# Patient Record
Sex: Female | Born: 1970 | Race: White | Hispanic: No | Marital: Married | Smoking: Current every day smoker
Health system: Southern US, Community
[De-identification: ages and names within clinical notes are randomized; demographics above are authoritative.]

## PROBLEM LIST (undated history)

## (undated) DIAGNOSIS — R232 Flushing: Principal | ICD-10-CM

## (undated) DIAGNOSIS — F32A Depression, unspecified: Secondary | ICD-10-CM

## (undated) DIAGNOSIS — F419 Anxiety disorder, unspecified: Secondary | ICD-10-CM

## (undated) DIAGNOSIS — F329 Major depressive disorder, single episode, unspecified: Secondary | ICD-10-CM

## (undated) DIAGNOSIS — R4586 Emotional lability: Secondary | ICD-10-CM

## (undated) HISTORY — DX: Emotional lability: R45.86

## (undated) HISTORY — DX: Flushing: R23.2

## (undated) HISTORY — PX: KNEE SURGERY: SHX244

## (undated) HISTORY — PX: CHOLECYSTECTOMY: SHX55

---

## 2002-08-31 ENCOUNTER — Emergency Department (HOSPITAL_COMMUNITY): Admission: EM | Admit: 2002-08-31 | Discharge: 2002-08-31 | Payer: Self-pay | Admitting: Internal Medicine

## 2007-10-23 ENCOUNTER — Ambulatory Visit (HOSPITAL_COMMUNITY): Admission: RE | Admit: 2007-10-23 | Discharge: 2007-10-23 | Payer: Self-pay | Admitting: Family Medicine

## 2007-10-30 ENCOUNTER — Emergency Department (HOSPITAL_COMMUNITY): Admission: EM | Admit: 2007-10-30 | Discharge: 2007-10-30 | Payer: Self-pay | Admitting: Emergency Medicine

## 2007-10-30 IMAGING — CR DG FOOT COMPLETE 3+V*R*
3 series · 3 of 3 positions shown · non-contrast
Comparison: none

HISTORY: Pain and swelling

RIGHT FOOT 3 VIEWS:
Soft tissue swelling medially at hindfoot.
Bone mineralization normal.
Joint spaces preserved.
No fracture, dislocation, or bone destruction.

[view not recorded (1 of 3)]
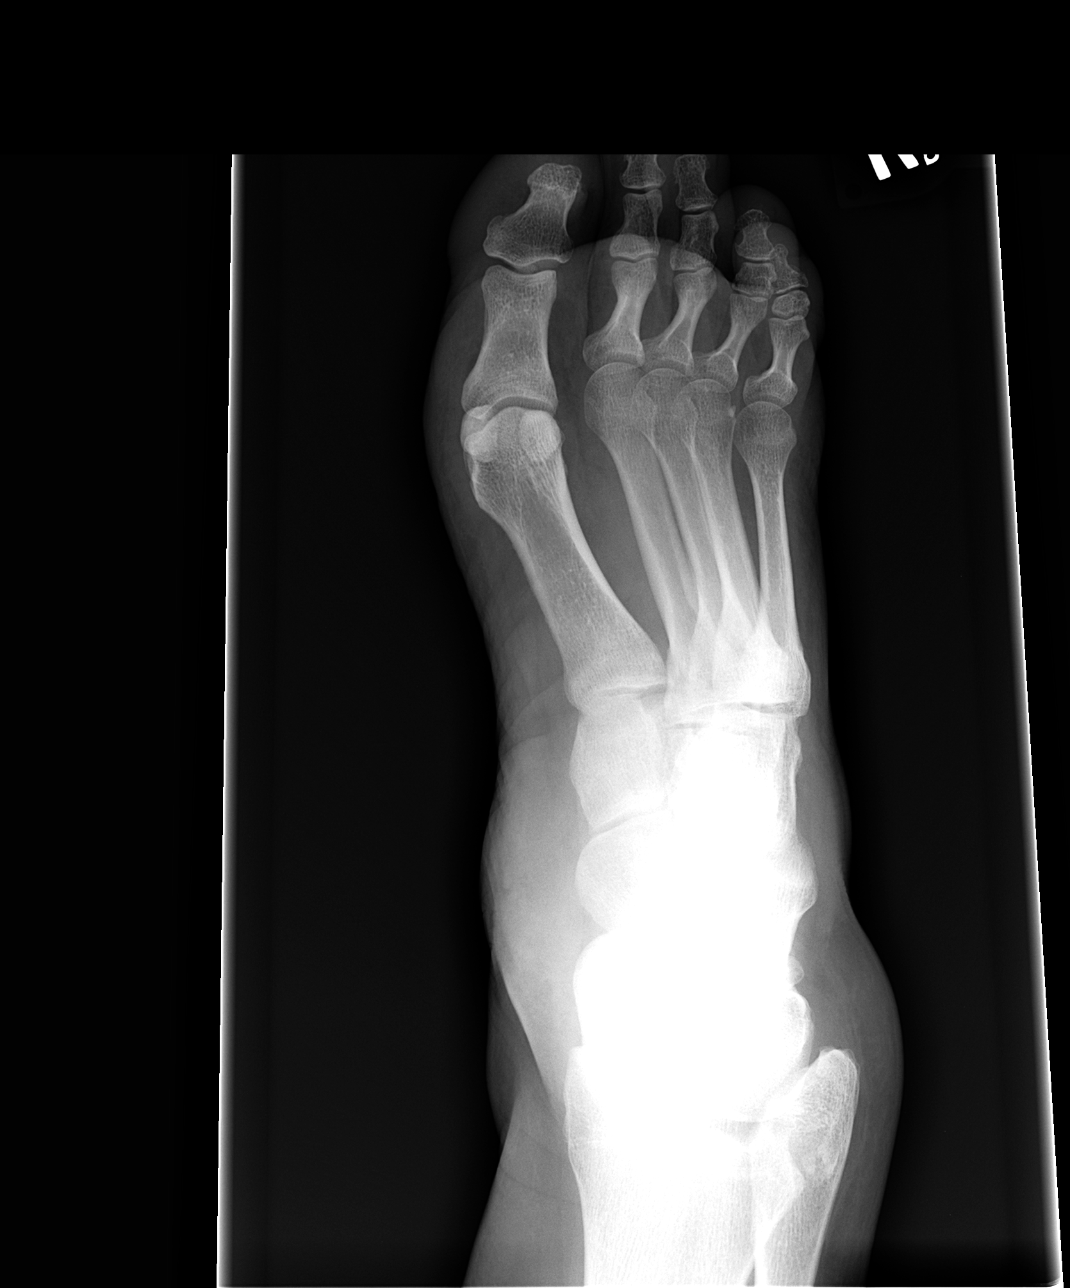

[view not recorded (2 of 3)]
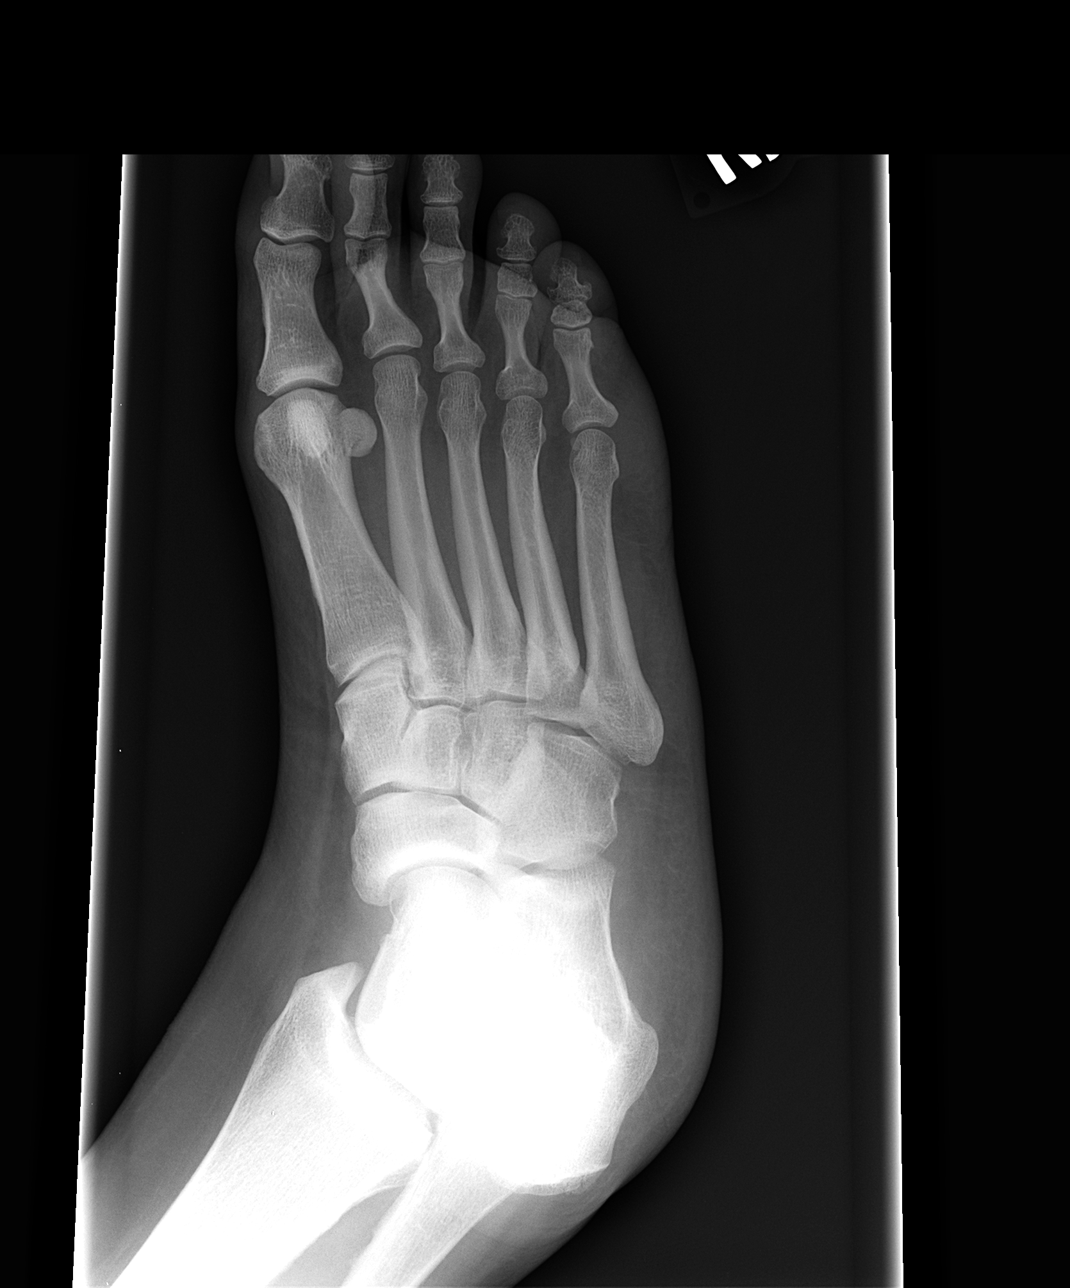

[view not recorded (3 of 3)]
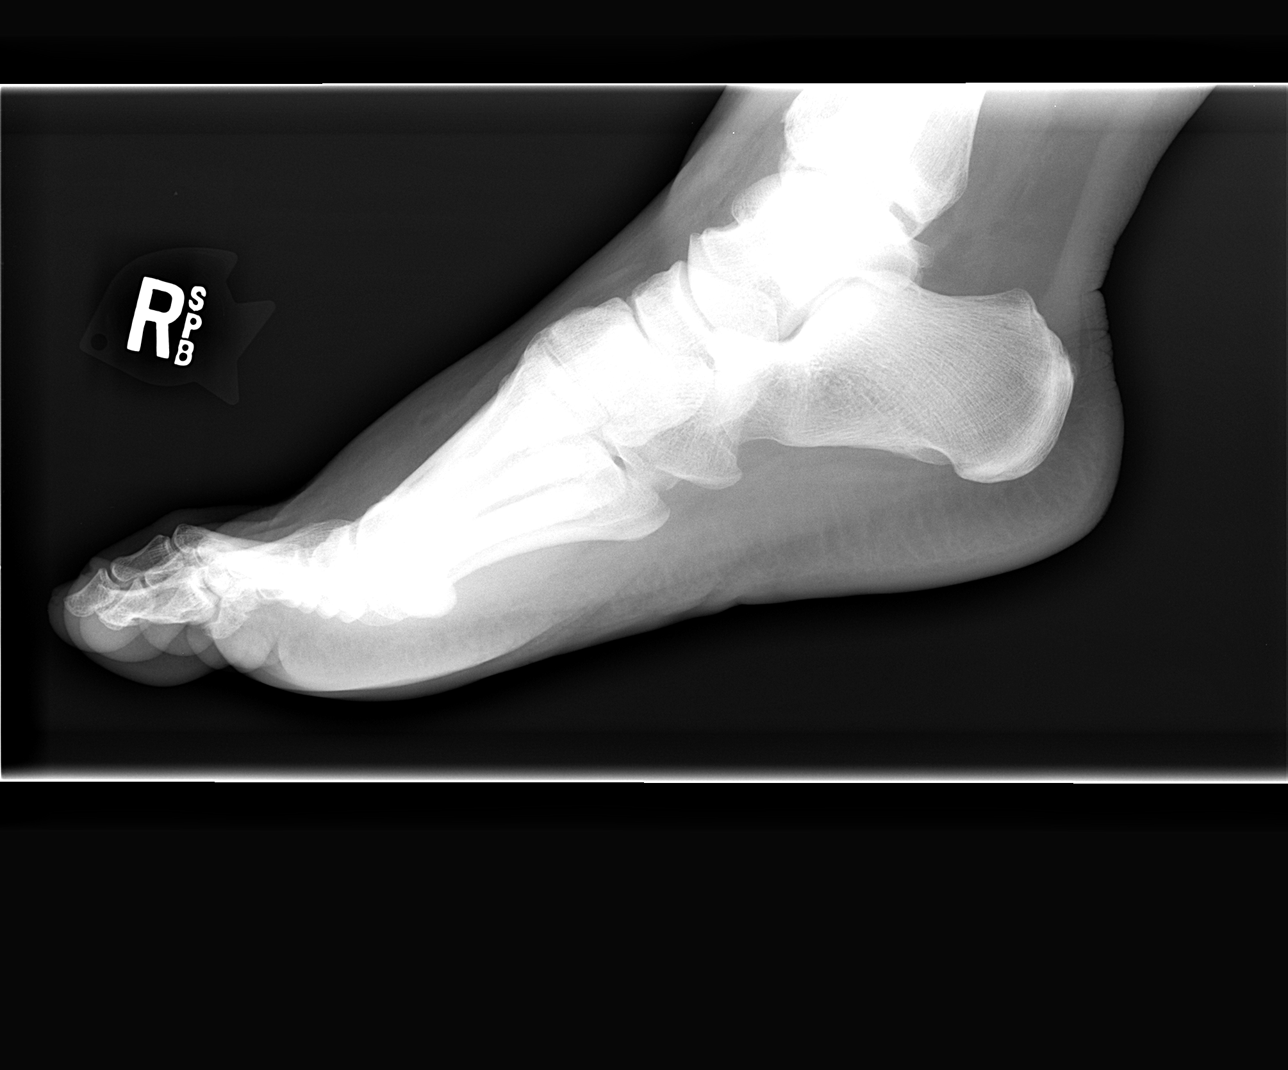

[3 of 3 positions shown; findings below may reference images not displayed]

IMPRESSION: Soft tissue swelling right foot without acute bony abnormality.

RIGHT ANKLE 3 VIEWS:

Soft tissue swelling medially and laterally at right ankle.
Ankle mortise intact.
Bone mineralization normal.
No fracture, dislocation, or bone destruction.
IMPRESSION: No acute bony abnormality right ankle.

## 2007-10-30 IMAGING — CR DG ANKLE COMPLETE 3+V*R*
3 series · 3 of 3 positions shown · non-contrast
Comparison: none

HISTORY: Pain and swelling

RIGHT FOOT 3 VIEWS:
Soft tissue swelling medially at hindfoot.
Bone mineralization normal.
Joint spaces preserved.
No fracture, dislocation, or bone destruction.

[view not recorded (1 of 3)]
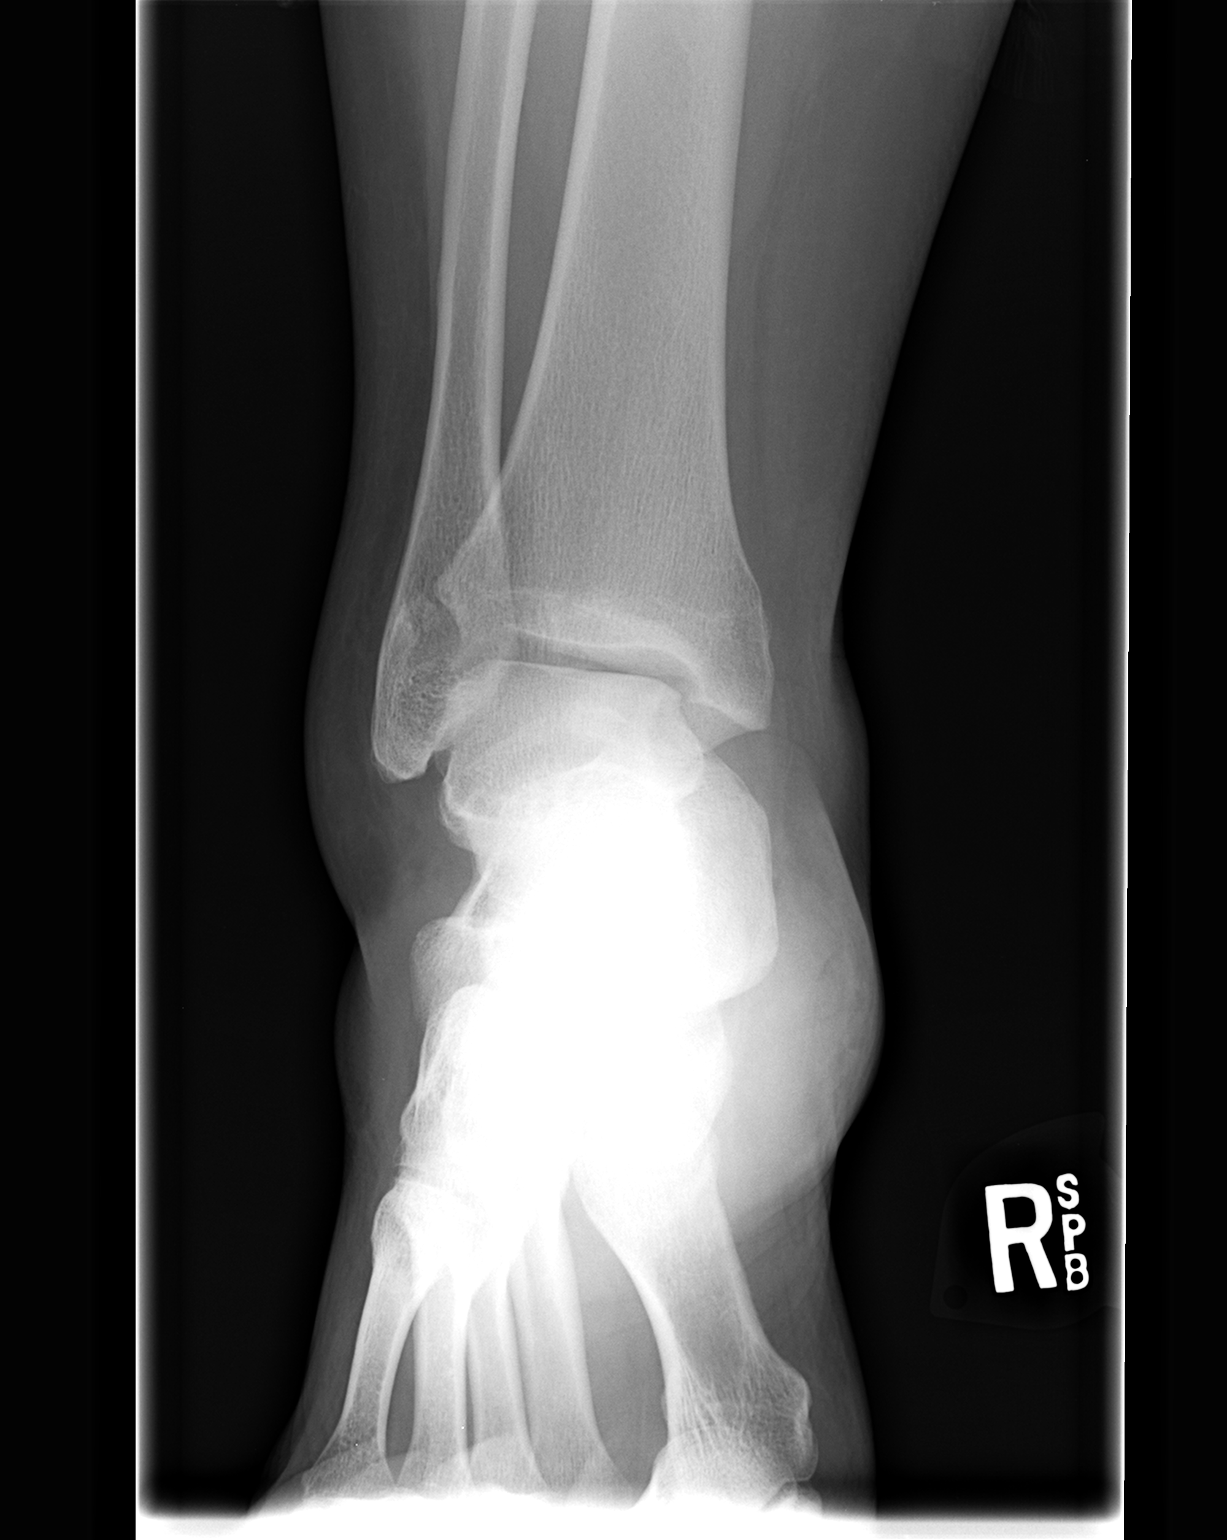

[view not recorded (2 of 3)]
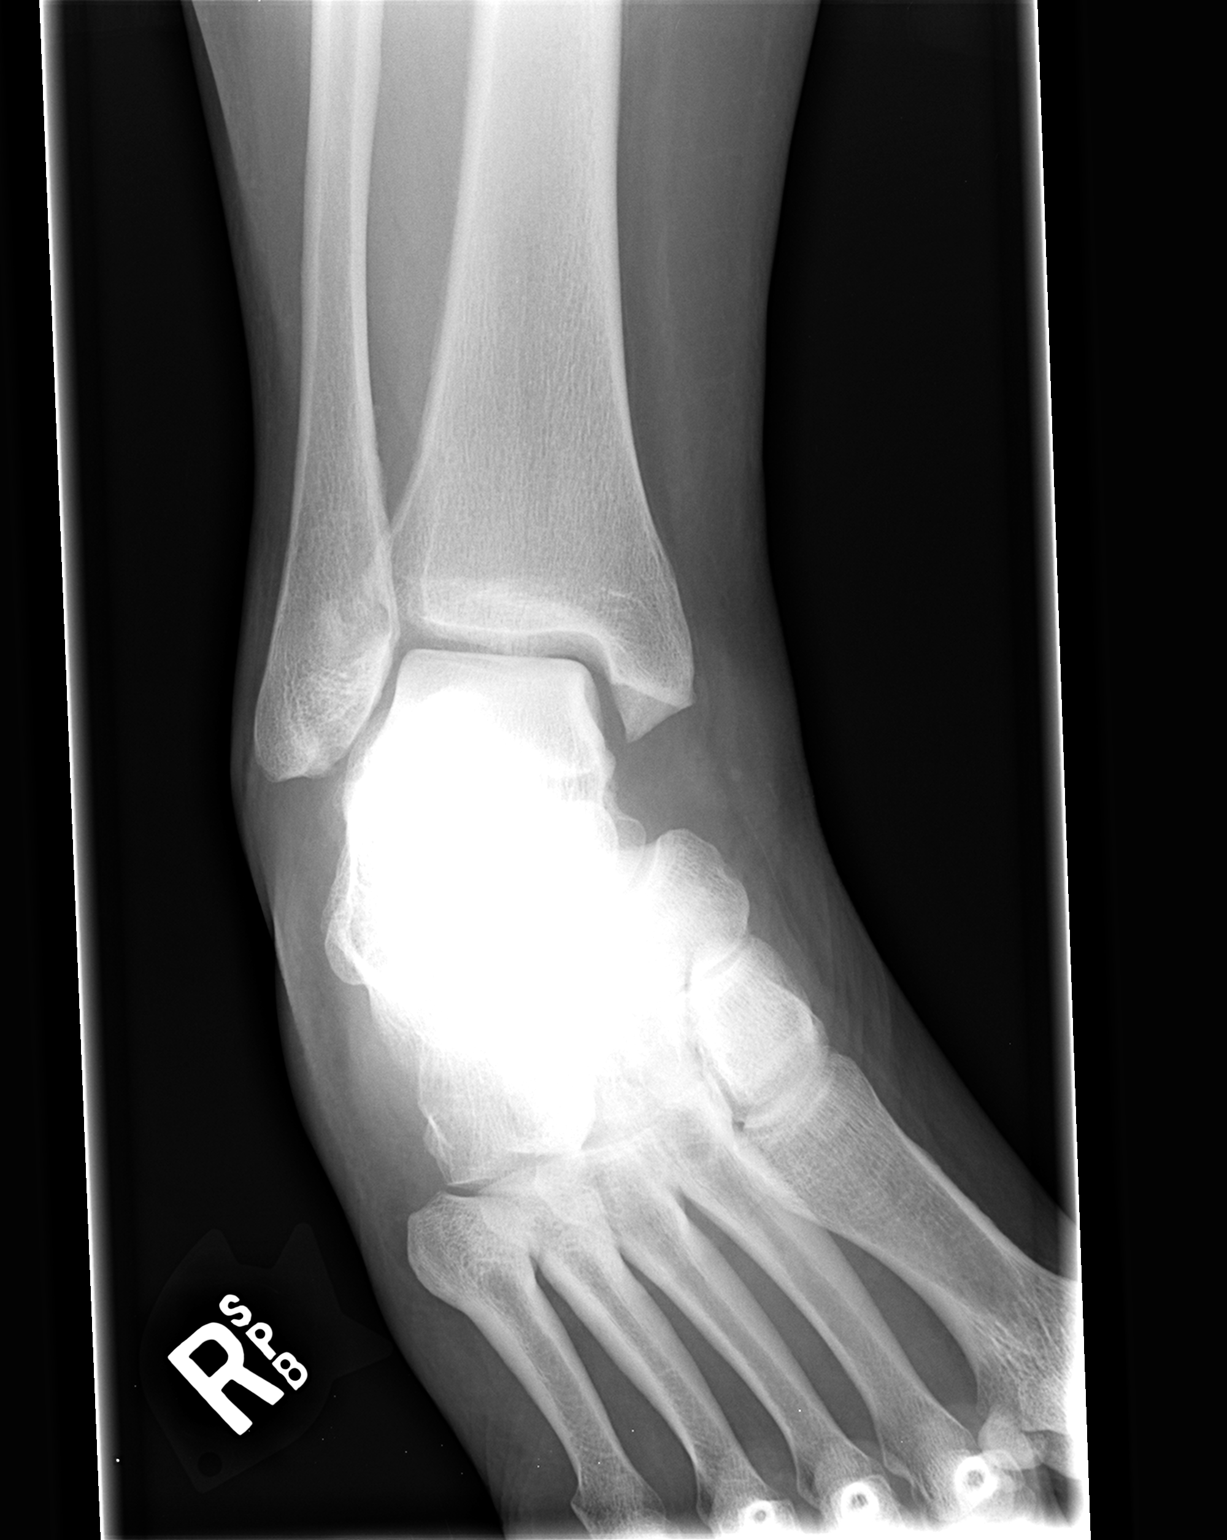

[view not recorded (3 of 3)]
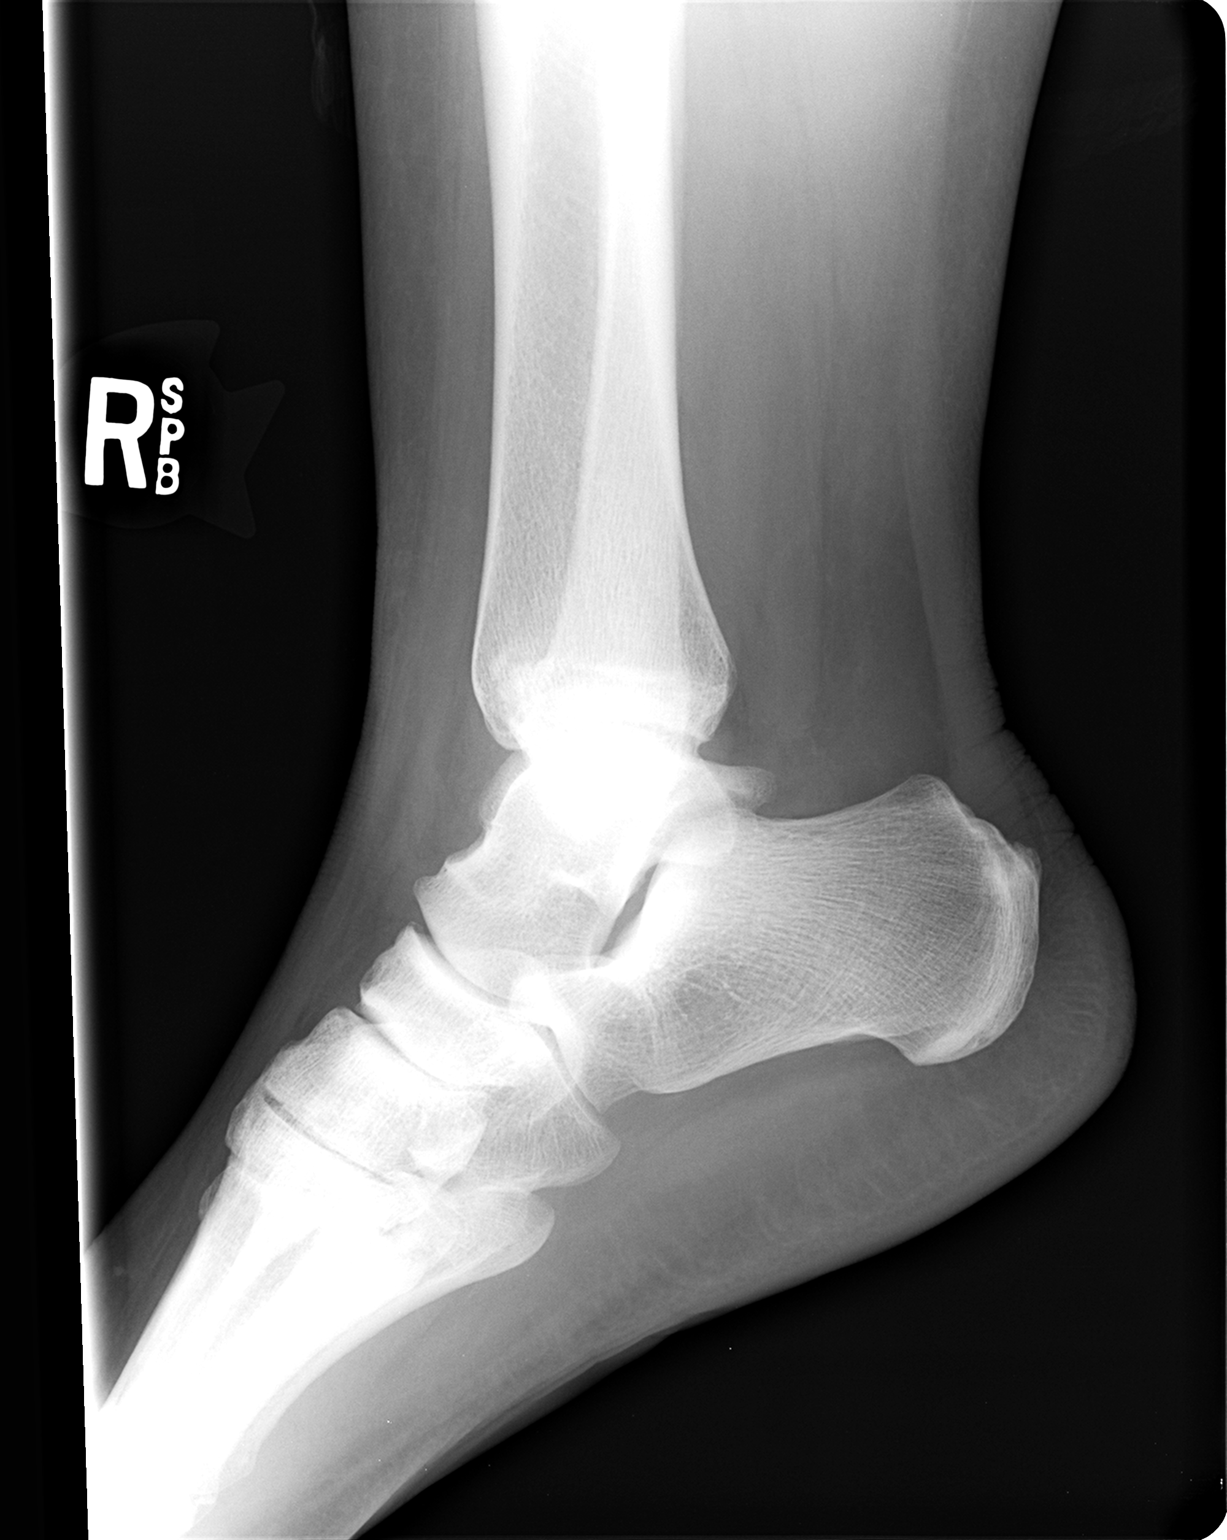

[3 of 3 positions shown; findings below may reference images not displayed]

IMPRESSION: Soft tissue swelling right foot without acute bony abnormality.

RIGHT ANKLE 3 VIEWS:

Soft tissue swelling medially and laterally at right ankle.
Ankle mortise intact.
Bone mineralization normal.
No fracture, dislocation, or bone destruction.
IMPRESSION: No acute bony abnormality right ankle.

## 2009-12-02 ENCOUNTER — Ambulatory Visit (HOSPITAL_COMMUNITY): Admission: RE | Admit: 2009-12-02 | Discharge: 2009-12-02 | Payer: Self-pay | Admitting: Family Medicine

## 2009-12-02 IMAGING — CR DG CHEST 2V
3 series · 3 of 3 positions shown · non-contrast
Comparison: [DATE]

CLINICAL DATA: Cough, right side wheezing

CHEST - 2 VIEW

[view not recorded (1 of 3)]
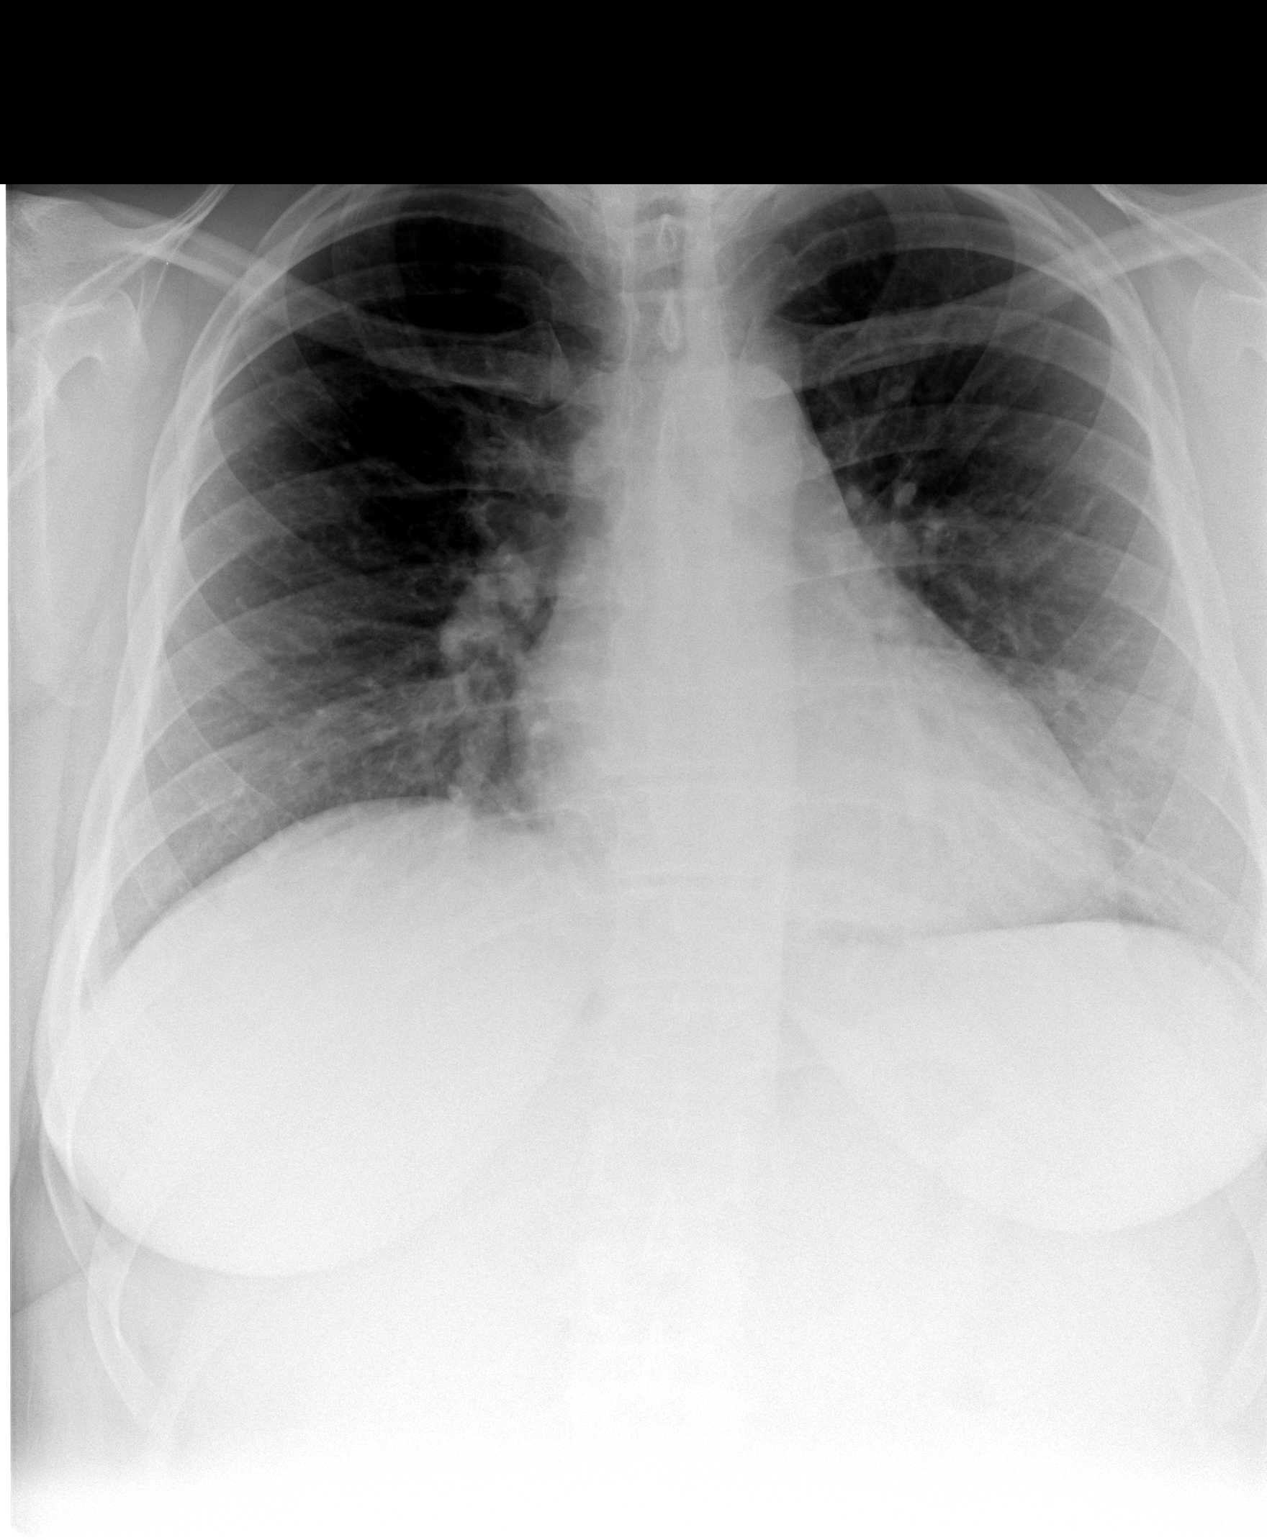

[view not recorded (2 of 3)]
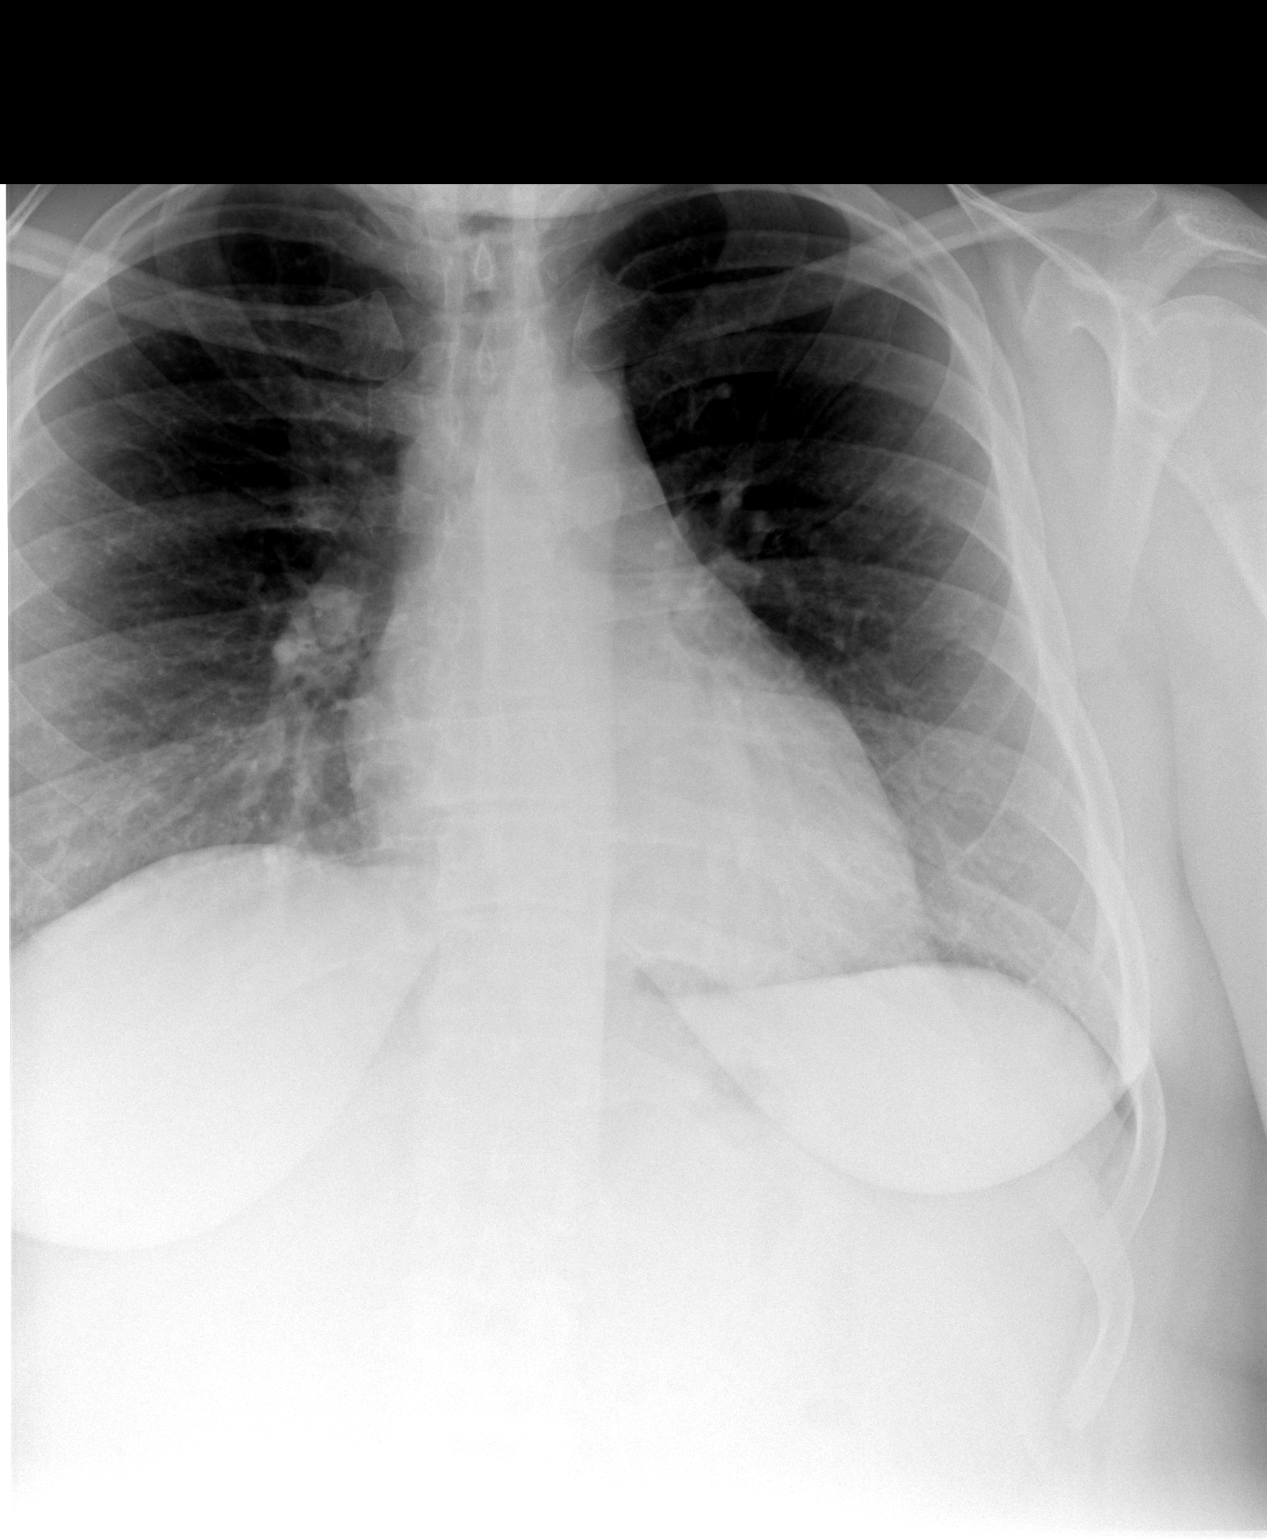

[view not recorded (3 of 3)]
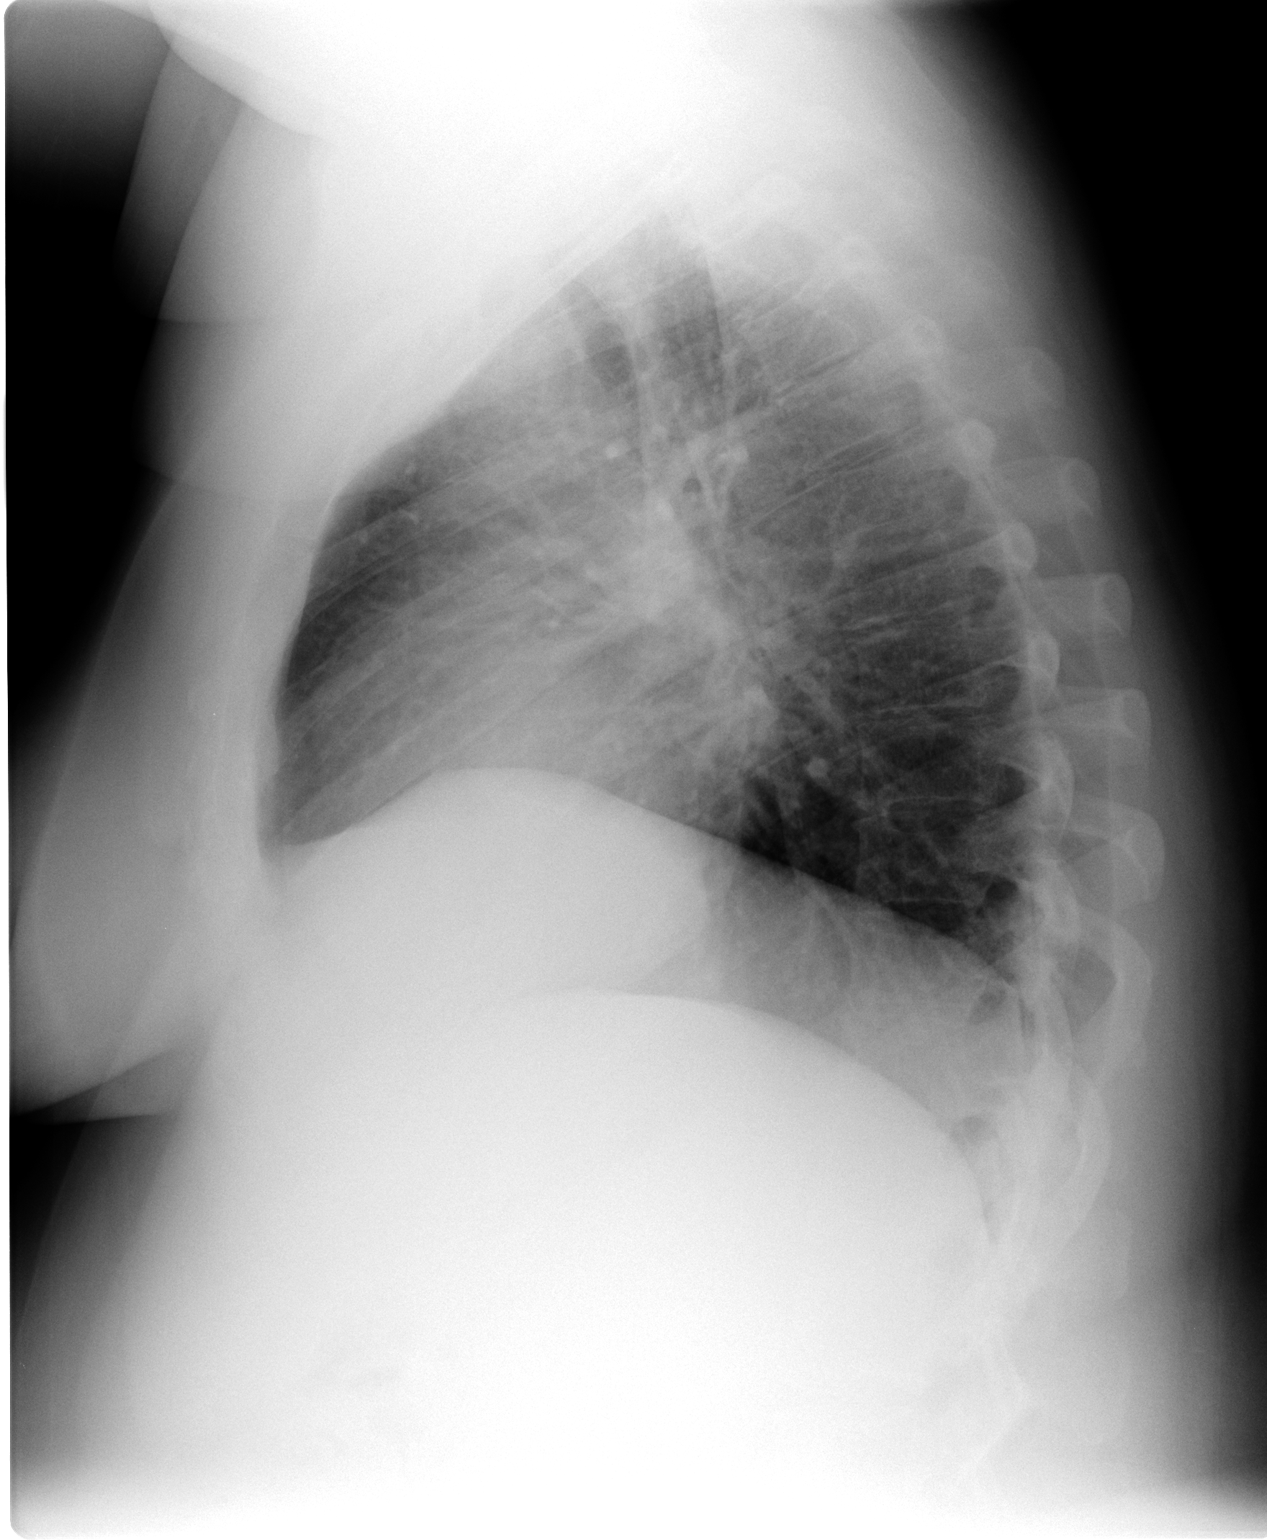

[3 of 3 positions shown; findings below may reference images not displayed]

FINDINGS: Upper normal heart size.
Normal mediastinal contours and pulmonary vascularity.
Minimal chronic peribronchial thickening.
No pulmonary infiltrate or pleural effusion.
No pneumothorax.
Bones unremarkable.
IMPRESSION: Minimal chronic bronchitic changes.

## 2009-12-20 ENCOUNTER — Other Ambulatory Visit: Admission: RE | Admit: 2009-12-20 | Discharge: 2009-12-20 | Payer: Self-pay | Admitting: Obstetrics & Gynecology

## 2010-02-17 ENCOUNTER — Emergency Department (HOSPITAL_COMMUNITY): Admission: EM | Admit: 2010-02-17 | Discharge: 2010-02-17 | Payer: Self-pay | Admitting: Emergency Medicine

## 2010-02-17 IMAGING — CR DG CHEST 2V
2 series · 2 of 2 positions shown · non-contrast
Comparison: [DATE].

CLINICAL DATA: Chest pain.  Shortness of breath.

CHEST - 2 VIEW

[view not recorded (1 of 2)]
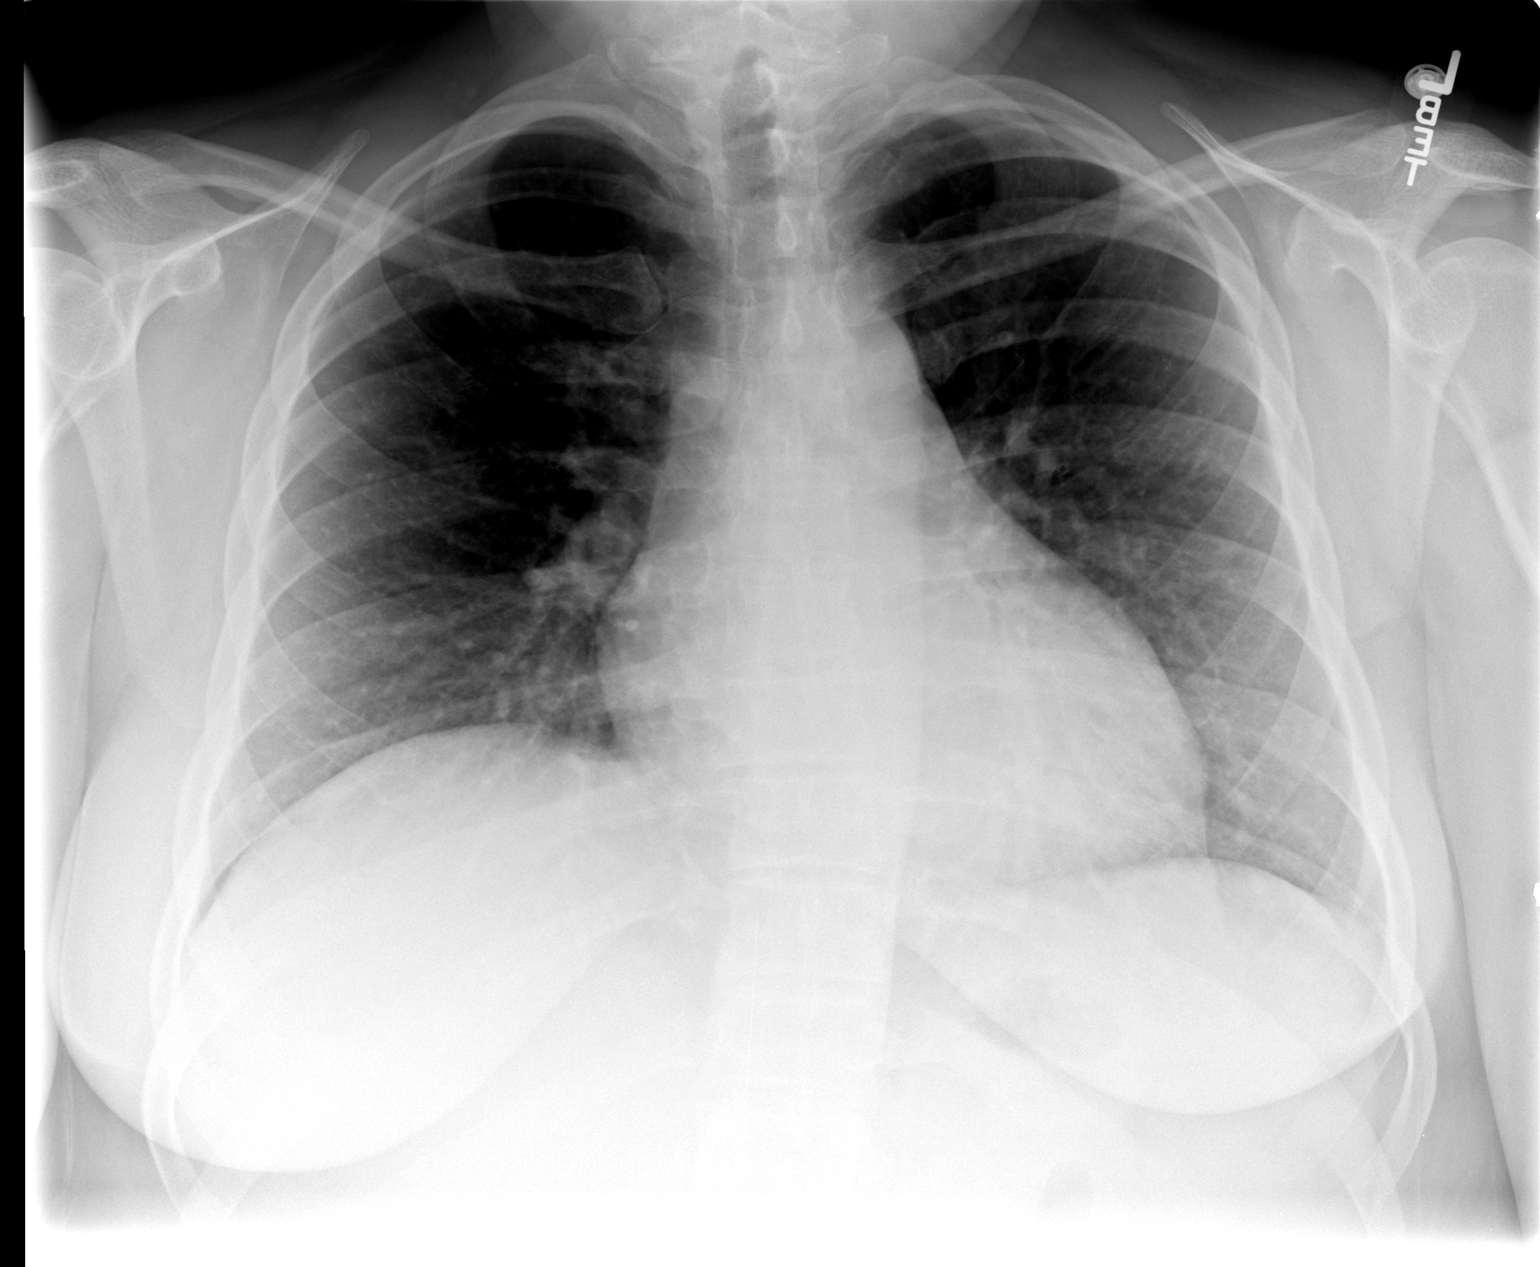

[view not recorded (2 of 2)]
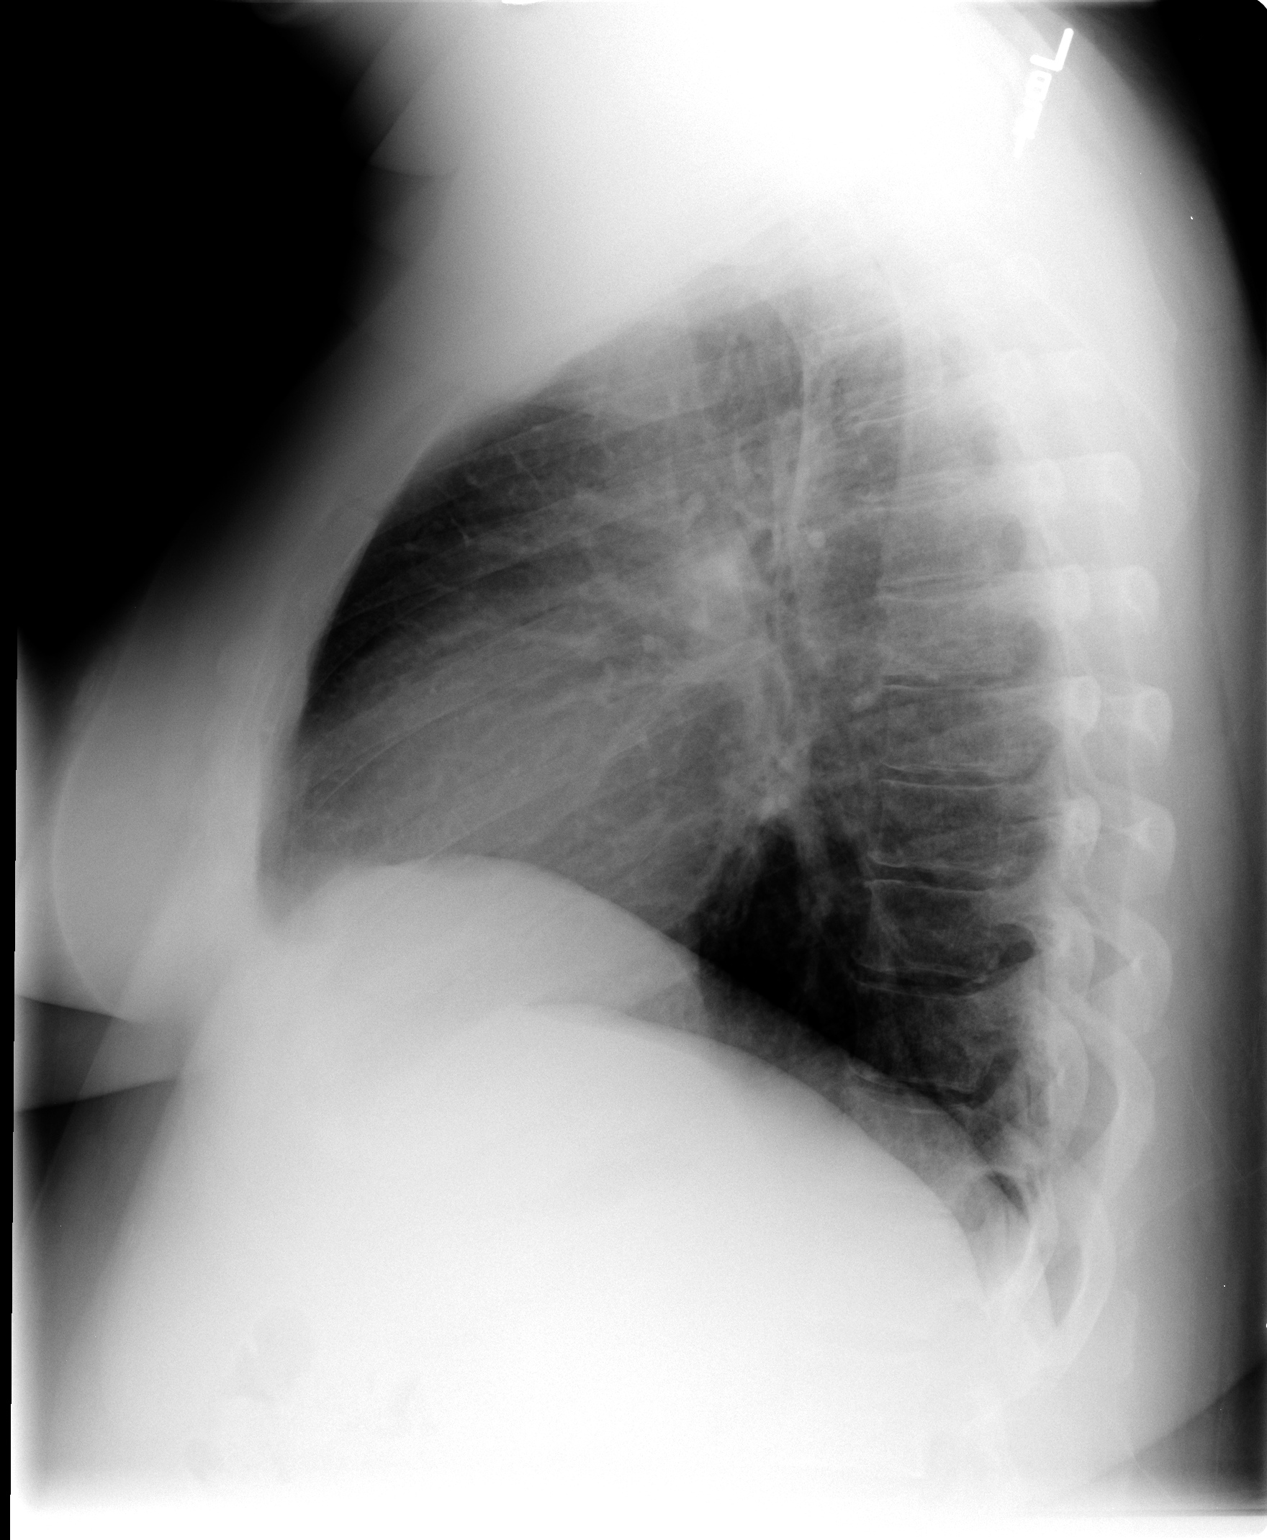

[2 of 2 positions shown; findings below may reference images not displayed]

FINDINGS: The cardiopericardial silhouette is exaggerated by low
lung volumes.  No focal airspace disease is evident.  The
visualized soft tissues and bony thorax are unremarkable.
IMPRESSION: No acute cardiopulmonary disease.

## 2010-08-19 ENCOUNTER — Emergency Department (HOSPITAL_COMMUNITY)
Admission: EM | Admit: 2010-08-19 | Discharge: 2010-08-19 | Payer: Self-pay | Source: Home / Self Care | Admitting: Emergency Medicine

## 2010-08-19 IMAGING — US US ABDOMEN COMPLETE
1 series · 13 of 25 positions shown · non-contrast
Comparison: None

CLINICAL DATA: Abdominal pain, vomiting

ULTRASOUND ABDOMEN:
TECHNIQUE: Sonography of upper abdominal structures was performed.

[Series 1: us abdomen complete · 0.31mm/px · 13 of 89 slices shown]
[im 1/89]
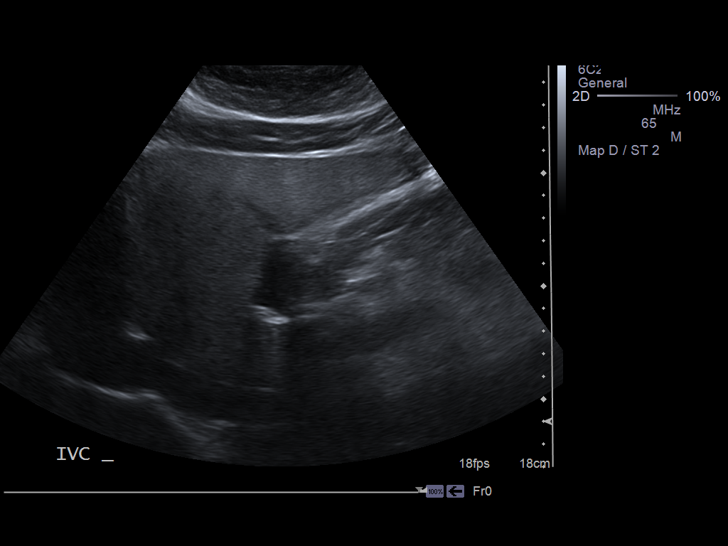
[im 8/89]
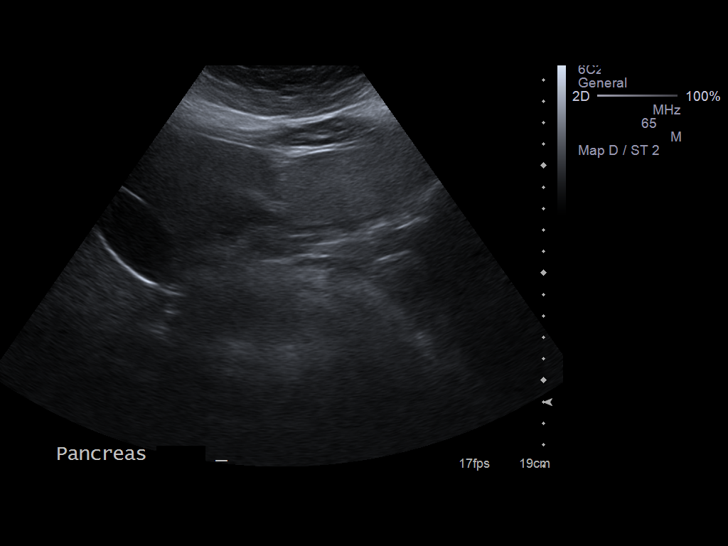
[im 15/89]
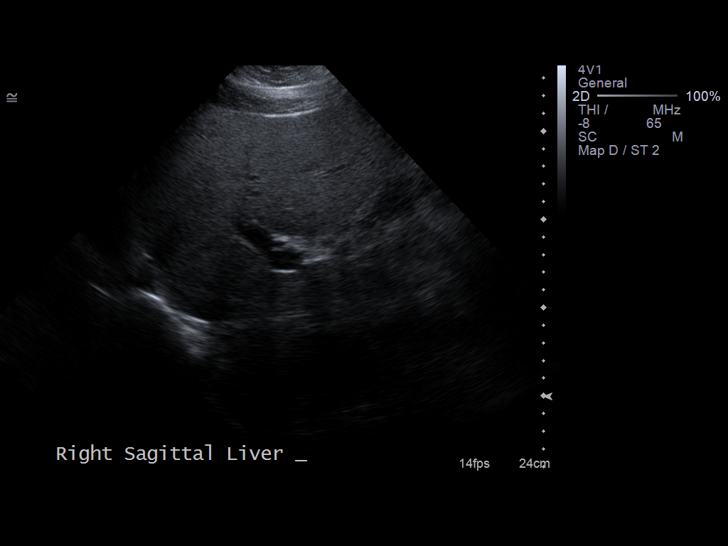
[im 23/89]
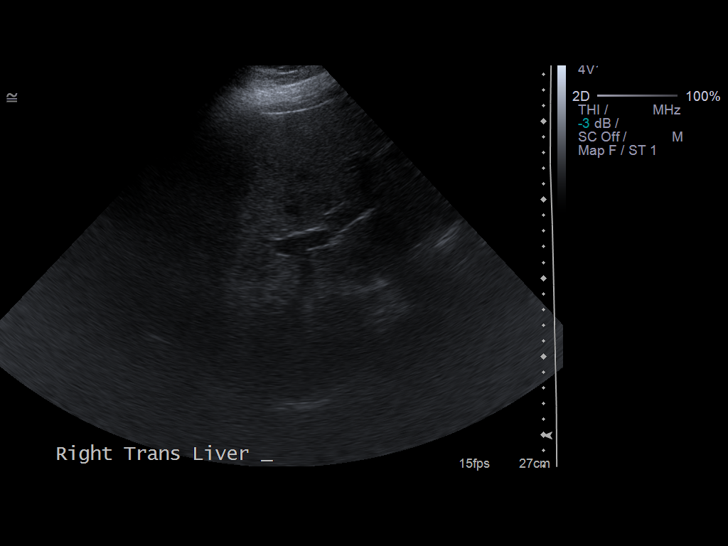
[im 30/89]
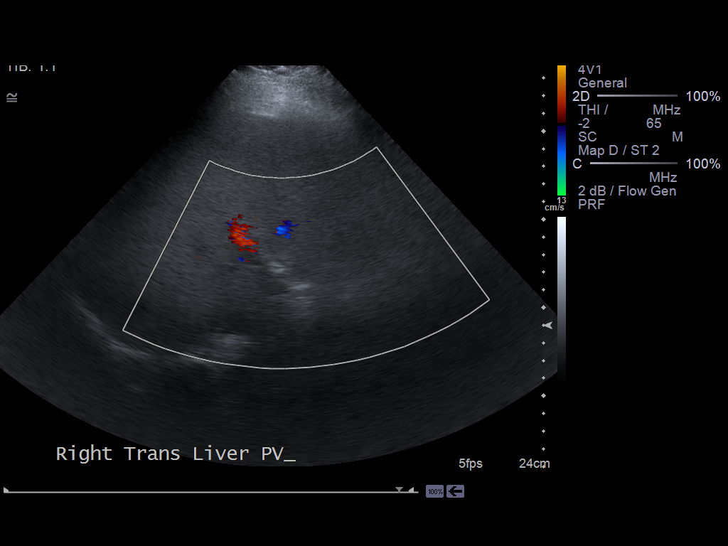
[im 37/89]
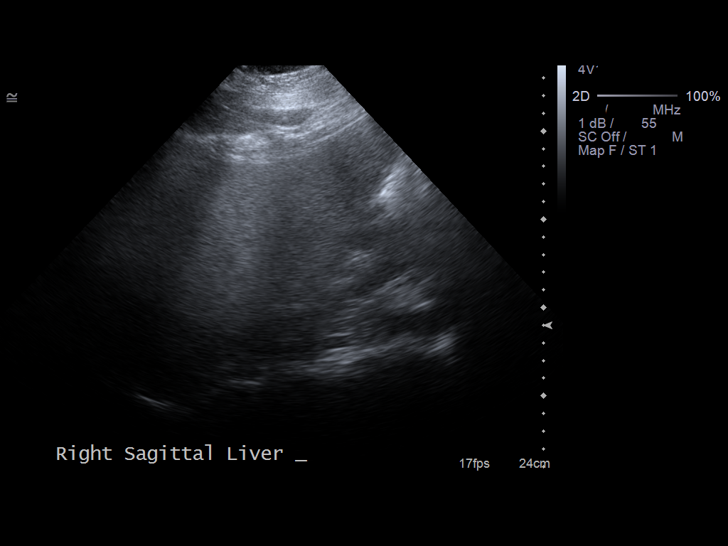
[im 45/89]
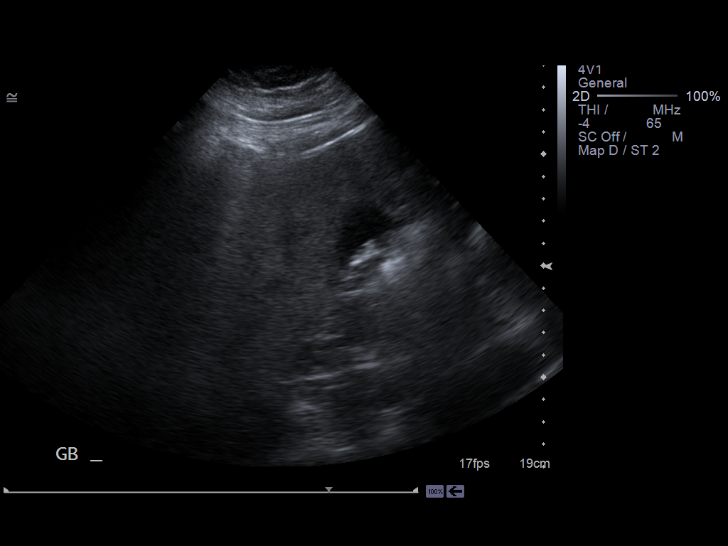
[im 52/89]
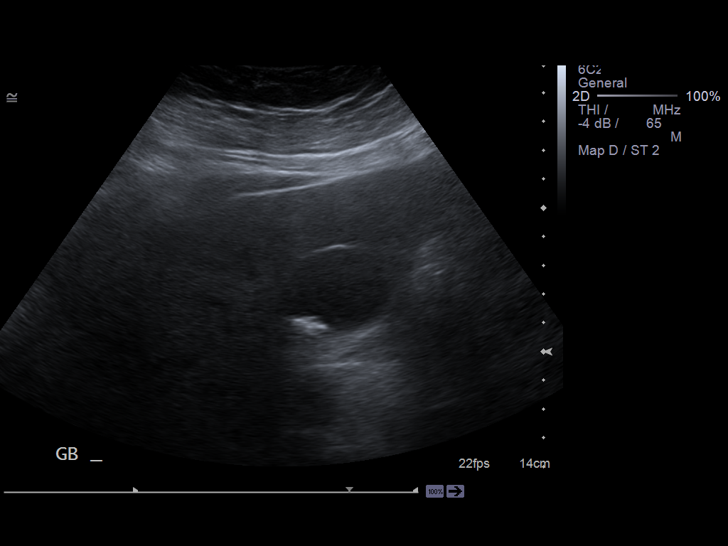
[im 59/89]
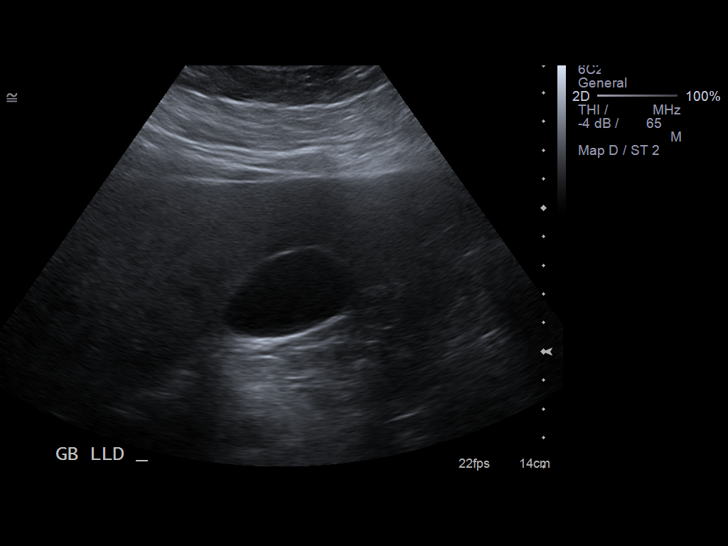
[im 67/89]
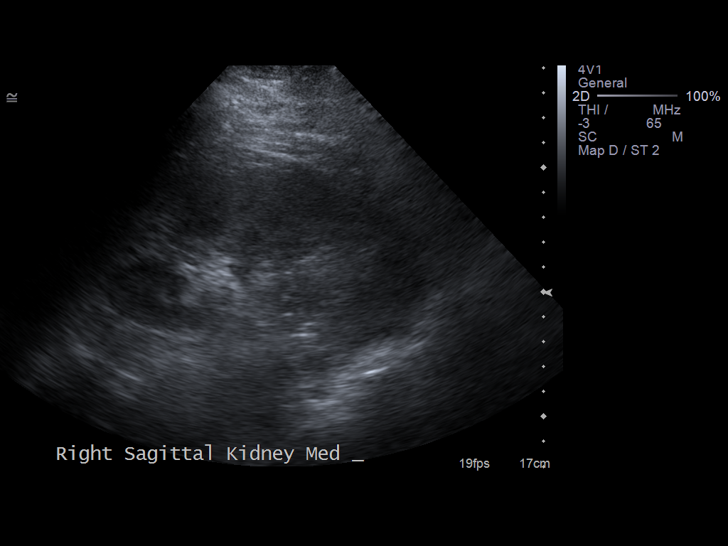
[im 74/89]
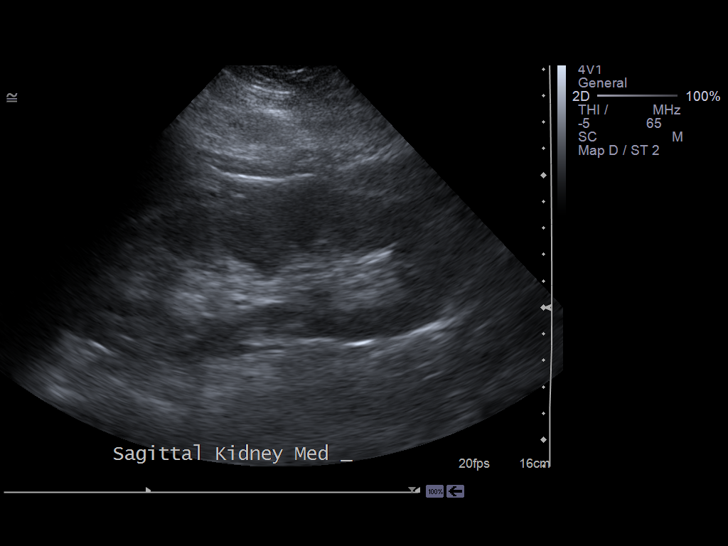
[im 81/89]
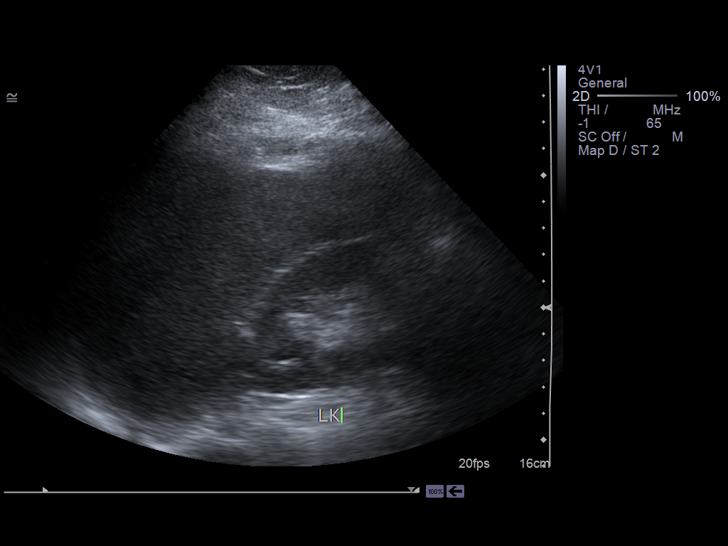
[im 89/89]
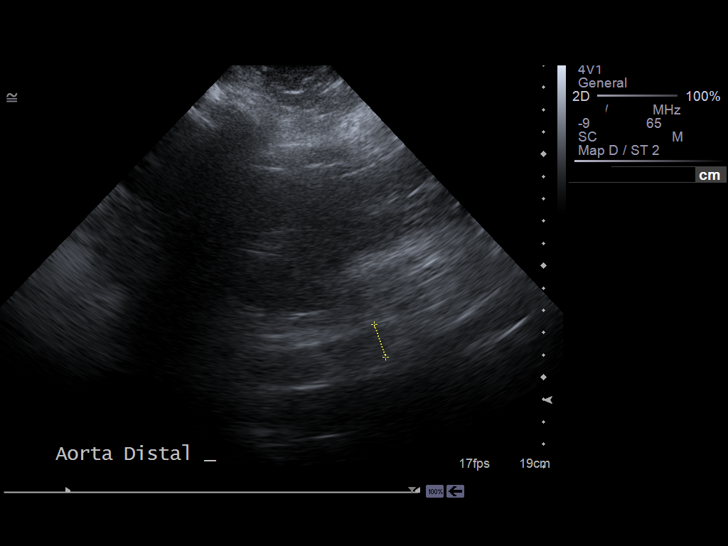

[13 of 25 positions shown; findings below may reference images not displayed]

Gallbladder:  Dependent echogenic foci with minimal scattered
shadowing, question tiny dependent nonshadowing calculi versus
sludge.  No gallbladder wall thickening, pericholecystic fluid, or
sonographic Murphy sign.

Common bile duct:  Normal caliber 2 mm diameter.

Liver:  Echogenic, likely fatty infiltration, though this can be
seen with cirrhosis and certain infiltrative disorders.  Suboptimal
sound penetration through the liver, limiting assessment of
internal detail.  No gross focal hepatic abnormality.

IVC:  Unremarkable

Pancreas:  Inadequately visualized due to bowel gas.

Spleen:  Prominent size at 13.8 cm length with calculated volume of
[BR] ml.  No focal mass mass or nodule.

Right kidney:  13.5 cm length. Normal morphology without mass or
hydronephrosis.

Left kidney:  15.0 cm length. Normal morphology without mass or
hydronephrosis.

Aorta:  Suboptimally visualized due to bowel gas, proximal portion
of abdominal aorta normal appearance.

Other:  No free fluid
IMPRESSION: Probable fatty infiltration of liver.
Suboptimal assessment of intrahepatic detail due to poor sound
transmission.
Dependent nonshadowing echogenic foci within gallbladder, question
tiny nonshadowing calculi versus sludge.
Prominent renal sizes without focal abnormality.
Mild splenomegaly.
Suboptimal visualization of pancreas and aorta as above.

## 2010-08-23 ENCOUNTER — Encounter (HOSPITAL_COMMUNITY)
Admission: RE | Admit: 2010-08-23 | Discharge: 2010-09-22 | Payer: Self-pay | Source: Home / Self Care | Admitting: Emergency Medicine

## 2010-08-23 IMAGING — NM NM HEPATO W/GB/PHARM/[PERSON_NAME]
2 series · 12 of 12 positions shown · non-contrast
Comparison: Ultrasound [DATE].

CLINICAL DATA: Abdominal pain.  Nausea.  Vomiting.

NUCLEAR MEDICINE HEPATOBILIARY IMAGING WITH GALLBLADDER EF
TECHNIQUE: Sequential images of the abdomen were obtained [DATE]
minutes following intravenous administration of
radiopharmaceutical. After slow intravenous infusion of 3.0 uCg
Cholecystokinin, gallbladder ejection fraction was determined.
Radiopharmaceutical: 4.7 mCi [FO] Choletec

[Series 1: hida · 3.20mm/px · 6 of 30 frames shown (1 of 2)]
[frame 3/30]
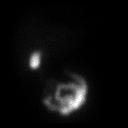
[frame 8/30]
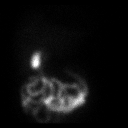
[frame 13/30]
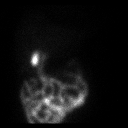
[frame 18/30]
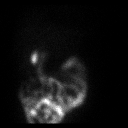
[frame 23/30]
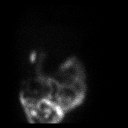
[frame 28/30]
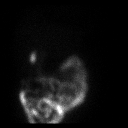

[Series 1: hida · 3.20mm/px · 6 of 60 frames shown (2 of 2)]
[frame 6/60]
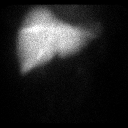
[frame 16/60]
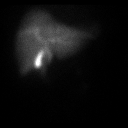
[frame 26/60]
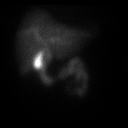
[frame 36/60]
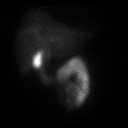
[frame 46/60]
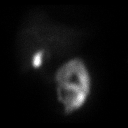
[frame 56/60]
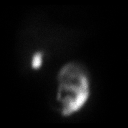

[12 of 12 positions shown; findings below may reference images not displayed]

FINDINGS: There is prompt visualization of hepatic activity. There
is prompt visualization of the common bile duct. Subsequently
intestinal activity was identified.

The gallbladder began to visualize at 9 minutes.

During CCK infusion the gallbladder contracted 78.7%. 30% or
greater is normal range.

During CCK infusion the patient reported  experiencing abdominal
pain and pressure sensation in the abdomen.
IMPRESSION: There is demonstration of patency of the common bile duct and the
cystic duct. There is no evidence of cholecystitis.

During CCK infusion the gallbladder contracted 78.7%. 30% or
greater is normal range.

During CCK infusion the patient reported  experiencing abdominal
pain and pressure sensation in the abdomen.

## 2010-09-02 ENCOUNTER — Ambulatory Visit (HOSPITAL_COMMUNITY)
Admission: RE | Admit: 2010-09-02 | Discharge: 2010-09-02 | Payer: Self-pay | Source: Home / Self Care | Admitting: General Surgery

## 2010-09-02 IMAGING — CR DG UGI W/ HIGH DENSITY W/KUB
4 series · 4 of 4 positions shown · non-contrast
Comparison: No similar prior study is available for comparison.

CLINICAL DATA: Sensation of food sticking, difficulty swallowing.
Abdominal pain.  Preoperative for cholecystectomy.

UPPER GI SERIES WITH KUB
TECHNIQUE: Routine upper GI series was performed with thin and
high density barium.
Fluoroscopy Time: 3.1 minutes

[view not recorded (1 of 4)]
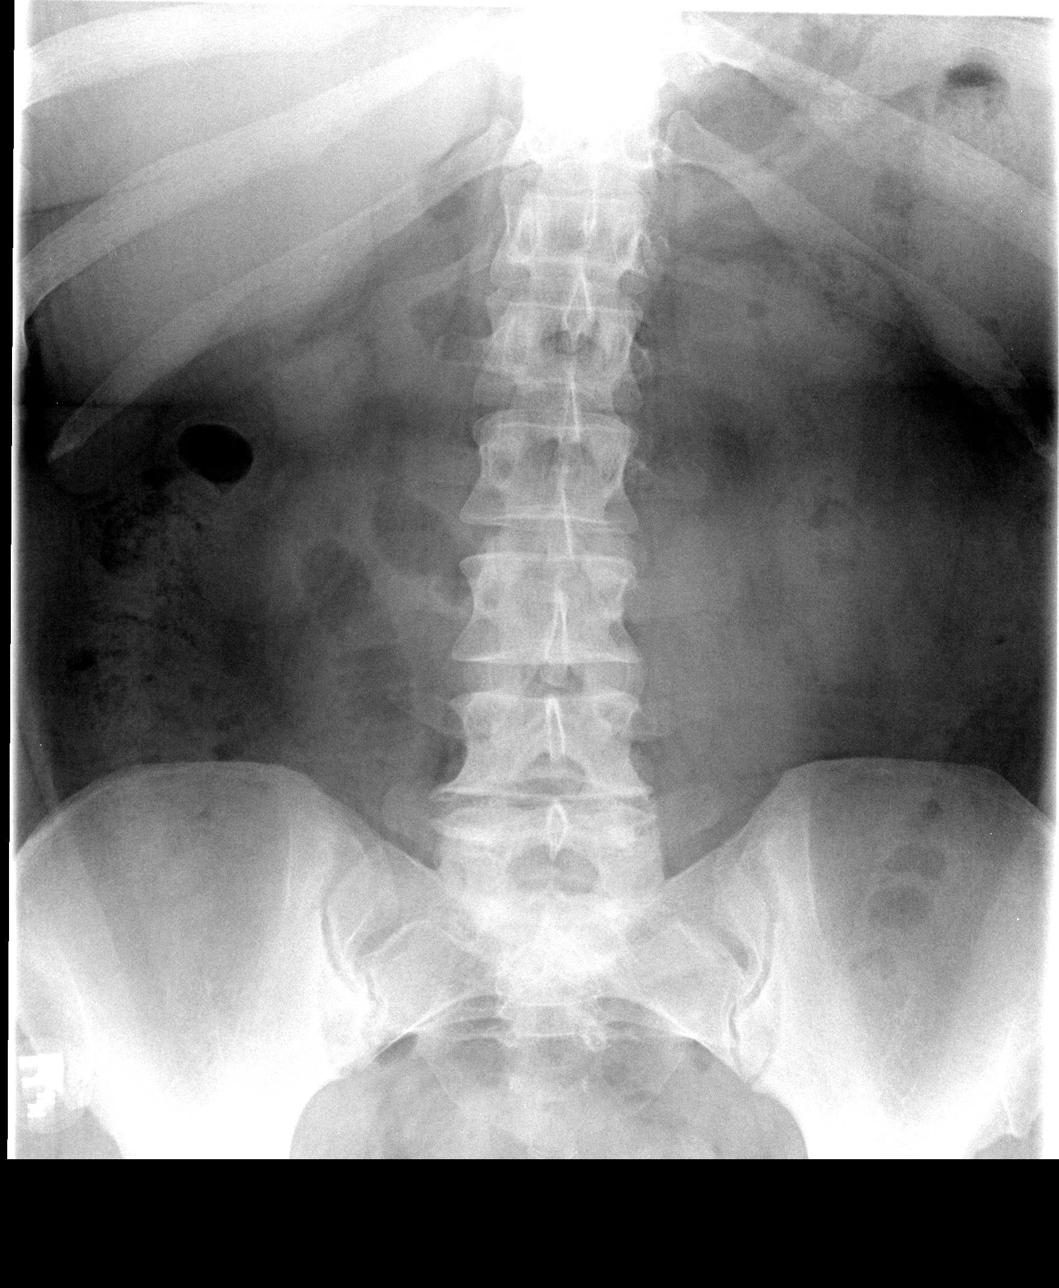

[view not recorded (2 of 4)]
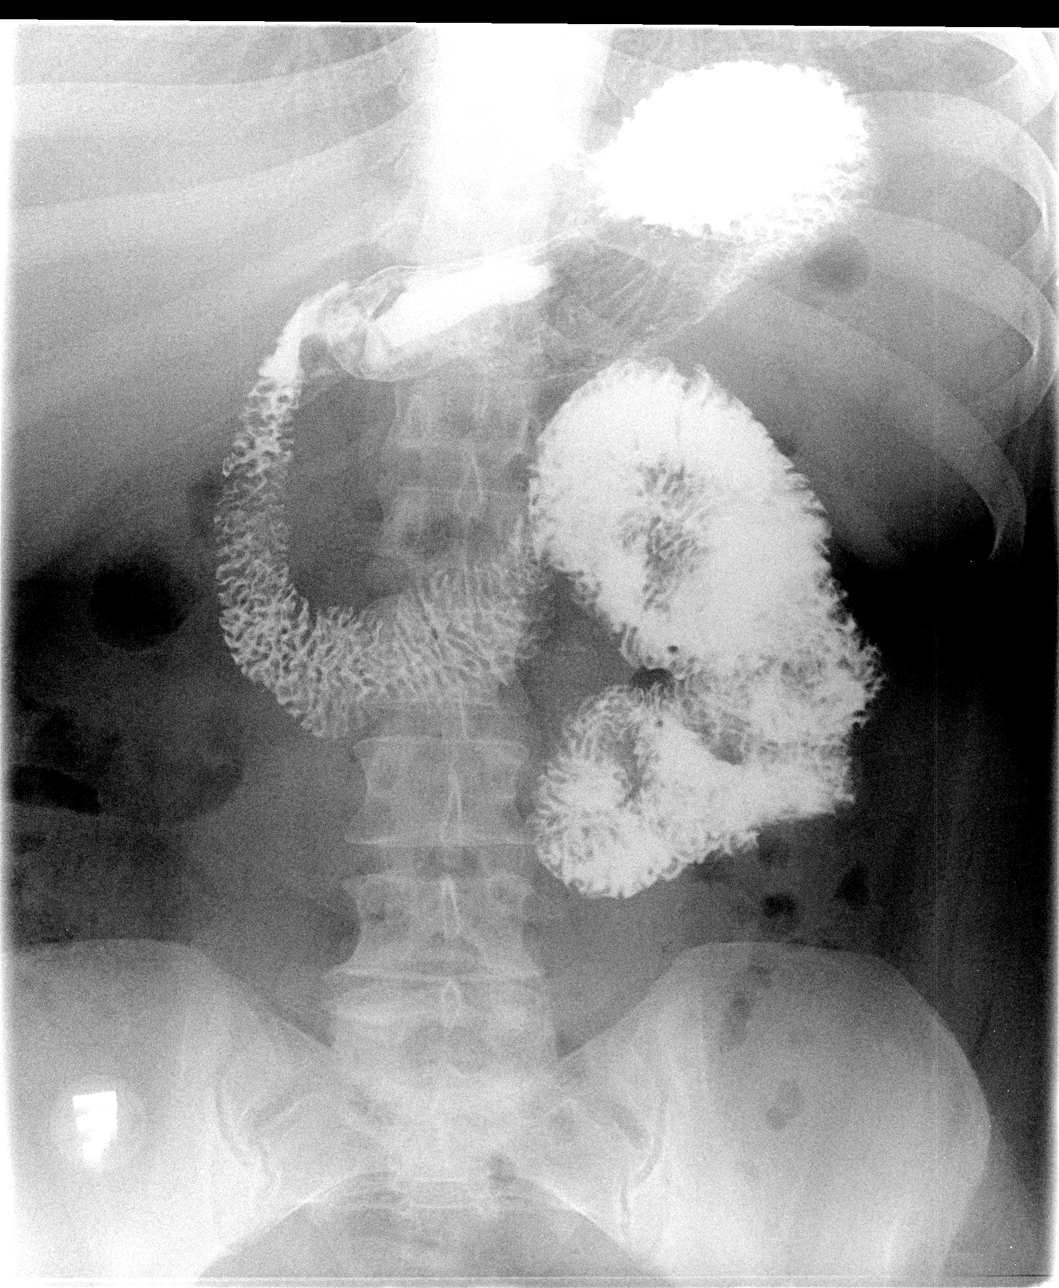

[view not recorded (3 of 4)]
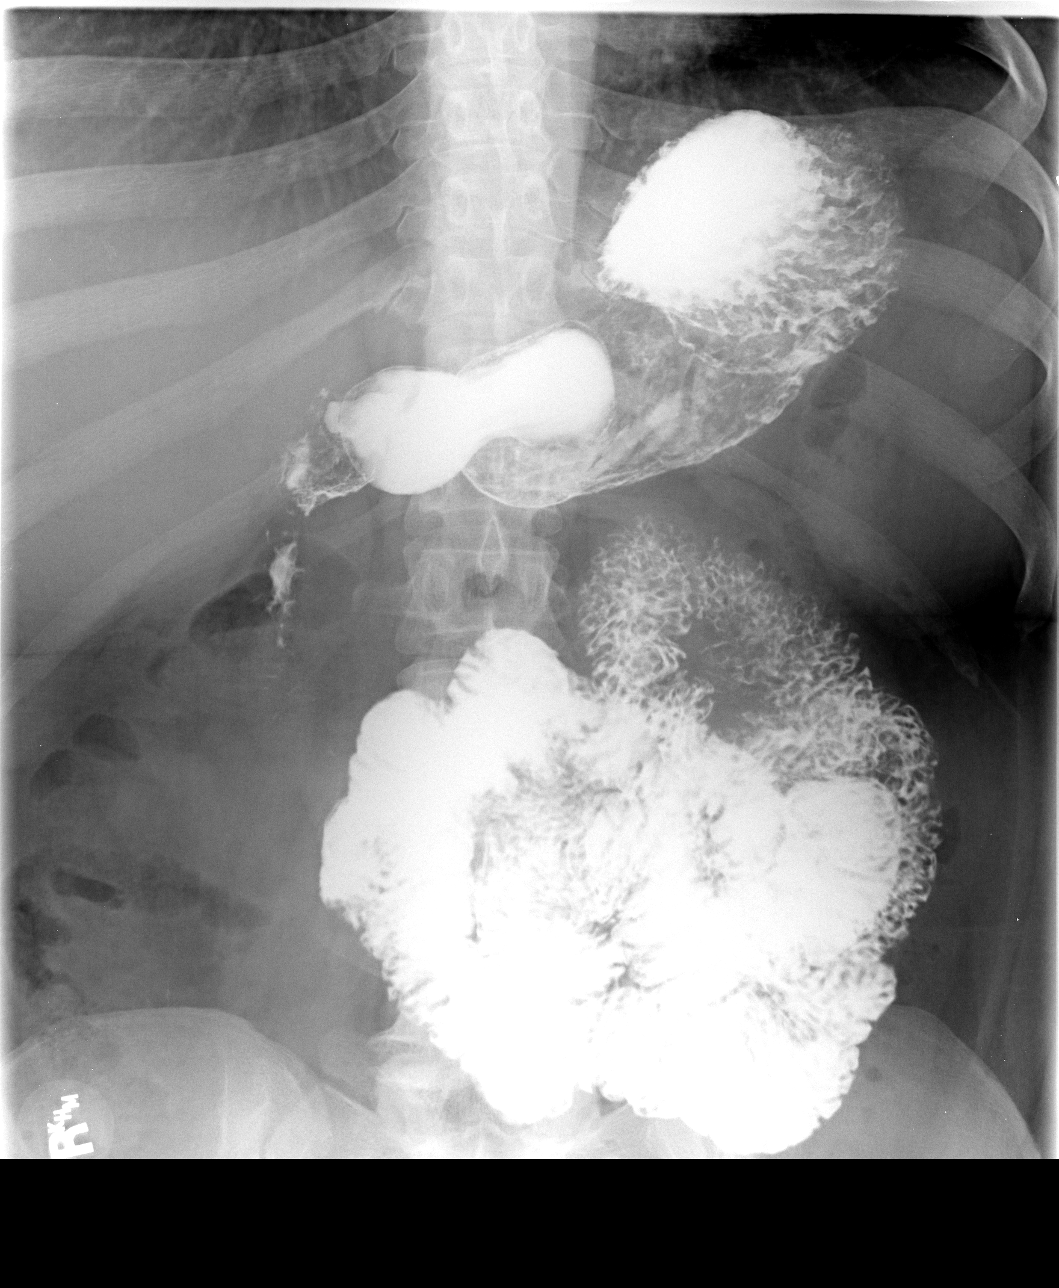

[view not recorded (4 of 4)]
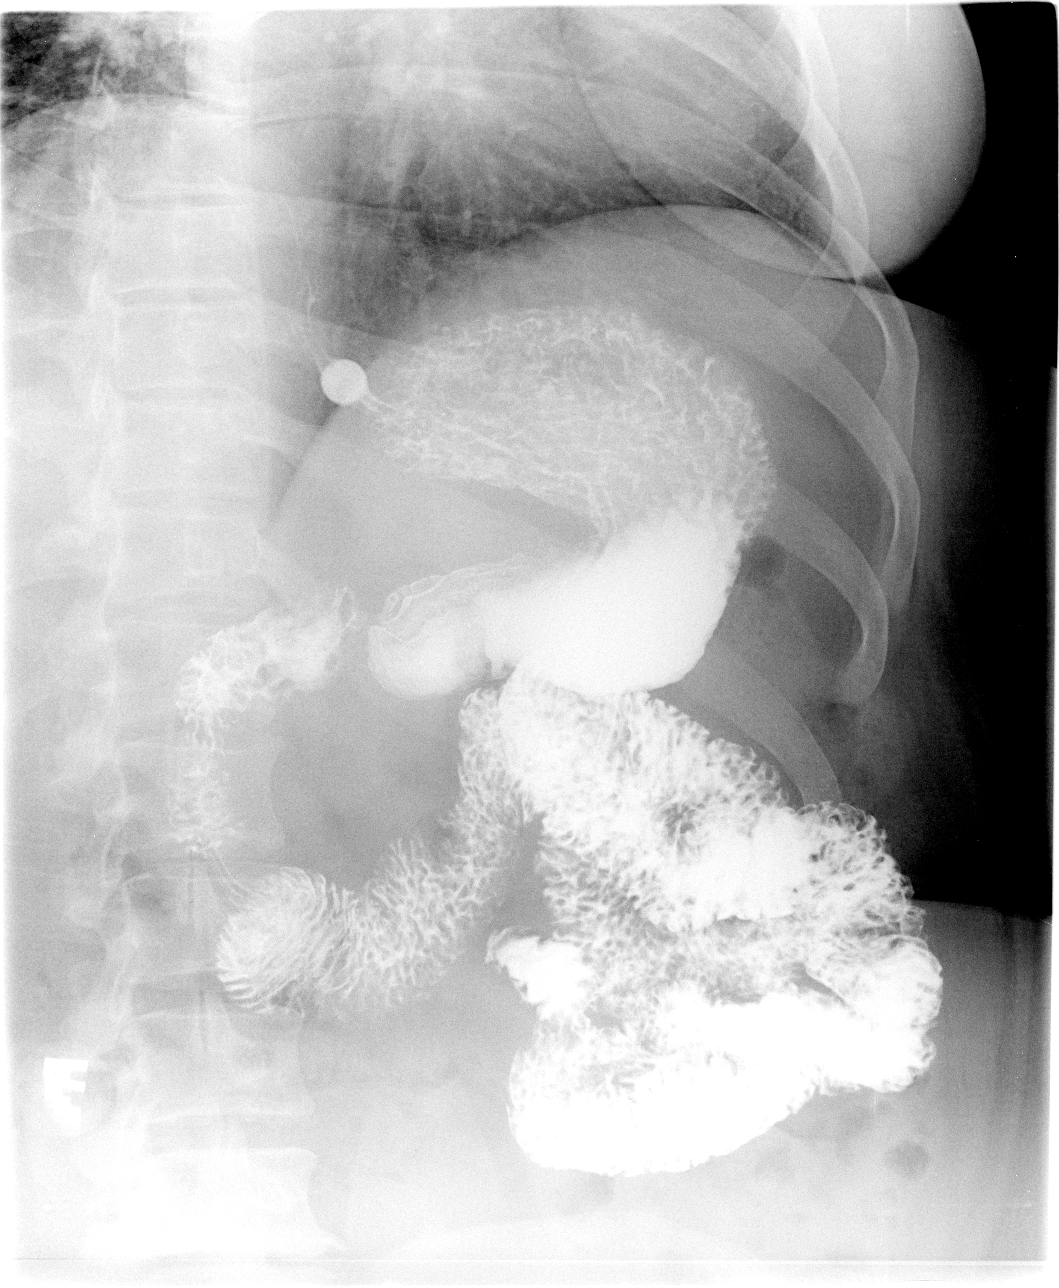

[4 of 4 positions shown; findings below may reference images not displayed]

FINDINGS: Preliminary abdominal radiograph demonstrates normal-
appearing bowel gas pattern.  No abnormal calcific opacity.  No
acute osseous abnormality.

The esophagus is normal in course and contour.  The piriform
sinuses and valleculae are normal.  Minimal change of feline
esophagus is incidentally noted in the upper esophagus.  No focal
ulceration or intrinsic/extrinsic mass lesion is identified.
Spontaneous reflux was observed.  There was no holdup to passage of
the barium tablet until the point of the gastroesophageal junction.
Despite attempts at drinking water and barium, the tablet did pass
until approximately 30 minutes had passed.

The ligament of Treitz is properly located.  The stomach is normal
in appearance.  Duodenal bulb distended normally with contrast on
real time imaging but could not be well documented with
fluoroscopic images.
IMPRESSION: Prolonged holdup of the barium tablet at the level of the
gastroesophageal junction, but no intrinsic or extrinsic mass
lesion can be identified at this location.

No intrinsic or extrinsic abnormality within the esophagus,
stomach, or duodenal bulb.

Spontaneous reflux observed.

## 2010-09-02 IMAGING — RF DG ESOPHAGUS
15 of 19 series · 15 of 19 positions shown · non-contrast
Comparison: No similar prior study is available for comparison.

CLINICAL DATA: Sensation of food sticking, difficulty swallowing.
Abdominal pain.  Preoperative for cholecystectomy.

UPPER GI SERIES WITH KUB
TECHNIQUE: Routine upper GI series was performed with thin and
high density barium.
Fluoroscopy Time: 3.1 minutes

[Series 1: run · 1 of 1 slices shown (1 of 15)]
[im 1/1]
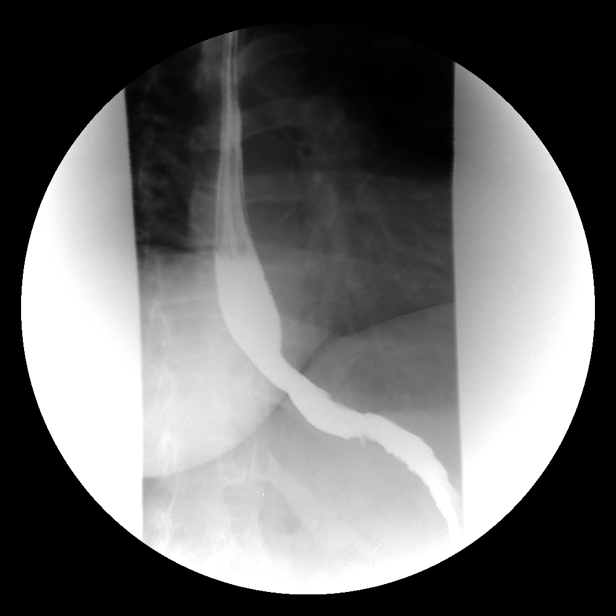

[Series 2: run · 1 of 1 slices shown (2 of 15)]
[im 1/1]
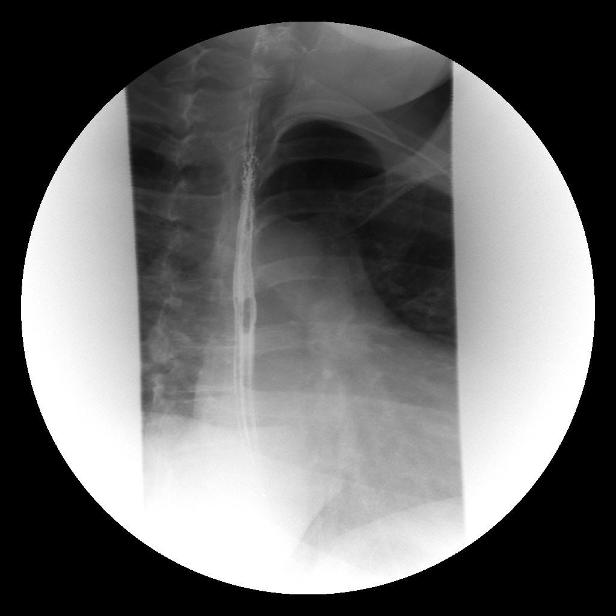

[Series 4: run · 1 of 1 slices shown (3 of 15)]
[im 1/1]
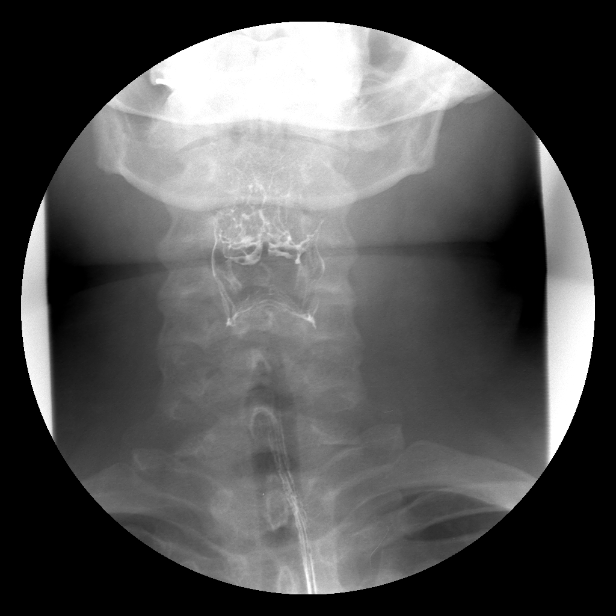

[Series 5: run · 1 of 1 slices shown (4 of 15)]
[im 1/1]
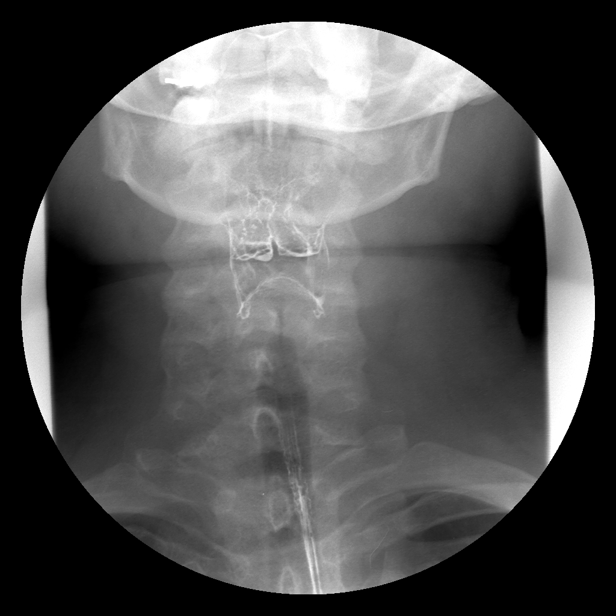

[Series 6: run · 1 of 1 slices shown (5 of 15)]
[im 1/1]
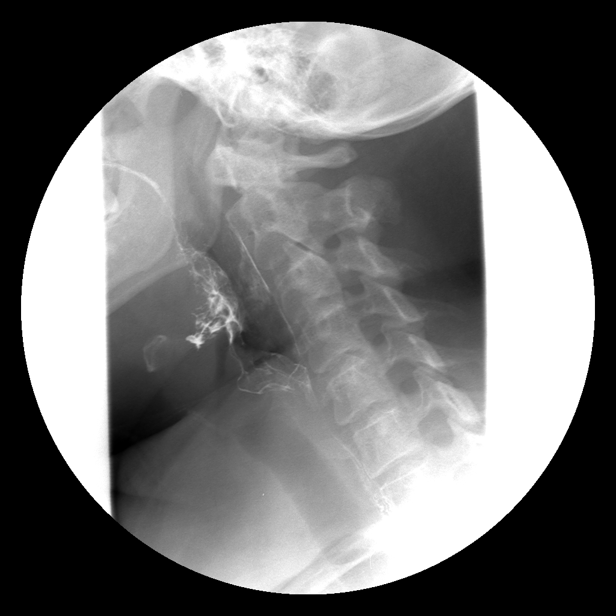

[Series 7: run · 1 of 1 slices shown (6 of 15)]
[im 1/1]
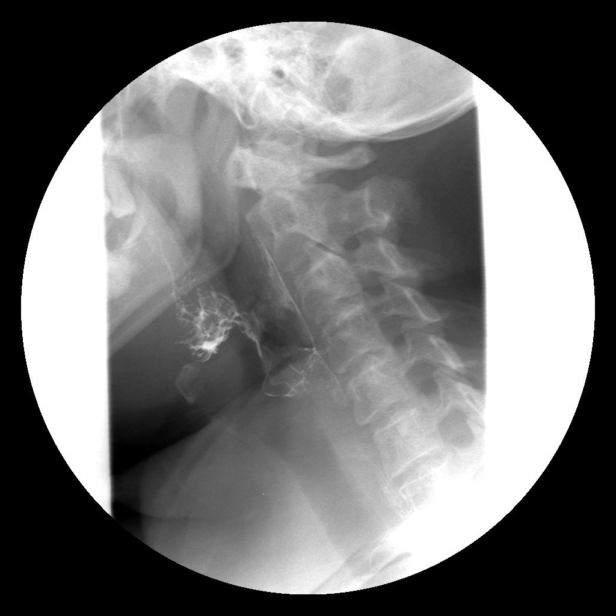

[Series 9: run · 1 of 1 slices shown (7 of 15)]
[im 1/1]
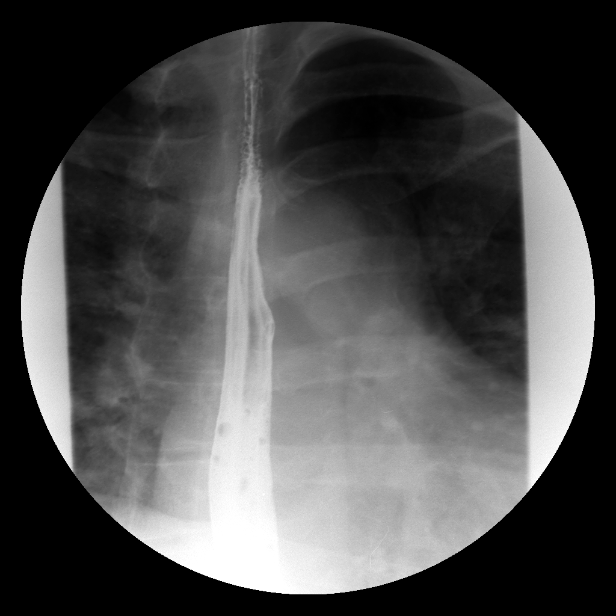

[Series 10: run · 1 of 1 slices shown (8 of 15)]
[im 1/1]
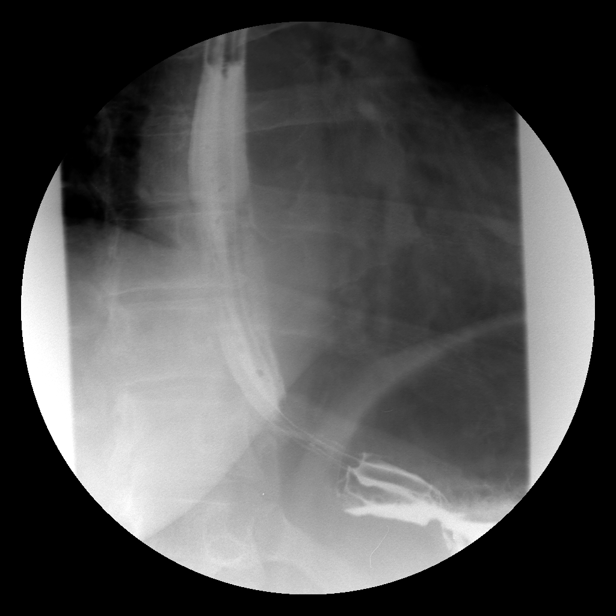

[Series 11: run · 1 of 1 slices shown (9 of 15)]
[im 1/1]
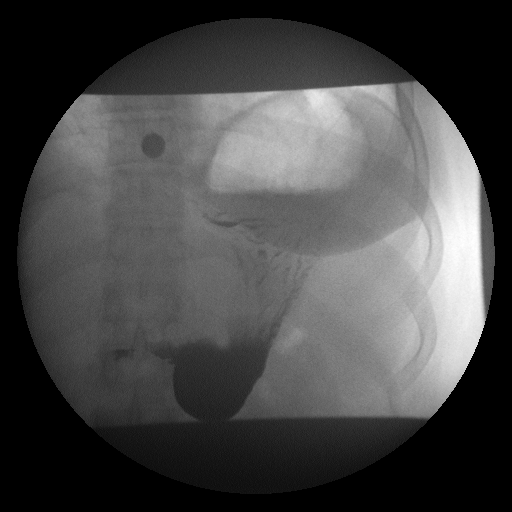

[Series 13: run · 1 of 1 slices shown (10 of 15)]
[im 1/1]
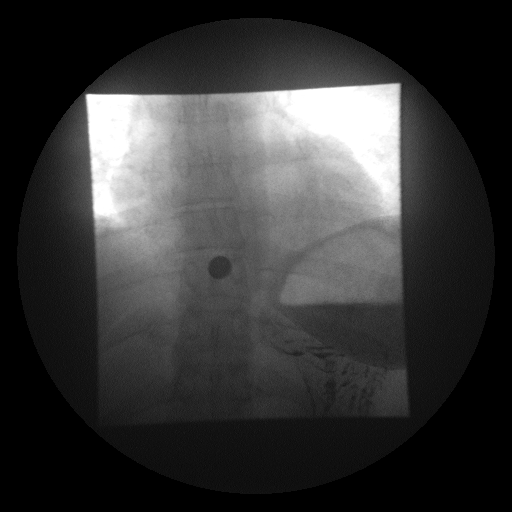

[Series 14: run · 1 of 1 slices shown (11 of 15)]
[im 1/1]
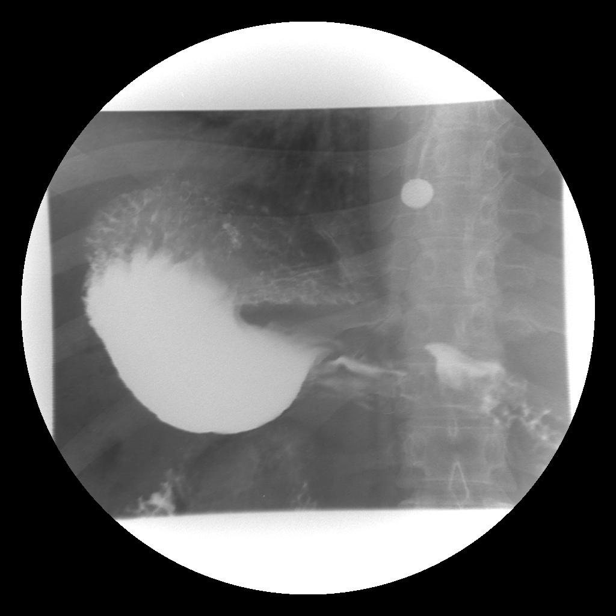

[Series 15: run · 1 of 1 slices shown (12 of 15)]
[im 1/1]
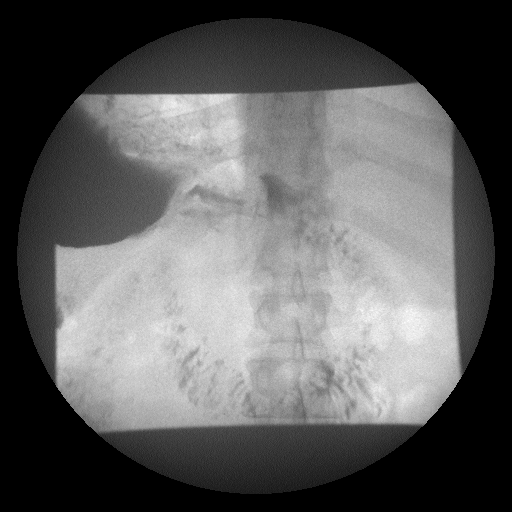

[Series 16: run · 1 of 1 slices shown (13 of 15)]
[im 1/1]
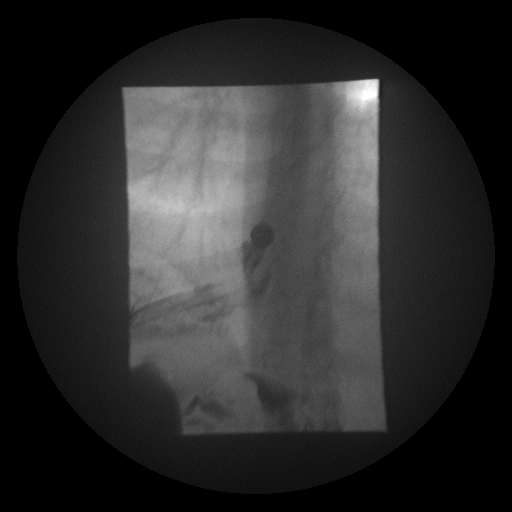

[Series 18: run · 1 of 1 slices shown (14 of 15)]
[im 1/1]
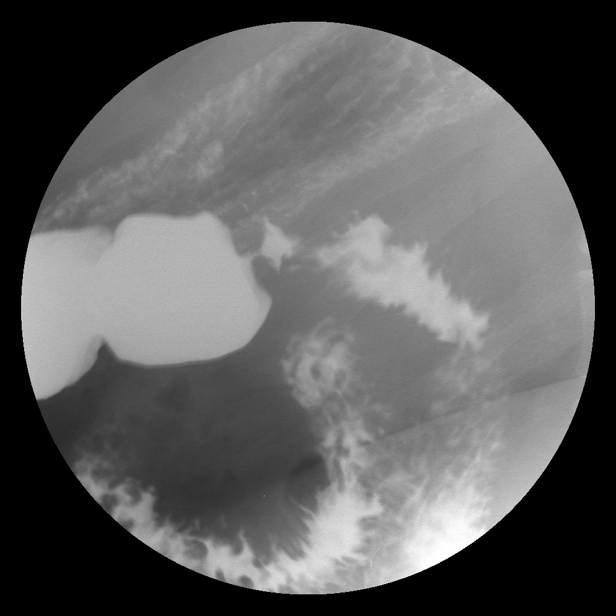

[Series 19: run · 1 of 1 slices shown (15 of 15)]
[im 1/1]
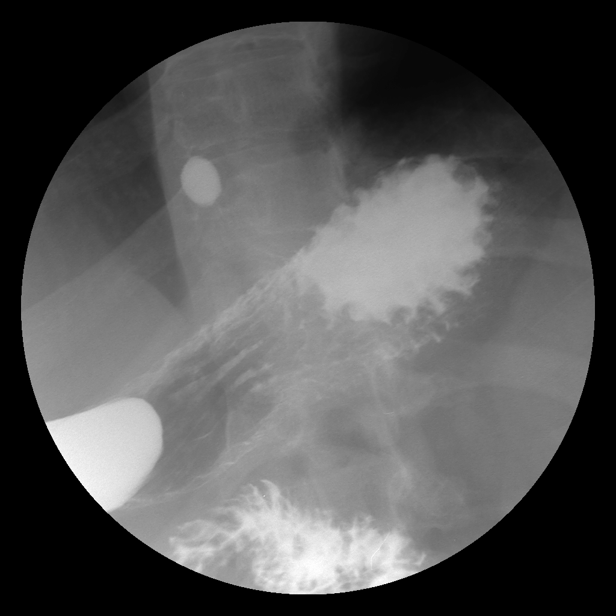

[15 of 19 positions shown; findings below may reference images not displayed]

FINDINGS: Preliminary abdominal radiograph demonstrates normal-
appearing bowel gas pattern.  No abnormal calcific opacity.  No
acute osseous abnormality.

The esophagus is normal in course and contour.  The piriform
sinuses and valleculae are normal.  Minimal change of feline
esophagus is incidentally noted in the upper esophagus.  No focal
ulceration or intrinsic/extrinsic mass lesion is identified.
Spontaneous reflux was observed.  There was no holdup to passage of
the barium tablet until the point of the gastroesophageal junction.
Despite attempts at drinking water and barium, the tablet did pass
until approximately 30 minutes had passed.

The ligament of Treitz is properly located.  The stomach is normal
in appearance.  Duodenal bulb distended normally with contrast on
real time imaging but could not be well documented with
fluoroscopic images.
IMPRESSION: Prolonged holdup of the barium tablet at the level of the
gastroesophageal junction, but no intrinsic or extrinsic mass
lesion can be identified at this location.

No intrinsic or extrinsic abnormality within the esophagus,
stomach, or duodenal bulb.

Spontaneous reflux observed.

## 2010-09-06 ENCOUNTER — Encounter (INDEPENDENT_AMBULATORY_CARE_PROVIDER_SITE_OTHER): Payer: Self-pay | Admitting: General Surgery

## 2010-09-06 ENCOUNTER — Ambulatory Visit (HOSPITAL_COMMUNITY)
Admission: RE | Admit: 2010-09-06 | Discharge: 2010-09-07 | Payer: Self-pay | Source: Home / Self Care | Admitting: General Surgery

## 2010-11-13 ENCOUNTER — Encounter: Payer: Self-pay | Admitting: Family Medicine

## 2011-01-03 LAB — BASIC METABOLIC PANEL
BUN: 7 mg/dL (ref 6–23)
Calcium: 8.8 mg/dL (ref 8.4–10.5)
Chloride: 107 mEq/L (ref 96–112)
Creatinine, Ser: 0.85 mg/dL (ref 0.4–1.2)
GFR calc non Af Amer: >60 mL/min (ref >60)

## 2011-01-03 LAB — SURGICAL PCR SCREEN
MRSA, PCR: NEGATIVE
Staphylococcus aureus: NEGATIVE

## 2011-01-03 LAB — HCG, QUANTITATIVE, PREGNANCY
hCG, Beta Chain, Quant, S: <2 m[IU]/mL (ref <5)
hCG, Beta Chain, Quant, S: <2 m[IU]/mL (ref <5)

## 2011-01-03 LAB — CBC
MCV: 87 fL (ref 78.0–100.0)
Platelets: 213 10*3/uL (ref 150–400)
RDW: 14.4 % (ref 11.5–15.5)
WBC: 13.2 10*3/uL — ABNORMAL HIGH (ref 4.0–10.5)

## 2011-01-03 LAB — AMYLASE: Amylase: 39 U/L (ref 0–105)

## 2011-01-03 LAB — DIFFERENTIAL
Basophils Absolute: 0 10*3/uL (ref 0.0–0.1)
Eosinophils Relative: 0 % (ref 0–5)
Lymphocytes Relative: 19 % (ref 12–46)

## 2011-01-03 LAB — HEPATIC FUNCTION PANEL
ALT: 22 U/L (ref 0–35)
Indirect Bilirubin: 0.4 mg/dL (ref 0.3–0.9)
Total Protein: 6.6 g/dL (ref 6.0–8.3)

## 2011-01-04 LAB — DIFFERENTIAL
Basophils Absolute: 0 10*3/uL (ref 0.0–0.1)
Basophils Relative: 1 % (ref 0–1)
Eosinophils Relative: 3 % (ref 0–5)
Lymphocytes Relative: 26 % (ref 12–46)
Monocytes Absolute: 0.8 10*3/uL (ref 0.1–1.0)
Monocytes Relative: 8 % (ref 3–12)

## 2011-01-04 LAB — COMPREHENSIVE METABOLIC PANEL
AST: 13 U/L (ref 0–37)
Albumin: 4.2 g/dL (ref 3.5–5.2)
Alkaline Phosphatase: 51 U/L (ref 39–117)
Chloride: 105 mEq/L (ref 96–112)
GFR calc Af Amer: >60 mL/min (ref >60)
Potassium: 4.4 mEq/L (ref 3.5–5.1)
Sodium: 139 mEq/L (ref 135–145)
Total Bilirubin: 0.6 mg/dL (ref 0.3–1.2)

## 2011-01-04 LAB — URINALYSIS, ROUTINE W REFLEX MICROSCOPIC
Hgb urine dipstick: NEGATIVE
Nitrite: NEGATIVE
Specific Gravity, Urine: 1.02 (ref 1.005–1.030)
Urobilinogen, UA: 0.2 mg/dL (ref 0.0–1.0)

## 2011-01-04 LAB — CBC
Platelets: 204 10*3/uL (ref 150–400)
RBC: 4.81 MIL/uL (ref 3.87–5.11)
WBC: 10.1 10*3/uL (ref 4.0–10.5)

## 2011-01-04 LAB — PREGNANCY, URINE: Preg Test, Ur: NEGATIVE

## 2011-01-10 LAB — URINALYSIS, ROUTINE W REFLEX MICROSCOPIC
Glucose, UA: NEGATIVE mg/dL
Ketones, ur: NEGATIVE mg/dL
Leukocytes, UA: NEGATIVE
pH: 6 (ref 5.0–8.0)

## 2011-01-10 LAB — CBC
HCT: 40.7 % (ref 36.0–46.0)
Hemoglobin: 14.3 g/dL (ref 12.0–15.0)
MCHC: 35.1 g/dL (ref 30.0–36.0)
MCV: 87 fL (ref 78.0–100.0)
Platelets: 201 10*3/uL (ref 150–400)
RDW: 14 % (ref 11.5–15.5)

## 2011-01-10 LAB — HEPATIC FUNCTION PANEL
ALT: 13 U/L (ref 0–35)
AST: 14 U/L (ref 0–37)
Alkaline Phosphatase: 60 U/L (ref 39–117)
Bilirubin, Direct: 0.1 mg/dL (ref 0.0–0.3)

## 2011-01-10 LAB — BASIC METABOLIC PANEL
BUN: 8 mg/dL (ref 6–23)
CO2: 26 mEq/L (ref 19–32)
Chloride: 106 mEq/L (ref 96–112)
GFR calc non Af Amer: >60 mL/min (ref >60)
Glucose, Bld: 103 mg/dL — ABNORMAL HIGH (ref 70–99)
Potassium: 3.8 mEq/L (ref 3.5–5.1)
Sodium: 138 mEq/L (ref 135–145)

## 2011-01-10 LAB — DIFFERENTIAL
Basophils Absolute: 0 10*3/uL (ref 0.0–0.1)
Eosinophils Absolute: 0.3 10*3/uL (ref 0.0–0.7)
Eosinophils Relative: 3 % (ref 0–5)
Lymphocytes Relative: 28 % (ref 12–46)
Monocytes Absolute: 0.7 10*3/uL (ref 0.1–1.0)

## 2011-01-10 LAB — POCT CARDIAC MARKERS: Troponin i, poc: <0.05 ng/mL (ref 0.00–0.09)

## 2011-01-10 LAB — URINE MICROSCOPIC-ADD ON

## 2011-04-12 ENCOUNTER — Other Ambulatory Visit: Payer: Self-pay | Admitting: Obstetrics & Gynecology

## 2011-04-12 DIAGNOSIS — N912 Amenorrhea, unspecified: Secondary | ICD-10-CM

## 2011-04-12 DIAGNOSIS — R102 Pelvic and perineal pain: Secondary | ICD-10-CM

## 2011-04-14 ENCOUNTER — Other Ambulatory Visit (HOSPITAL_COMMUNITY): Payer: Self-pay

## 2011-04-17 ENCOUNTER — Other Ambulatory Visit: Payer: Self-pay | Admitting: Obstetrics & Gynecology

## 2011-04-17 ENCOUNTER — Other Ambulatory Visit (HOSPITAL_COMMUNITY): Payer: Self-pay

## 2011-04-17 ENCOUNTER — Inpatient Hospital Stay (HOSPITAL_COMMUNITY): Admission: RE | Admit: 2011-04-17 | Payer: Self-pay | Source: Ambulatory Visit

## 2011-04-17 DIAGNOSIS — R102 Pelvic and perineal pain: Secondary | ICD-10-CM

## 2011-04-17 DIAGNOSIS — N912 Amenorrhea, unspecified: Secondary | ICD-10-CM

## 2011-04-18 ENCOUNTER — Other Ambulatory Visit: Payer: Self-pay | Admitting: Obstetrics & Gynecology

## 2011-04-18 DIAGNOSIS — R102 Pelvic and perineal pain: Secondary | ICD-10-CM

## 2011-04-18 DIAGNOSIS — N912 Amenorrhea, unspecified: Secondary | ICD-10-CM

## 2011-04-20 ENCOUNTER — Ambulatory Visit (HOSPITAL_COMMUNITY)
Admission: RE | Admit: 2011-04-20 | Discharge: 2011-04-20 | Disposition: A | Discharge status: Home / Self Care | Payer: 59 | Source: Ambulatory Visit | Attending: Obstetrics & Gynecology | Admitting: Obstetrics & Gynecology

## 2011-04-20 ENCOUNTER — Other Ambulatory Visit (HOSPITAL_COMMUNITY): Payer: Self-pay

## 2011-04-20 ENCOUNTER — Other Ambulatory Visit: Payer: Self-pay | Admitting: Obstetrics & Gynecology

## 2011-04-20 DIAGNOSIS — N912 Amenorrhea, unspecified: Secondary | ICD-10-CM | POA: Insufficient documentation

## 2011-04-20 DIAGNOSIS — R102 Pelvic and perineal pain: Secondary | ICD-10-CM

## 2011-04-20 DIAGNOSIS — N83209 Unspecified ovarian cyst, unspecified side: Secondary | ICD-10-CM | POA: Insufficient documentation

## 2011-04-20 DIAGNOSIS — N949 Unspecified condition associated with female genital organs and menstrual cycle: Secondary | ICD-10-CM | POA: Insufficient documentation

## 2011-04-20 IMAGING — US US TRANSVAGINAL NON-OB
1 series · 13 of 25 positions shown · non-contrast
Comparison: None

CLINICAL DATA: Pelvic pain, amenorrhea

TRANSABDOMINAL AND TRANSVAGINAL ULTRASOUND OF PELVIS
TECHNIQUE: Both transabdominal and transvaginal ultrasound
examinations of the pelvis were performed. Transabdominal technique
was performed for global imaging of the pelvis including uterus,
ovaries, adnexal regions, and pelvic cul-de-sac.

[Series 1: us transvaginal non-ob · 0.30mm/px · 13 of 65 slices shown]
[im 1/65]
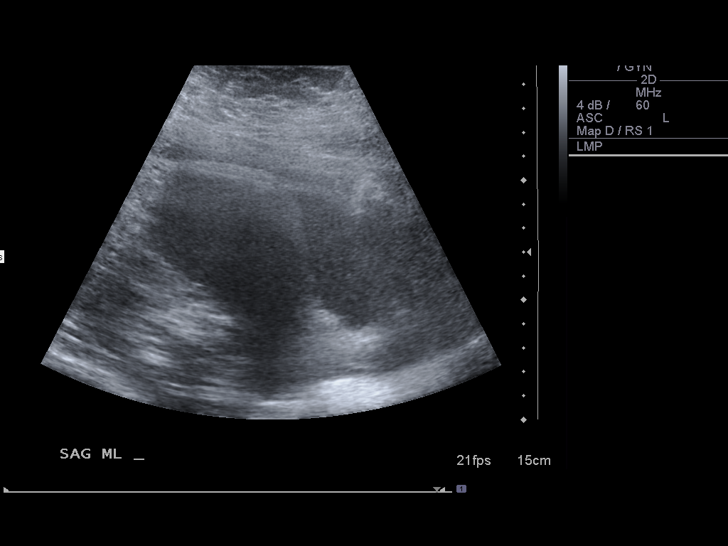
[im 6/65]
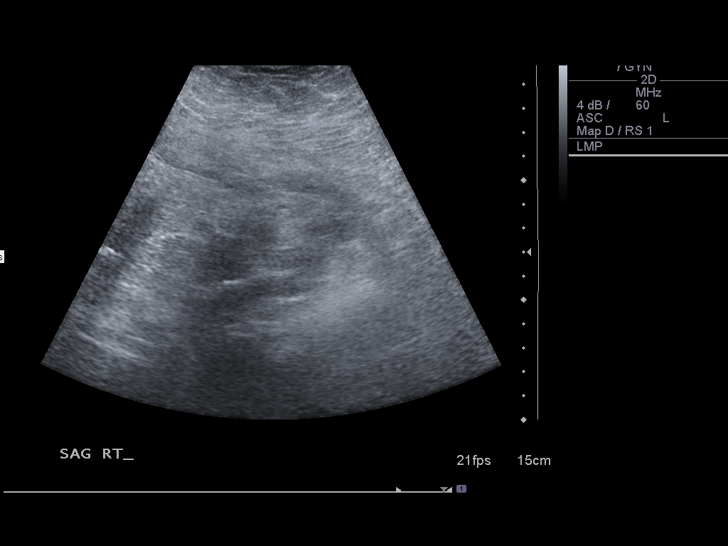
[im 11/65]
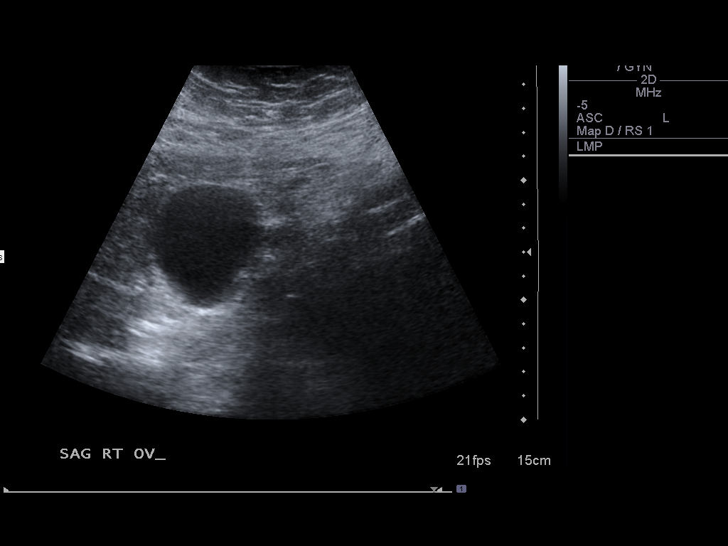
[im 17/65]
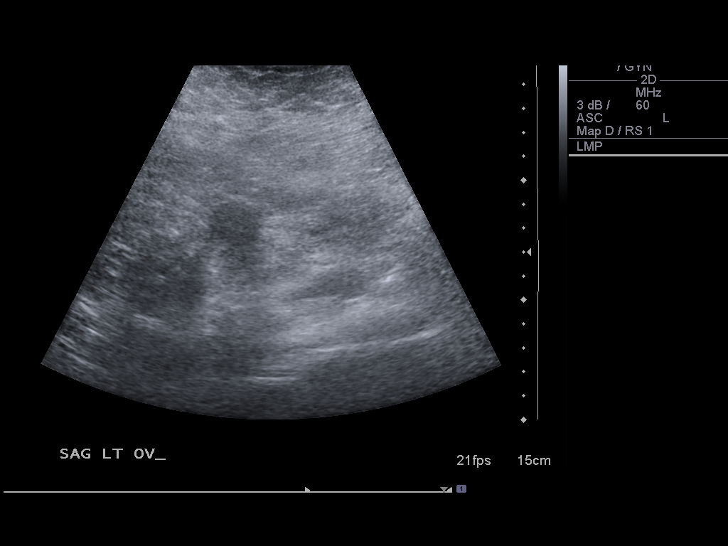
[im 22/65]
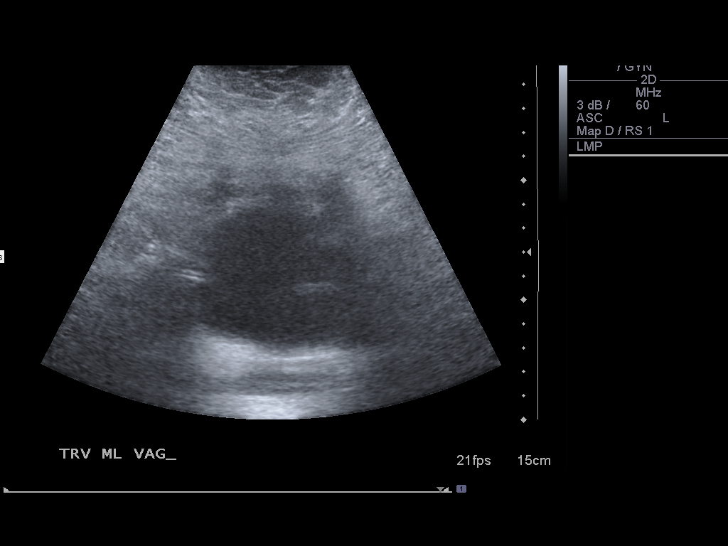
[im 27/65]
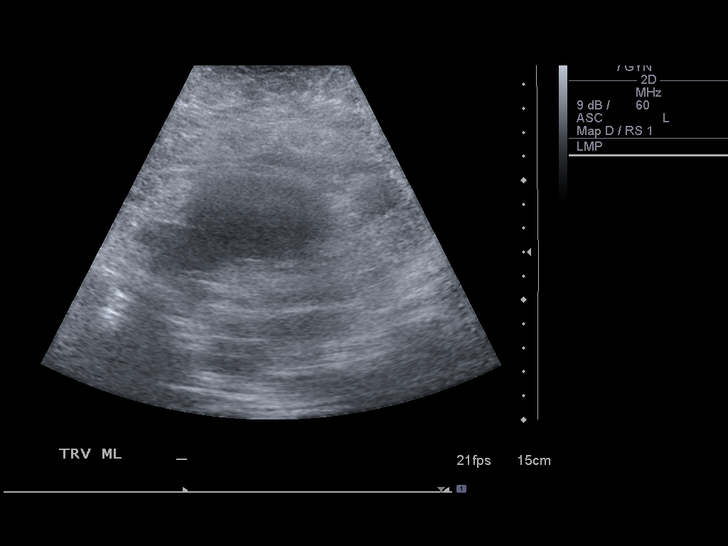
[im 33/65]
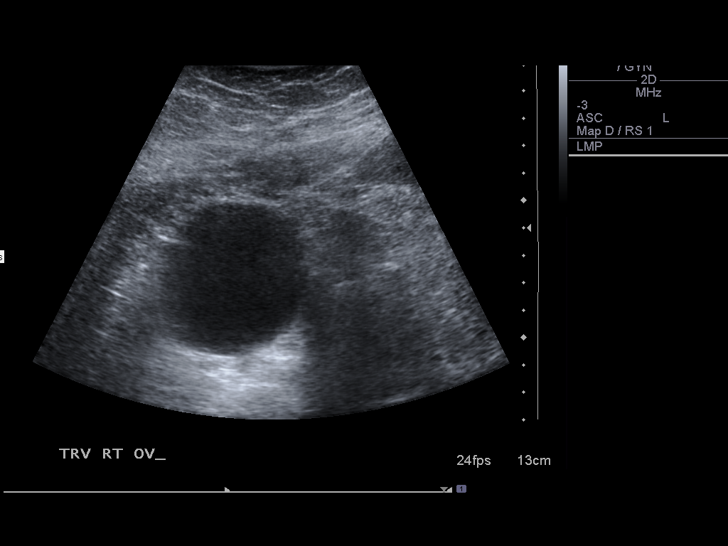
[im 38/65]
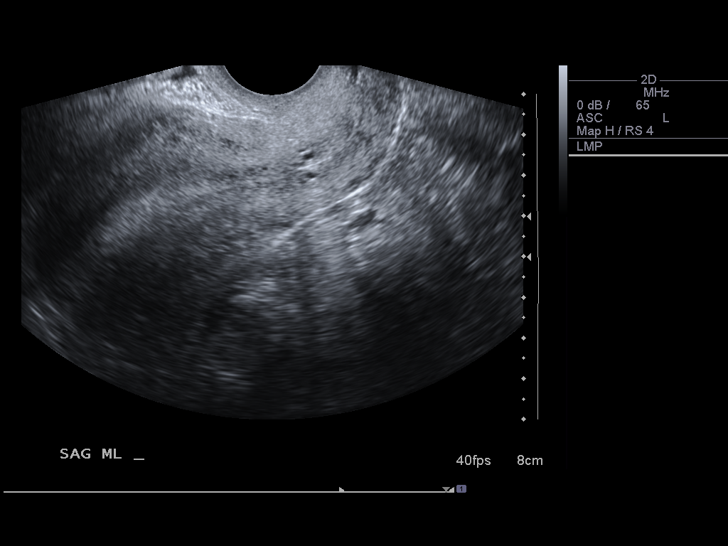
[im 43/65]
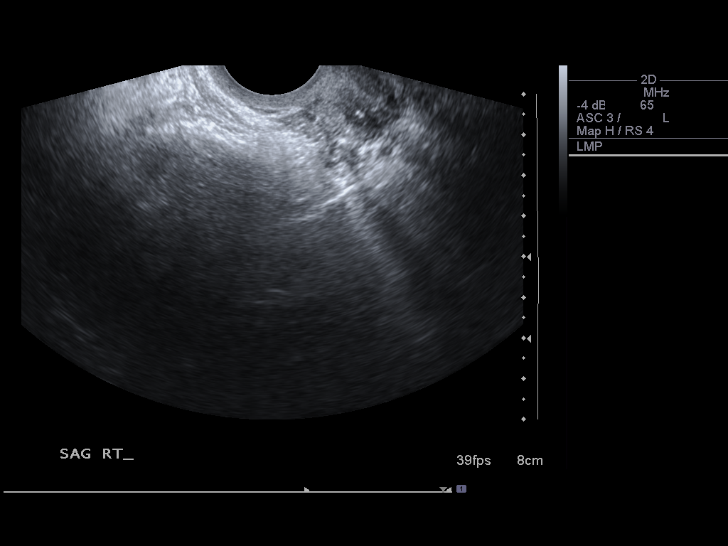
[im 49/65]
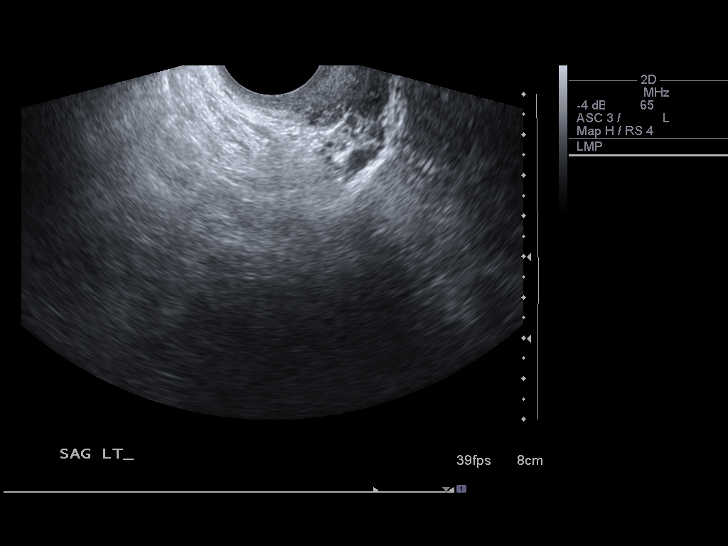
[im 54/65]
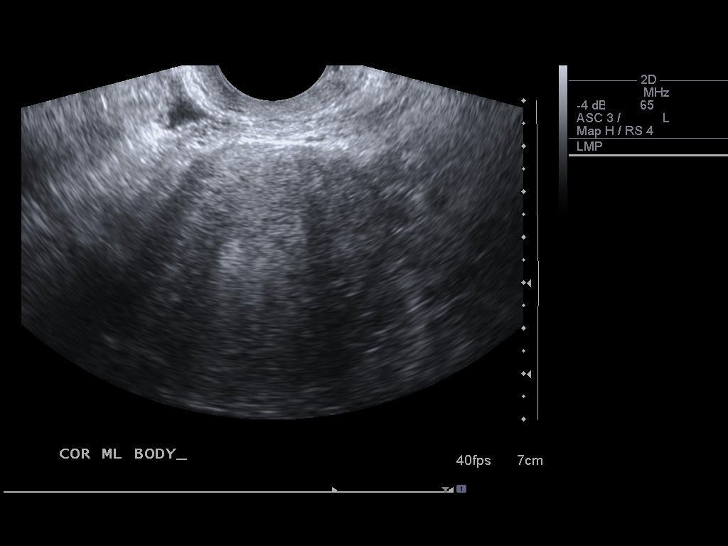
[im 59/65]
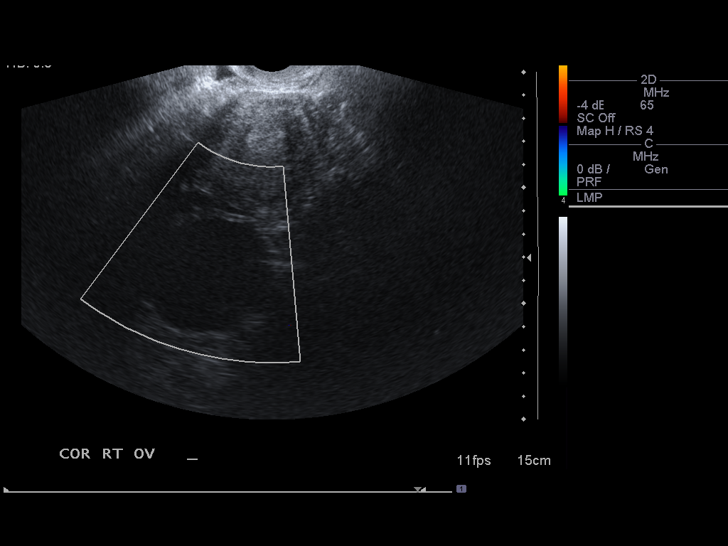
[im 65/65]
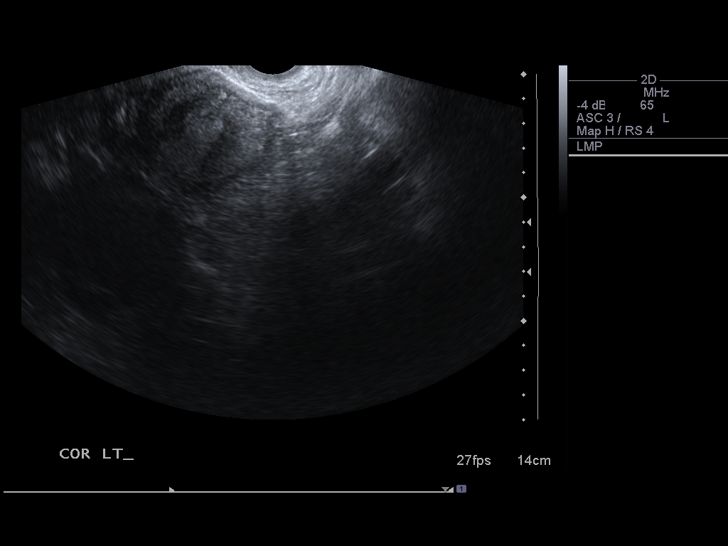

[13 of 25 positions shown; findings below may reference images not displayed]

It was necessary to proceed with endovaginal exam following the
transabdomnial exam to visualize the endometrium, adnexae, and
ovaries.
FINDINGS: Uterus: 10.0 cm length by 4.8 cm AP by 5.9 cm transverse.  Normal
morphology without mass.

Endometrium: 12 mm thick, minimally prominent.  No endometrial
fluid.

Right ovary:  6.4 x 5.7 x 5.6 cm. Complex right ovarian cyst,
approximately 5.3 x 5.8 x 6.1 cm, containing a potential mural
nodule and scattered internal echoes versus artifacts.

Left ovary: 2.1 x 2.1 x 2.2 cm.  Seen only on trans abdominal
imaging.  Normal morphology without mass.

Other findings: No free fluid or additional adnexal masses.
IMPRESSION: Minimal prominence of endometrial complex, potentially related to
phase of menses.

Enlargement of right ovary by a complex appearing cyst 6.1 cm in
greatest size, questionably containing a mural nodule as well as
scattered internal echogenicity.
This lesion requires further characterization by MR imaging with
and without contrast to exclude cystic ovarian neoplasm.

## 2012-03-15 ENCOUNTER — Encounter (HOSPITAL_COMMUNITY): Payer: Self-pay | Admitting: *Deleted

## 2012-03-15 ENCOUNTER — Emergency Department (HOSPITAL_COMMUNITY)
Admission: EM | Admit: 2012-03-15 | Discharge: 2012-03-15 | Disposition: A | Discharge status: Home / Self Care | Payer: BC Managed Care – PPO | Attending: Emergency Medicine | Admitting: Emergency Medicine

## 2012-03-15 DIAGNOSIS — S30860A Insect bite (nonvenomous) of lower back and pelvis, initial encounter: Secondary | ICD-10-CM | POA: Insufficient documentation

## 2012-03-15 DIAGNOSIS — W57XXXA Bitten or stung by nonvenomous insect and other nonvenomous arthropods, initial encounter: Secondary | ICD-10-CM | POA: Insufficient documentation

## 2012-03-15 HISTORY — DX: Depression, unspecified: F32.A

## 2012-03-15 HISTORY — DX: Anxiety disorder, unspecified: F41.9

## 2012-03-15 HISTORY — DX: Major depressive disorder, single episode, unspecified: F32.9

## 2012-03-15 MED ORDER — DOXYCYCLINE HYCLATE 100 MG PO CAPS: 100.0000 mg | ORAL_CAPSULE | Freq: Two times a day (BID) | ORAL | Status: AC

## 2012-03-15 MED ORDER — DOXYCYCLINE HYCLATE 100 MG PO TABS
100.0000 mg | ORAL_TABLET | Freq: Once | ORAL | Status: AC
  Administered 2012-03-15: 100 mg via ORAL
  Filled 2012-03-15: qty 1

## 2012-03-15 NOTE — ED Notes (Signed)
Pt presents with 2 tick bites on upper back, area is raised red with smaller raised papules noted around tick bite. Center of "bite area" is dark brown/black. Pt denies fever, sore throat, joint aches and pains. Pt reports a sore throat at this time. Pt is alert and oriented x 4. Skin pink warm and dry.

## 2012-03-15 NOTE — Discharge Instructions (Signed)
Wood Tick Bite Ticks are insects that attach themselves to the skin. Most tick bites are harmless, but sometimes ticks carry diseases that can make a person quite ill. The chance of getting ill depends on:  The kind of tick that bites you.   Time of year.   How long the tick is attached.   Geographic location.  Wood ticks are also called dog ticks. They are generally black. They can have white markings. They live in shrubs and grassy areas. They are larger than deer ticks. Wood ticks are about the size of a watermelon seed. They have a hard body. The most common places for ticks to attach themselves are the scalp, neck, armpits, waist, and groin. Wood ticks may stay attached for up to 2 weeks. TICKS MUST BE REMOVED AS SOON AS POSSIBLE TO HELP PREVENT DISEASES CAUSED BY TICK BITES.  TO REMOVE A TICK: 1. If available, put on latex gloves before trying to remove a tick.  2. Grasp the tick as close to the skin as possible, with curved forceps, fine tweezers or a special tick removal tool.  3. Pull gently with steady pressure until the tick lets go. Do not twist the tick or jerk it suddenly. This may break off the tick's head or mouth parts.  4. Do not crush the tick's body. This could force disease-carrying fluids from the tick into your body.  5. After the tick is removed, wash the bite area and your hands with soap and water or other disinfectant.  6. Apply a small amount of antiseptic cream or ointment to the bite site.  7. Wash and disinfect any instruments that were used.  8. Save the tick in a jar or plastic bag for later identification. Preserve the tick with a bit of alcohol or put it in the freezer.  9. Do not apply a hot match, petroleum jelly, or fingernail polish to the tick. This does not work and may increase the chances of disease from the tick bite.  YOU MAY NEED TO SEE YOUR CAREGIVER FOR A TETANUS SHOT NOW IF:  You have no idea when you had the last one.   You have never had a  tetanus shot before.  If you need a tetanus shot, and you decide not to get one, there is a rare chance of getting tetanus. Sickness from tetanus can be serious. If you get a tetanus shot, your arm may swell, get red and warm to the touch at the shot site. This is common and not a problem. PREVENTION  Wear protective clothing. Long sleeves and pants are best.   Wear white clothes to see ticks more easily   Tuck your pant legs into your socks.   If walking on trail, stay in the middle of the trail to avoid brushing against bushes.   Put insect repellent on all exposed skin and along boot tops, pant legs and sleeve cuffs   Check clothing, hair and skin repeatedly and before coming inside.   Brush off any ticks that are not attached.  SEEK MEDICAL CARE IF:   You cannot remove a tick or part of the tick that is left in the skin.   Unexplained fever.   Redness and swelling in the area of the tick bite.   Tender, swollen lymph glands.   Diarrhea.   Weight loss.   Cough.   Fatigue.   Muscle, joint or bone pain.   Belly pain.   Headache.   Rash.    SEEK IMMEDIATE MEDICAL CARE IF:   You develop an oral temperature above 102 F (38.9 C).   You are having trouble walking or moving your legs.   Numbness in the legs.   Shortness of breath.   Confusion.   Repeated vomiting.  Document Released: 10/06/2000 Document Revised: 09/28/2011 Document Reviewed: 09/14/2008 [employer] Patient Information 2012 [employer]. 

## 2012-03-15 NOTE — ED Notes (Signed)
Tick bite x 2 to upper back x 2 days.

## 2012-03-15 NOTE — ED Provider Notes (Signed)
History     CSN: [id number]  Arrival date & time 03/15/12  4098   First MD Initiated Contact with Patient 03/15/12 1821      Chief Complaint  Patient presents with  . Tick Removal    (Consider location/radiation/quality/duration/timing/severity/associated sxs/prior treatment) HPI Comments: Alexandra Shaffer presents for concern with 2 ticks that were removed from her upper back 2 days ago with a tweezers.  The areas remain tender and itchy,  With increasing knots and redness at the sites.  She is unaware of the length of time they were attached,  But were barely engorged.  Her daughter squeezed one of the sites and obtained clear fluid.  Patient denies fevers,  Chills,  Myalgia, arthralgias,  No rash or nausea,  Emesis.  The history is provided by the patient.    Past Medical History  Diagnosis Date  . Anxiety   . Depression     Past Surgical History  Procedure Date  . Cholecystectomy     No family history on file.  History  Substance Use Topics  . Smoking status: Never Smoker   . Smokeless tobacco: Not on file  . Alcohol Use: No    OB History    Grav Para Term Preterm Abortions TAB SAB Ect Mult Living                  Review of Systems  Constitutional: Negative for fever and chills.  Respiratory: Negative for shortness of breath and wheezing.   Musculoskeletal: Negative for myalgias, joint swelling and arthralgias.  Skin: Positive for wound. Negative for rash.  Neurological: Negative for numbness.    Allergies  Codeine and Keflex  Home Medications   Current Outpatient Rx  Name Route Sig Dispense Refill  . ALPRAZOLAM 1 MG PO TABS Oral Take 1 mg by mouth at bedtime as needed.    . MEGESTROL ACETATE 40 MG PO TABS Oral Take 40 mg by mouth daily.    . SERTRALINE HCL 100 MG PO TABS Oral Take 100 mg by mouth daily.    Marland Kitchen DOXYCYCLINE HYCLATE 100 MG PO CAPS Oral Take 1 capsule (100 mg total) by mouth 2 (two) times daily. 24 capsule 0    BP 144/84  Pulse 95   Temp(Src) 98.1 F (36.7 C) (Oral)  Resp 20  Ht 5\' 10"  (1.778 m)  Wt 337 lb (152.862 kg)  BMI 48.35 kg/m2  SpO2 98%  Physical Exam  Constitutional: She appears well-developed and well-nourished. No distress.  HENT:  Head: Normocephalic.  Neck: Neck supple.  Cardiovascular: Normal rate.   Pulmonary/Chest: Effort normal. She has no wheezes.  Musculoskeletal: Normal range of motion. She exhibits no edema.  Skin: There is erythema.       Two raised,  Indurated papules upper back approx 1 cm in diameter with 0.5 cm surrounding erythema.  No fluctuance or drainage.    ED Course  Procedures (including critical care time)  Labs Reviewed - No data to display No results found.   1. Tick bite of back       MDM  Pt placed on doxycycline 14 day tx given tick exposure but also for possible early localized insect bite infection prophylaxis.  Advised warm compresses to the sites,  Avoid squeezing lesions.  Recheck by pcp if not improved or for significant worsened sx.        Burgess Amor, [place] 03/16/12 1321

## 2012-03-16 NOTE — ED Provider Notes (Signed)
Medical screening examination/treatment/procedure(s) were performed by non-physician practitioner and as supervising physician I was immediately available for consultation/collaboration.   Gretta Samons, MD 03/16/12 1455 

## 2013-05-28 ENCOUNTER — Other Ambulatory Visit (HOSPITAL_BASED_OUTPATIENT_CLINIC_OR_DEPARTMENT_OTHER): Payer: Self-pay | Admitting: Family Medicine

## 2013-05-28 DIAGNOSIS — Z139 Encounter for screening, unspecified: Secondary | ICD-10-CM

## 2013-06-05 ENCOUNTER — Ambulatory Visit (HOSPITAL_COMMUNITY): Payer: 59

## 2013-06-13 ENCOUNTER — Ambulatory Visit (HOSPITAL_COMMUNITY): Payer: 59

## 2013-09-15 ENCOUNTER — Emergency Department (HOSPITAL_COMMUNITY): Payer: BC Managed Care – PPO

## 2013-09-15 ENCOUNTER — Encounter (HOSPITAL_COMMUNITY): Payer: Self-pay | Admitting: Emergency Medicine

## 2013-09-15 ENCOUNTER — Emergency Department (HOSPITAL_COMMUNITY)
Admission: EM | Admit: 2013-09-15 | Discharge: 2013-09-15 | Disposition: A | Discharge status: Home / Self Care | Payer: BC Managed Care – PPO | Attending: Emergency Medicine | Admitting: Emergency Medicine

## 2013-09-15 DIAGNOSIS — F411 Generalized anxiety disorder: Secondary | ICD-10-CM | POA: Insufficient documentation

## 2013-09-15 DIAGNOSIS — F3289 Other specified depressive episodes: Secondary | ICD-10-CM | POA: Insufficient documentation

## 2013-09-15 DIAGNOSIS — F329 Major depressive disorder, single episode, unspecified: Secondary | ICD-10-CM | POA: Insufficient documentation

## 2013-09-15 DIAGNOSIS — M7989 Other specified soft tissue disorders: Secondary | ICD-10-CM | POA: Insufficient documentation

## 2013-09-15 DIAGNOSIS — Z79899 Other long term (current) drug therapy: Secondary | ICD-10-CM | POA: Insufficient documentation

## 2013-09-15 DIAGNOSIS — R079 Chest pain, unspecified: Secondary | ICD-10-CM | POA: Insufficient documentation

## 2013-09-15 LAB — CBC WITH DIFFERENTIAL/PLATELET
Eosinophils Absolute: 0.2 10*3/uL (ref 0.0–0.7)
Eosinophils Relative: 4 % (ref 0–5)
HCT: 39.5 % (ref 36.0–46.0)
Hemoglobin: 13.1 g/dL (ref 12.0–15.0)
Lymphocytes Relative: 36 % (ref 12–46)
Lymphs Abs: 2.2 10*3/uL (ref 0.7–4.0)
MCH: 29 pg (ref 26.0–34.0)
MCV: 87.6 fL (ref 78.0–100.0)
Monocytes Absolute: 0.5 10*3/uL (ref 0.1–1.0)
Monocytes Relative: 9 % (ref 3–12)
RBC: 4.51 MIL/uL (ref 3.87–5.11)

## 2013-09-15 LAB — BASIC METABOLIC PANEL
BUN: 8 mg/dL (ref 6–23)
CO2: 27 mEq/L (ref 19–32)
Calcium: 8.9 mg/dL (ref 8.4–10.5)
GFR calc non Af Amer: 82 mL/min — ABNORMAL LOW (ref >90)
Glucose, Bld: 97 mg/dL (ref 70–99)

## 2013-09-15 IMAGING — CR DG CHEST 2V
2 series · 2 of 2 positions shown · non-contrast
Comparison: [DATE]

CLINICAL DATA: Shortness of breath and chest pain.

EXAM:
CHEST - 2 VIEW

[view not recorded (1 of 2)]
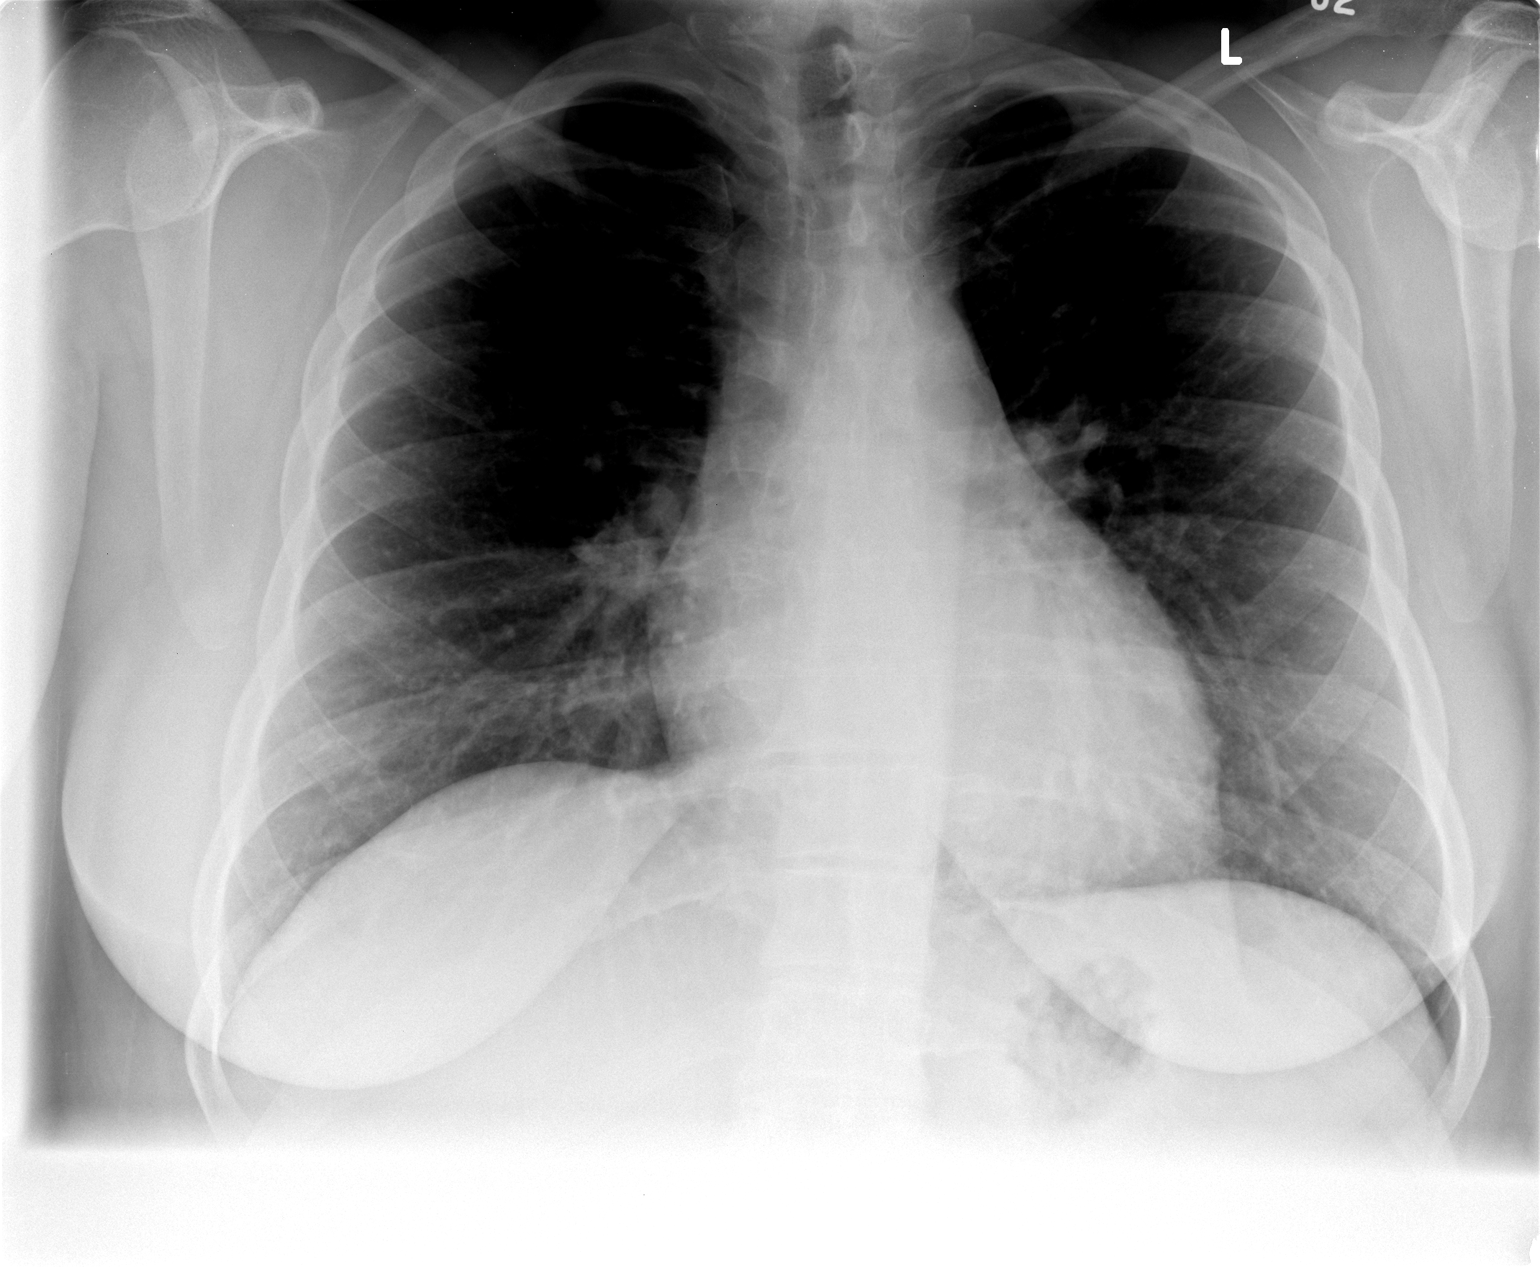

[view not recorded (2 of 2)]
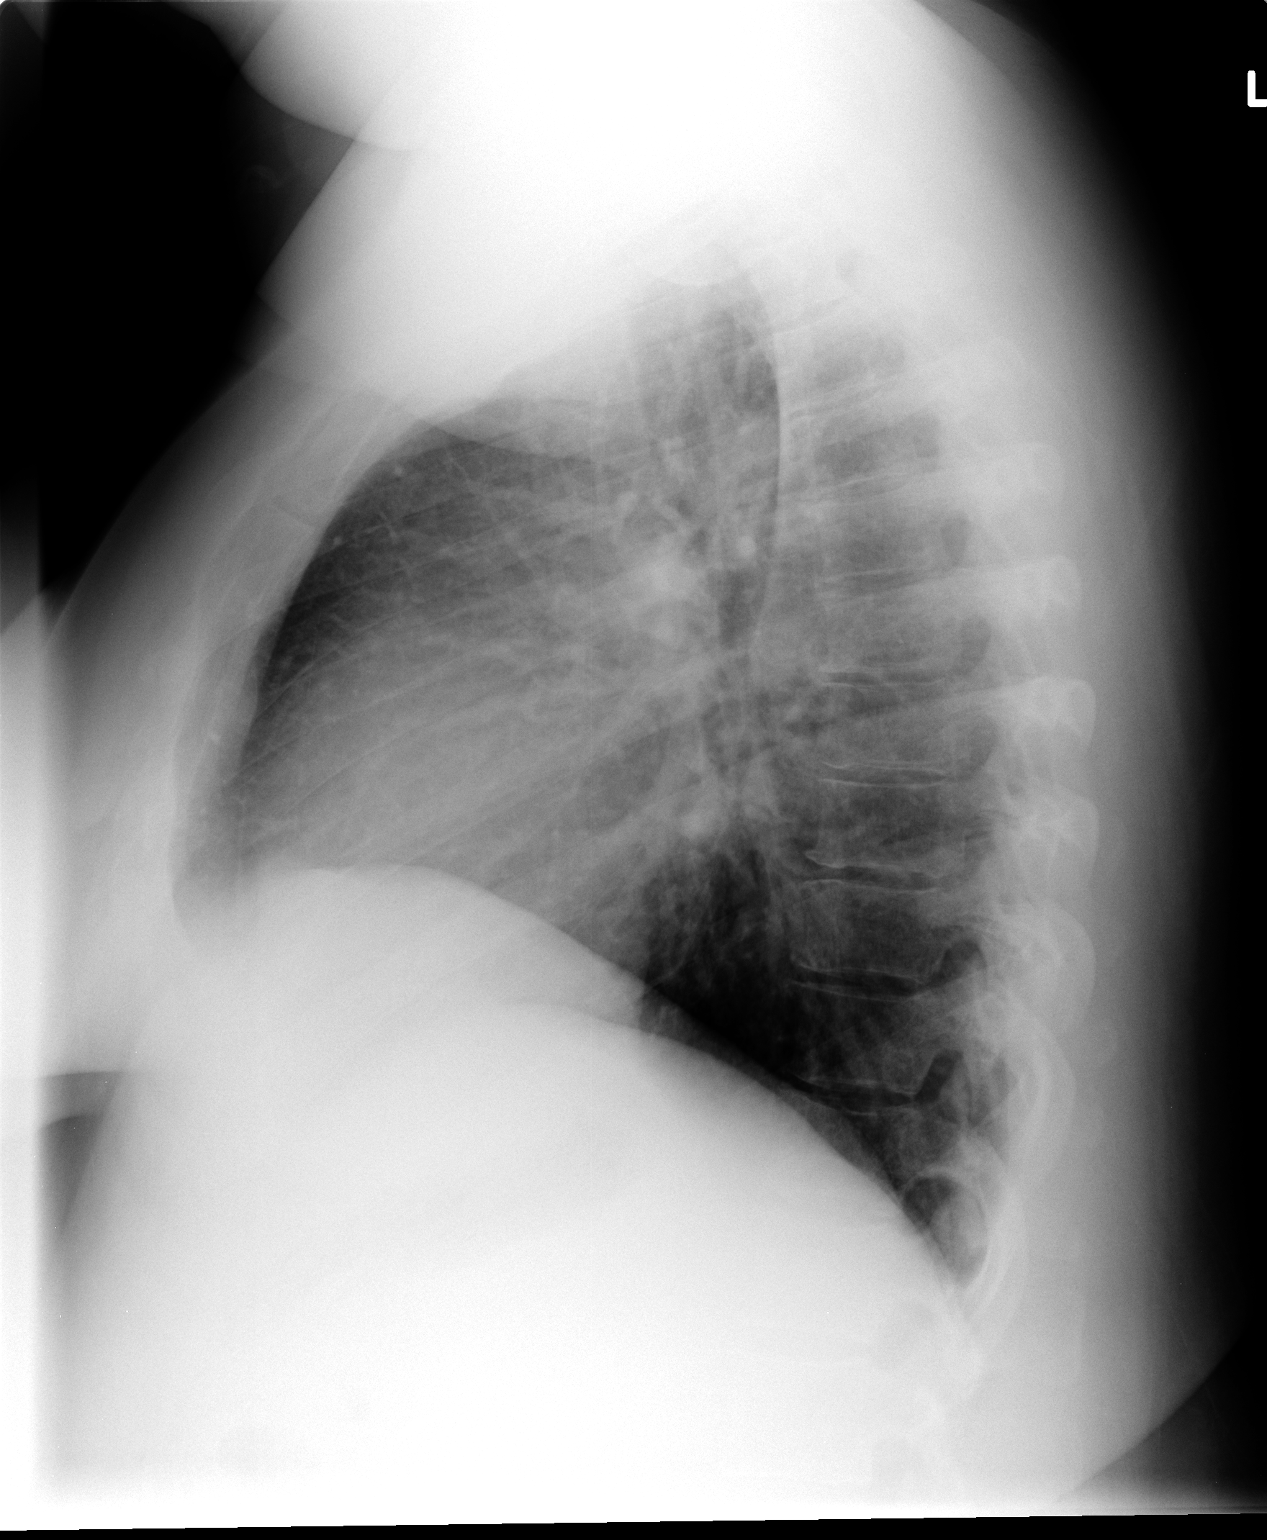

[2 of 2 positions shown; findings below may reference images not displayed]

FINDINGS: The heart size and mediastinal contours are within normal limits.
There is no evidence of pulmonary edema, consolidation,
pneumothorax, nodule or pleural fluid. The visualized skeletal
structures are unremarkable.
IMPRESSION: No active disease.

## 2013-09-15 MED ORDER — HYDROCHLOROTHIAZIDE 25 MG PO TABS: 25.0000 mg | ORAL_TABLET | Freq: Every day | ORAL | Status: DC

## 2013-09-15 MED ORDER — KETOROLAC TROMETHAMINE 30 MG/ML IJ SOLN
30.0000 mg | Freq: Once | INTRAMUSCULAR | Status: AC
  Administered 2013-09-15: 30 mg via INTRAVENOUS
  Filled 2013-09-15: qty 1

## 2013-09-15 NOTE — ED Provider Notes (Signed)
Scribed for Geoffery Lyons, MD, the patient was seen in room APA07/APA07. This chart was scribed by Lewanda Rife, ED scribe. Patient's care was started at 3:19 PM  CSN: [id number]     Arrival date & time 09/15/13  1418 History   First MD Initiated Contact with Patient 09/15/13 1507     Chief Complaint  Patient presents with  . Chest Pain   (Consider location/radiation/quality/duration/timing/severity/associated sxs/prior Treatment) The history is provided by the patient. No language interpreter was used.   HPI Comments: Alexandra Shaffer is a 42 y.o. female who presents to the Emergency Department complaining of intermittent moderate left sided chest pain radiating to left jaw onset last night. States she was eating a sandwich after work when chest pain started. Reports she had not worked for the first time in years. Reports associated bilateral lower leg swelling. Reports chest pain is exacerbated by touch and alleviated by nothing. Denies associated numbness, shortness of breath, DOE, fever, bloody stools, and abdominal pain. Denies PMHx of CAD. Denies smoking cigarettes.     Past Medical History  Diagnosis Date  . Anxiety   . Depression    Past Surgical History  Procedure Laterality Date  . Cholecystectomy     History reviewed. No pertinent family history. History  Substance Use Topics  . Smoking status: Never Smoker   . Smokeless tobacco: Not on file  . Alcohol Use: No   OB History   Grav Para Term Preterm Abortions TAB SAB Ect Mult Living                 Review of Systems  Constitutional: Negative for fever.  Cardiovascular: Positive for chest pain.  All other systems reviewed and are negative.  A complete 10 system review of systems was obtained and all systems are negative except as noted in the HPI and PMHx.     Allergies  Codeine and Keflex  Home Medications   Current Outpatient Rx  Name  Route  Sig  Dispense  Refill  . ALPRAZolam (XANAX) 1 MG tablet  Oral   Take 1 mg by mouth at bedtime as needed.         . megestrol (MEGACE) 40 MG tablet   Oral   Take 40 mg by mouth daily.         . sertraline (ZOLOFT) 100 MG tablet   Oral   Take 100 mg by mouth daily.          BP 130/76  Pulse 87  Temp(Src) 98.1 F (36.7 C) (Oral)  Resp 20  Ht 5\' 10"  (1.778 m)  Wt 350 lb (158.759 kg)  BMI 50.22 kg/m2  SpO2 100%  LMP 09/08/2013 Physical Exam  Nursing note and vitals reviewed. Constitutional: She is oriented to person, place, and time. She appears well-developed and well-nourished. No distress.  HENT:  Head: Normocephalic and atraumatic.  Eyes: EOM are normal.  Neck: Neck supple. No tracheal deviation present.  Cardiovascular: Normal rate and regular rhythm.   No murmur heard. Pulmonary/Chest: Effort normal and breath sounds normal. No respiratory distress.  TTP to left anterior chest just below the breast  Abdominal: Soft. There is no tenderness.  Musculoskeletal: Normal range of motion. She exhibits no edema.  Neurological: She is alert and oriented to person, place, and time.  Skin: Skin is warm and dry.  Psychiatric: She has a normal mood and affect. Her behavior is normal.    ED Course  Procedures (including critical care time)  COORDINATION  OF CARE:  Nursing notes reviewed. Vital signs reviewed. Initial pt interview and examination performed.   3:23 PM-Discussed work up plan with pt at bedside, which includes EKG, Troponin, CBC with diff panel, BMP, and CXR. Pt agrees with plan.   Treatment plan initiated: Medications  ketorolac (TORADOL) 30 MG/ML injection 30 mg (30 mg Intravenous Given 09/15/13 1534)     Initial diagnostic testing ordered.    Labs Review Labs Reviewed  CBC WITH DIFFERENTIAL  BASIC METABOLIC PANEL  TROPONIN I   Imaging Review Dg Chest 2 View  09/15/2013   CLINICAL DATA:  Shortness of breath and chest pain.  EXAM: CHEST - 2 VIEW  COMPARISON:  02/17/2010  FINDINGS: The heart size and  mediastinal contours are within normal limits. There is no evidence of pulmonary edema, consolidation, pneumothorax, nodule or pleural fluid. The visualized skeletal structures are unremarkable.  IMPRESSION: No active disease.   Electronically Signed   By: Irish Lack M.D.   On: 09/15/2013 16:09    EKG Interpretation    Date/Time:  Monday September 15 2013 14:40:56 EST Ventricular Rate:  86 PR Interval:  164 QRS Duration: 90 QT Interval:  404 QTC Calculation: 483 R Axis:   9 Text Interpretation:  Normal sinus rhythm Prolonged QT Abnormal ECG When compared with ECG of 17-Feb-2010 19:56, No significant change was found Confirmed by DELOS  MD, Ivelis Norgard ([id number]) on 09/15/2013 3:25:59 PM            MDM  No diagnosis found.  patient is a 42 year old female presents with complaints of chest pain. She states this started yesterday after completing her first day back at work. She worked in a [employer] but denies any specific injury or trauma. Her symptoms are atypical for cardiac pain and are reproducible with palpation of the chest wall. Workup reveals no EKG changes and troponin is negative. I suspect her discomfort is musculoskeletal in nature and feel as though she is stable for discharge. I will recommend NSAIDs and when necessary followup and return.  I personally performed the services described in this documentation, which was scribed in my presence. The recorded information has been reviewed and is accurate.      Geoffery Lyons, MD 09/15/13 [phone]

## 2013-09-15 NOTE — ED Notes (Signed)
Lt chest pain with jaw pain, onset last pm.  Alert, skin warm and dry.  Nausea, No sob

## 2013-11-02 IMAGING — CR DG TIBIA/FIBULA 2V*R*
2 series · 2 of 2 positions shown · non-contrast
Comparison: None.

CLINICAL DATA: Right lower extremity pain.

EXAM:
RIGHT TIBIA AND FIBULA - 2 VIEW

[view not recorded (1 of 2)]
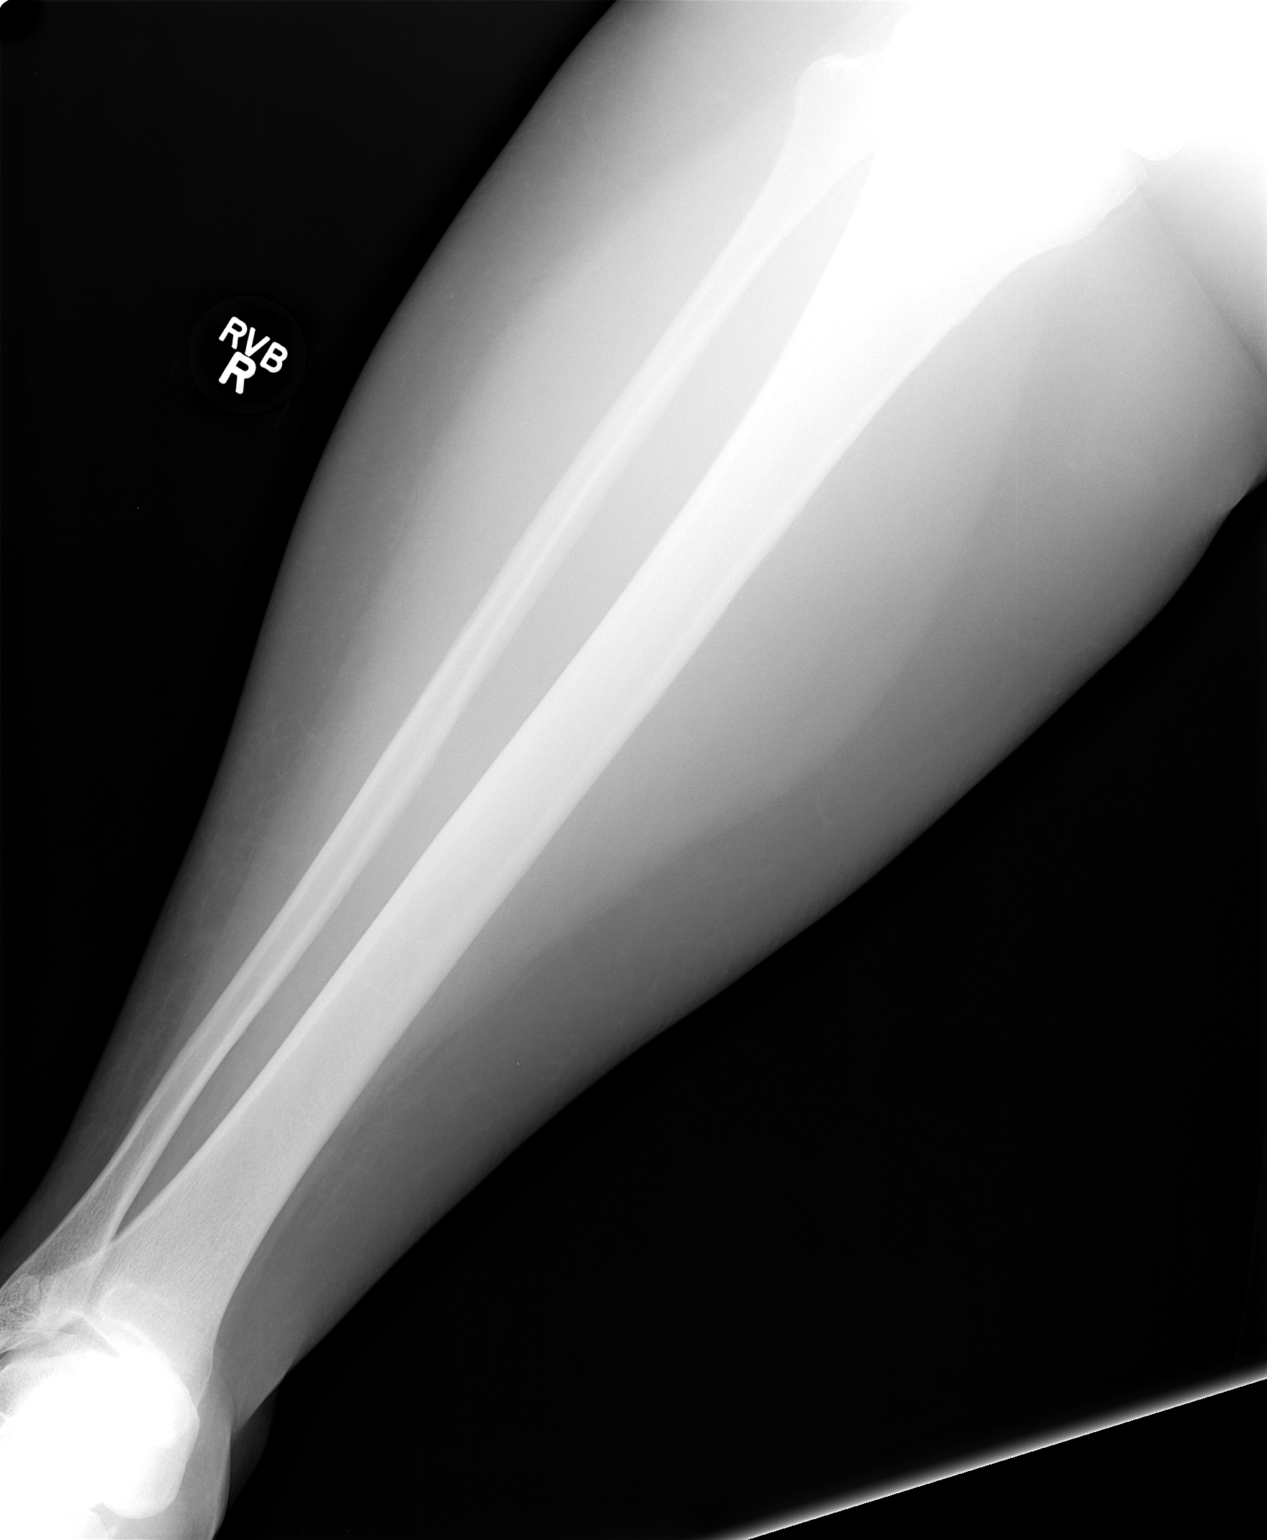

[view not recorded (2 of 2)]
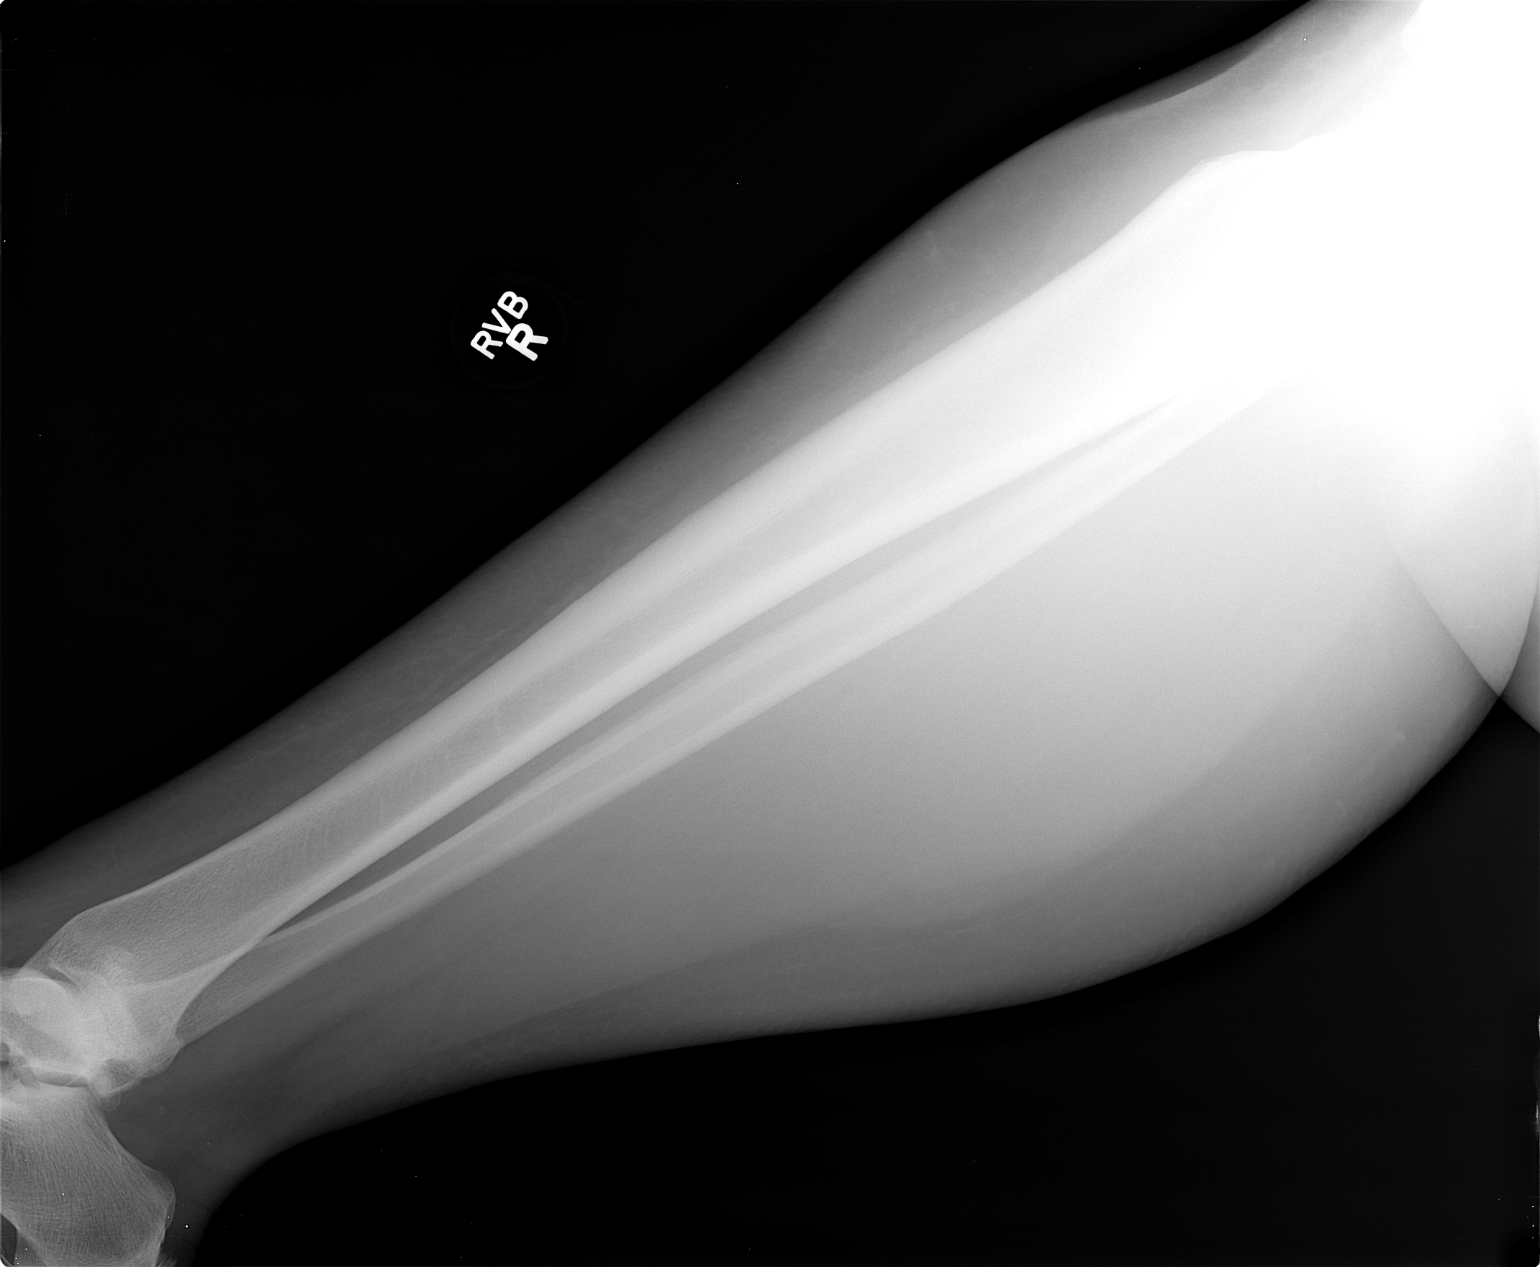

[2 of 2 positions shown; findings below may reference images not displayed]

FINDINGS: There is no evidence of fracture or other focal bone lesions. Soft
tissues are unremarkable.
IMPRESSION: Normal right tibia and fibula.

## 2014-03-10 ENCOUNTER — Encounter: Payer: Self-pay | Admitting: *Deleted

## 2014-04-08 ENCOUNTER — Ambulatory Visit: Payer: Self-pay | Admitting: Gastroenterology

## 2014-04-08 ENCOUNTER — Telehealth: Payer: Self-pay | Admitting: Gastroenterology

## 2014-04-08 NOTE — Telephone Encounter (Signed)
Pt was a no show

## 2014-05-08 ENCOUNTER — Encounter (HOSPITAL_COMMUNITY): Payer: Self-pay | Admitting: Emergency Medicine

## 2014-05-08 ENCOUNTER — Emergency Department (HOSPITAL_COMMUNITY)
Admission: EM | Admit: 2014-05-08 | Discharge: 2014-05-08 | Disposition: A | Discharge status: Home / Self Care | Payer: BC Managed Care – PPO | Attending: Emergency Medicine | Admitting: Emergency Medicine

## 2014-05-08 ENCOUNTER — Emergency Department (HOSPITAL_COMMUNITY): Payer: BC Managed Care – PPO

## 2014-05-08 DIAGNOSIS — R296 Repeated falls: Secondary | ICD-10-CM | POA: Insufficient documentation

## 2014-05-08 DIAGNOSIS — Y9389 Activity, other specified: Secondary | ICD-10-CM | POA: Insufficient documentation

## 2014-05-08 DIAGNOSIS — F411 Generalized anxiety disorder: Secondary | ICD-10-CM | POA: Insufficient documentation

## 2014-05-08 DIAGNOSIS — S8010XA Contusion of unspecified lower leg, initial encounter: Secondary | ICD-10-CM | POA: Insufficient documentation

## 2014-05-08 DIAGNOSIS — Y929 Unspecified place or not applicable: Secondary | ICD-10-CM | POA: Insufficient documentation

## 2014-05-08 DIAGNOSIS — F3289 Other specified depressive episodes: Secondary | ICD-10-CM | POA: Insufficient documentation

## 2014-05-08 DIAGNOSIS — S8011XA Contusion of right lower leg, initial encounter: Secondary | ICD-10-CM

## 2014-05-08 DIAGNOSIS — F329 Major depressive disorder, single episode, unspecified: Secondary | ICD-10-CM | POA: Insufficient documentation

## 2014-05-08 DIAGNOSIS — Z79899 Other long term (current) drug therapy: Secondary | ICD-10-CM | POA: Insufficient documentation

## 2014-05-08 NOTE — ED Notes (Signed)
Larey SeatFell getting out of shower one week ago, has pain right knee to shin not relieved by rest, ice and ibuprofen. Bruise noted right shin

## 2014-05-08 NOTE — Discharge Instructions (Signed)
Apply a heating pad to your leg for 20 minutes 3-4 times daily.  Continue taking your ibuprofen.  Followup with your doctor in one week for a recheck if your symptoms are not improving over the next week.  Your xrays are negative tonight.

## 2014-05-09 NOTE — ED Provider Notes (Signed)
CSN: [id number]     Arrival date & time 05/08/14  1910 History   First MD Initiated Contact with Patient 05/08/14 2050     Chief Complaint  Patient presents with  . Knee Pain     (Consider location/radiation/quality/duration/timing/severity/associated sxs/prior Treatment) HPI   Alexandra Shaffer is a 43 y.o. female presenting with right lower extremity pain since falling getting out of a shower while on vacation one week ago.  She reports aching, stiff pain in her right knee and lower leg which is worsened when she first stands after being sedentary or when she first wakes in the morning.  Her pain improves after she has been on her feet a while.  She denies weakness or numbness in the extremity.  She has taken ibuprofen 600 mg without relief of her symptoms.  She denies other injury.  She has moderate bruising of her leg and was scared by a family member she may have a blood clot, hence her presentation here.   She denies swelling in the leg except locally at the site of the bruising.      Past Medical History  Diagnosis Date  . Anxiety   . Depression    Past Surgical History  Procedure Laterality Date  . Cholecystectomy     No family history on file. History  Substance Use Topics  . Smoking status: Never Smoker   . Smokeless tobacco: Not on file  . Alcohol Use: No   OB History   Grav Para Term Preterm Abortions TAB SAB Ect Mult Living                 Review of Systems  Constitutional: Negative for fever.  Musculoskeletal: Positive for arthralgias. Negative for joint swelling and myalgias.  Skin: Positive for color change. Negative for wound.  Neurological: Negative for weakness and numbness.      Allergies  Codeine and Keflex  Home Medications   Prior to Admission medications   Medication Sig Start Date End Date Taking? Authorizing Provider  ALPRAZolam Prudy Feeler(XANAX) 1 MG tablet Take 0.5 mg by mouth 2 (two) times daily.    Yes Historical Provider, MD  sertraline  (ZOLOFT) 100 MG tablet Take 150 mg by mouth daily at 6 PM.    Yes Historical Provider, MD  hydrochlorothiazide (HYDRODIURIL) 25 MG tablet Take 1 tablet (25 mg total) by mouth daily. 09/15/13   Geoffery Lyonsouglas Delo, MD  ibuprofen (ADVIL,MOTRIN) 200 MG tablet Take 200 mg by mouth every 6 (six) hours as needed.    Historical Provider, MD  ranitidine (ZANTAC) 150 MG tablet Take 150 mg by mouth daily.    Historical Provider, MD   BP 119/60  Pulse 84  Temp(Src) 98.6 F (37 C) (Oral)  Resp 16  Ht 5\' 10"  (1.778 m)  Wt 339 lb (153.769 kg)  BMI 48.64 kg/m2  SpO2 99%  LMP 04/10/2014 Physical Exam  Constitutional: She appears well-developed and well-nourished.  HENT:  Head: Atraumatic.  Neck: Normal range of motion.  Cardiovascular:  Pulses equal bilaterally  Musculoskeletal: She exhibits tenderness. She exhibits no edema.       Legs: Contusions right mid tibia.  No calf tenderness or appreciable edema.  No pain with palpation of right knee.  No knee ligament instability appreciated.  Limited exam secondary body habitus.    Neurological: She is alert. She has normal strength. She displays normal reflexes. No sensory deficit.  Skin: Skin is warm and dry.  Psychiatric: She has a normal mood and  affect.    ED Course  Procedures (including critical care time) Labs Review Labs Reviewed - No data to display  Imaging Review Dg Tibia/fibula Right  05/08/2014   CLINICAL DATA:  Right lower extremity pain.  EXAM: RIGHT TIBIA AND FIBULA - 2 VIEW  COMPARISON:  None.  FINDINGS: There is no evidence of fracture or other focal bone lesions. Soft tissues are unremarkable.  IMPRESSION: Normal right tibia and fibula.   Electronically Signed   By: Roque Lias M.D.   On: 05/08/2014 21:33     EKG Interpretation None      MDM   Final diagnoses:  Contusion of lower extremity, right, initial encounter    Patients labs and/or radiological studies were viewed and considered during the medical decision making  and disposition process. Pt encouraged elevation,  Heat tx,  Continue ibuprofen.  F/u with pcp this week for recheck if not improving.    Burgess Amor, PA-C 05/10/14 [phone]

## 2014-05-18 NOTE — ED Provider Notes (Signed)
Medical screening examination/treatment/procedure(s) were performed by non-physician practitioner and as supervising physician I was immediately available for consultation/collaboration.   EKG Interpretation None        Luiz Trumpower L Isham Smitherman, MD 05/18/14 0708 

## 2014-05-29 ENCOUNTER — Ambulatory Visit (HOSPITAL_COMMUNITY)
Admission: RE | Admit: 2014-05-29 | Discharge: 2014-05-29 | Disposition: A | Discharge status: Home / Self Care | Payer: BC Managed Care – PPO | Source: Ambulatory Visit | Attending: Family Medicine | Admitting: Family Medicine

## 2014-05-29 ENCOUNTER — Other Ambulatory Visit (HOSPITAL_COMMUNITY): Payer: Self-pay | Admitting: Family Medicine

## 2014-05-29 DIAGNOSIS — M25561 Pain in right knee: Secondary | ICD-10-CM

## 2014-05-29 DIAGNOSIS — M1710 Unilateral primary osteoarthritis, unspecified knee: Secondary | ICD-10-CM | POA: Insufficient documentation

## 2014-05-29 DIAGNOSIS — IMO0002 Reserved for concepts with insufficient information to code with codable children: Secondary | ICD-10-CM | POA: Insufficient documentation

## 2014-05-29 IMAGING — CR DG KNEE COMPLETE 4+V*R*
4 series · 4 of 4 positions shown · non-contrast
Comparison: [DATE] right tibia/ fibula radiographs

CLINICAL DATA: Medial right knee pain, recent fall.

EXAM:
RIGHT KNEE - COMPLETE 4+ VIEW

[view not recorded (1 of 4)]
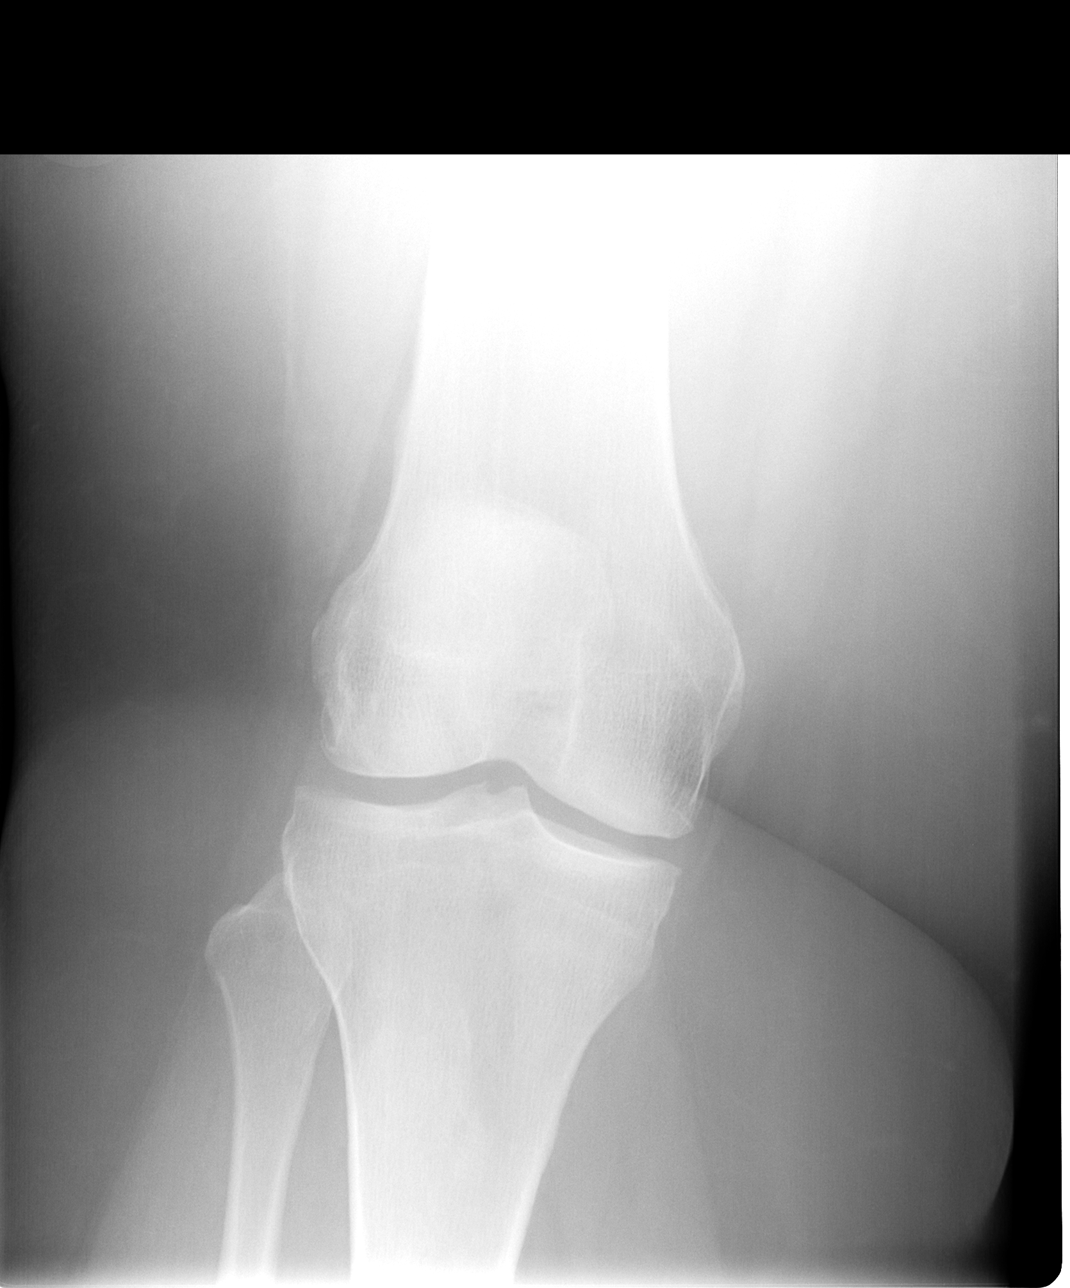

[view not recorded (2 of 4)]
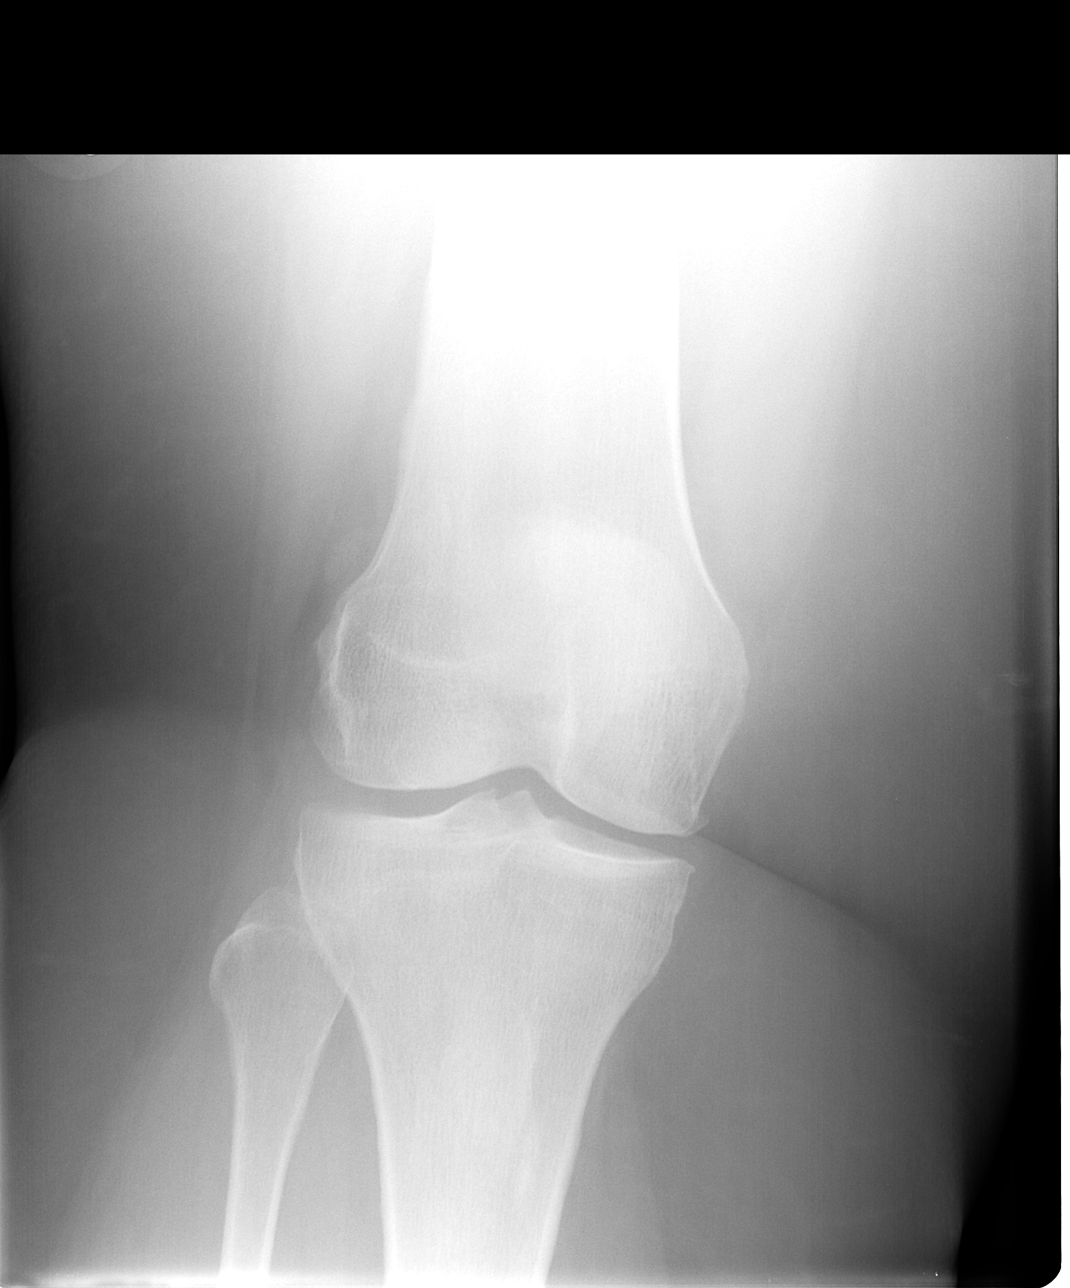

[view not recorded (3 of 4)]
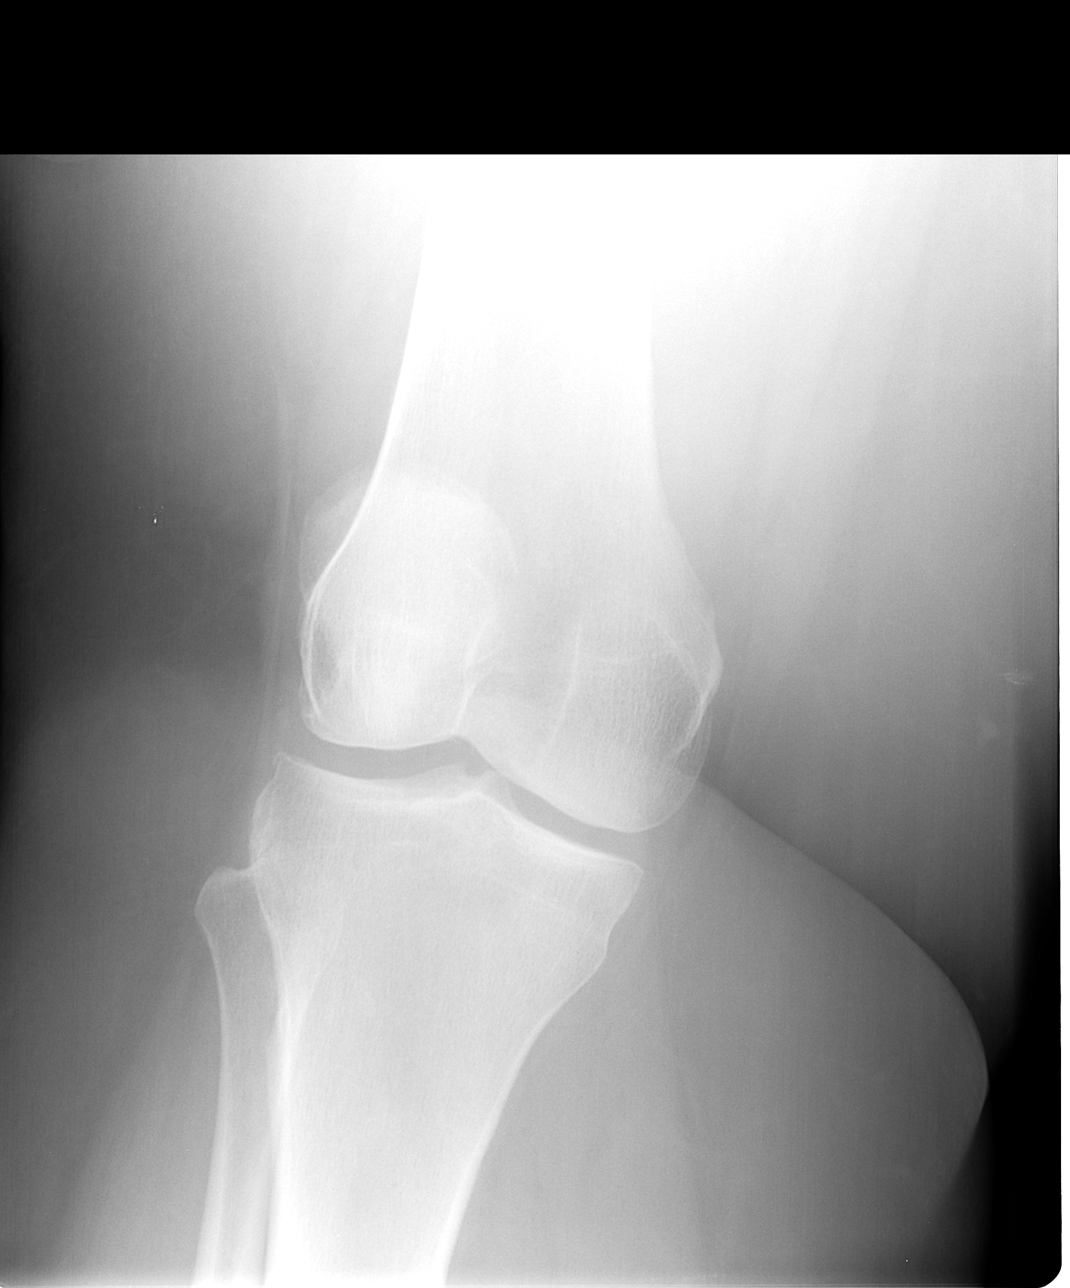

[view not recorded (4 of 4)]
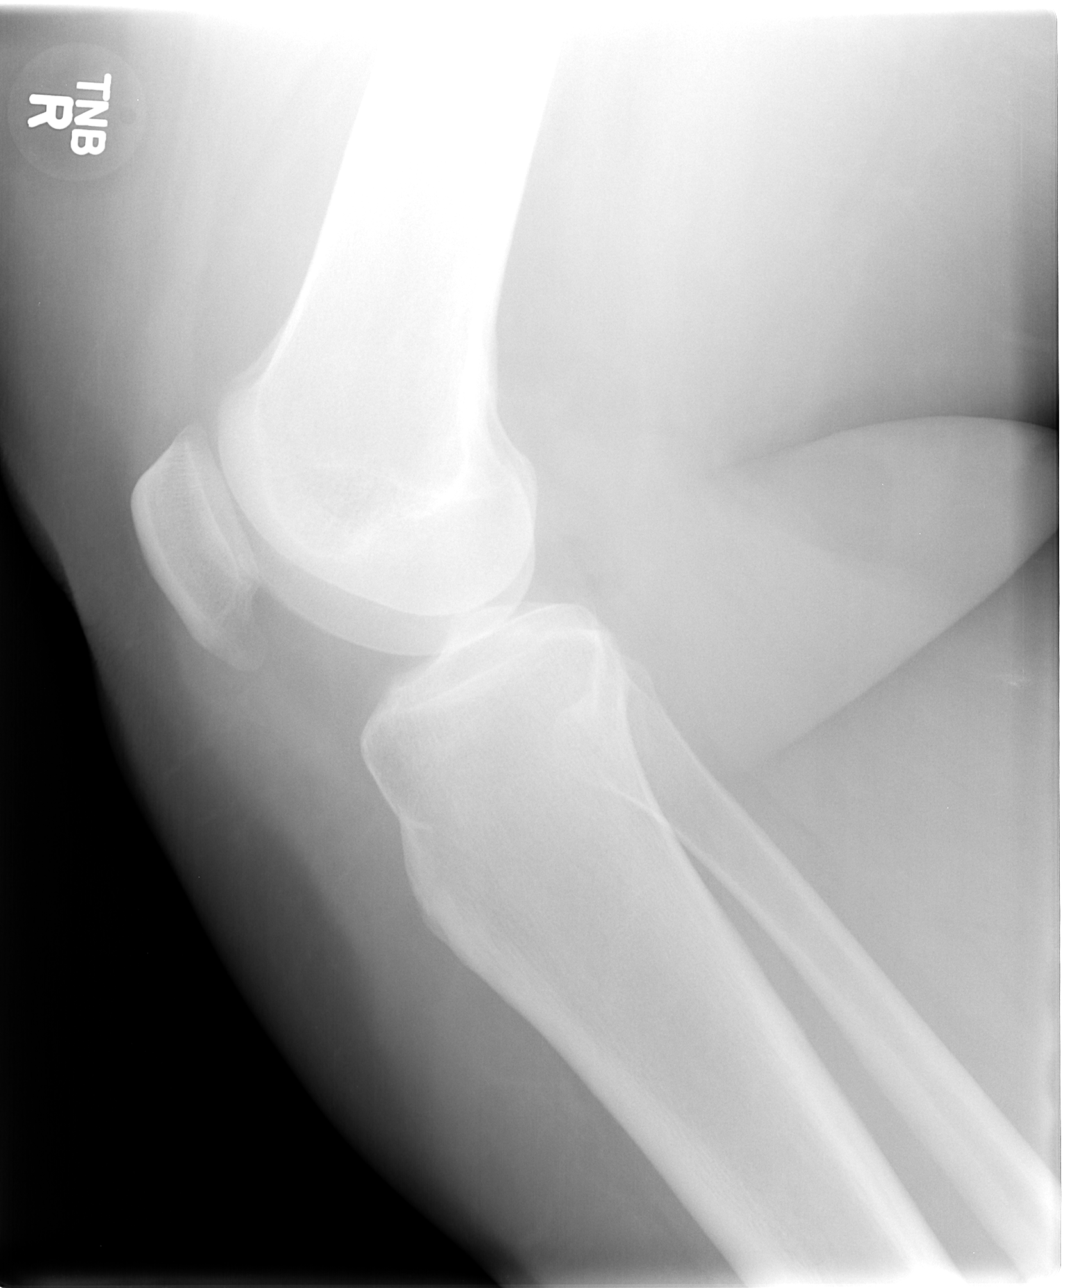

[4 of 4 positions shown; findings below may reference images not displayed]

FINDINGS: Mild medial compartment degenerative change. No displaced acute
fracture or dislocation. No overt joint effusion.
IMPRESSION: No acute or aggressive osseous finding of the right knee. Mild
medial compartment DJD.

## 2014-11-24 ENCOUNTER — Telehealth: Payer: Self-pay | Admitting: Adult Health

## 2014-11-24 NOTE — Telephone Encounter (Signed)
Pt complains of the week before her period either being mean or teary and has hot flashes and dizzy spells, will make appt to be seen

## 2014-11-25 ENCOUNTER — Ambulatory Visit: Payer: Self-pay | Admitting: Adult Health

## 2014-11-26 ENCOUNTER — Encounter: Payer: Self-pay | Admitting: Adult Health

## 2014-11-26 ENCOUNTER — Ambulatory Visit (INDEPENDENT_AMBULATORY_CARE_PROVIDER_SITE_OTHER): Payer: BLUE CROSS/BLUE SHIELD | Admitting: Adult Health

## 2014-11-26 VITALS — BP 138/80 | Ht 70.0 in | Wt 355.6 lb

## 2014-11-26 DIAGNOSIS — R4586 Emotional lability: Secondary | ICD-10-CM

## 2014-11-26 DIAGNOSIS — R232 Flushing: Secondary | ICD-10-CM | POA: Insufficient documentation

## 2014-11-26 DIAGNOSIS — N951 Menopausal and female climacteric states: Secondary | ICD-10-CM

## 2014-11-26 DIAGNOSIS — F39 Unspecified mood [affective] disorder: Secondary | ICD-10-CM | POA: Diagnosis not present

## 2014-11-26 DIAGNOSIS — E669 Obesity, unspecified: Secondary | ICD-10-CM

## 2014-11-26 DIAGNOSIS — Z139 Encounter for screening, unspecified: Secondary | ICD-10-CM

## 2014-11-26 HISTORY — DX: Flushing: R23.2

## 2014-11-26 HISTORY — DX: Emotional lability: R45.86

## 2014-11-26 MED ORDER — HYDROCHLOROTHIAZIDE 25 MG PO TABS: 25.0000 mg | ORAL_TABLET | Freq: Every day | ORAL | Status: DC

## 2014-11-26 NOTE — Progress Notes (Signed)
Subjective:     Patient ID: Alexandra Shaffer, female   DOB: 22-Mar-1971, 44 y.o.   MRN: [id number]  HPI Alexandra Shaffer is a 44 year old white female, married in complaining of hot flashes, and mood swings, esp bad the week before her period.Periods are monthly for about 2 days and they are light.She was switched from Zoloft to Effexor by PCP about 3 months ago, for depression.She has not seen any difference yet.  Review of Systems  See HPI for positives. All other systems negative.  Reviewed past medical,surgical, social and family history. Reviewed medications and allergies.   Objective:   Physical Exam BP 138/80 mmHg  Ht 5\' 10"  (1.778 m)  Wt 355 lb 9.6 oz (161.299 kg)  BMI 51.02 kg/m2  LMP 10/31/2014 Skin warm and dry. Neck: mid line trachea, normal thyroid, good ROM, no lymphadenopathy noted. Lungs: clear to ausculation bilaterally. Cardiovascular: regular rate and rhythm.Discussed could be thyroid,or early menopause, will check labs and get back in for pap and physical.Encouraged to get mammogram.    Assessment:     Hot flashes Mood swings Obesity     Plan:     Check CBC,CMP,TSH,FSH and lipids Return in 1 week for pap and physical Refilled hydrodiuril 25 mg #30 1 daily with 1 refill, til can see PCP

## 2014-11-26 NOTE — Patient Instructions (Signed)
Return in 1 week for pap and physical

## 2014-11-27 LAB — COMPREHENSIVE METABOLIC PANEL
ALBUMIN: 4.6 g/dL (ref 3.5–5.5)
ALK PHOS: 66 IU/L (ref 39–117)
ALT: 9 IU/L (ref 0–32)
AST: 10 IU/L (ref 0–40)
Albumin/Globulin Ratio: 2 (ref 1.1–2.5)
BUN/Creatinine Ratio: 13 (ref 9–23)
BUN: 9 mg/dL (ref 6–24)
CO2: 26 mmol/L (ref 18–29)
CREATININE: 0.7 mg/dL (ref 0.57–1.00)
Calcium: 9 mg/dL (ref 8.7–10.2)
Chloride: 102 mmol/L (ref 97–108)
GFR, EST AFRICAN AMERICAN: 124 mL/min/{1.73_m2} (ref >59)
GFR, EST NON AFRICAN AMERICAN: 108 mL/min/{1.73_m2} (ref >59)
GLUCOSE: 97 mg/dL (ref 65–99)
Globulin, Total: 2.3 g/dL (ref 1.5–4.5)
Potassium: 4.7 mmol/L (ref 3.5–5.2)
SODIUM: 143 mmol/L (ref 134–144)
TOTAL PROTEIN: 6.9 g/dL (ref 6.0–8.5)
Total Bilirubin: 0.4 mg/dL (ref 0.0–1.2)

## 2014-11-27 LAB — CBC
HEMATOCRIT: 43 % (ref 34.0–46.6)
Hemoglobin: 14.5 g/dL (ref 11.1–15.9)
MCH: 29.6 pg (ref 26.6–33.0)
MCHC: 33.7 g/dL (ref 31.5–35.7)
MCV: 88 fL (ref 79–97)
Platelets: 211 10*3/uL (ref 150–379)
RBC: 4.9 x10E6/uL (ref 3.77–5.28)
RDW: 14.6 % (ref 12.3–15.4)
WBC: 6.2 10*3/uL (ref 3.4–10.8)

## 2014-11-27 LAB — LIPID PANEL
CHOL/HDL RATIO: 3.2 ratio (ref 0.0–4.4)
Cholesterol, Total: 165 mg/dL (ref 100–199)
HDL: 51 mg/dL (ref >39)
LDL Calculated: 92 mg/dL (ref 0–99)
TRIGLYCERIDES: 109 mg/dL (ref 0–149)
VLDL Cholesterol Cal: 22 mg/dL (ref 5–40)

## 2014-11-27 LAB — FOLLICLE STIMULATING HORMONE: FSH: 9.1 m[IU]/mL

## 2014-11-27 LAB — TSH: TSH: 1.34 u[IU]/mL (ref 0.450–4.500)

## 2014-11-30 ENCOUNTER — Telehealth: Payer: Self-pay | Admitting: Adult Health

## 2014-11-30 NOTE — Telephone Encounter (Signed)
Pt aware of labs, will talk more at appt this week

## 2014-12-01 ENCOUNTER — Other Ambulatory Visit: Payer: BLUE CROSS/BLUE SHIELD | Admitting: Adult Health

## 2014-12-07 ENCOUNTER — Other Ambulatory Visit: Payer: BLUE CROSS/BLUE SHIELD | Admitting: Adult Health

## 2014-12-15 ENCOUNTER — Other Ambulatory Visit: Payer: BLUE CROSS/BLUE SHIELD | Admitting: Adult Health

## 2014-12-25 ENCOUNTER — Other Ambulatory Visit: Payer: BLUE CROSS/BLUE SHIELD | Admitting: Adult Health

## 2016-06-13 ENCOUNTER — Emergency Department (HOSPITAL_COMMUNITY)
Admission: EM | Admit: 2016-06-13 | Discharge: 2016-06-13 | Disposition: A | Discharge status: Home / Self Care | Payer: Self-pay | Attending: Emergency Medicine | Admitting: Emergency Medicine

## 2016-06-13 ENCOUNTER — Encounter (HOSPITAL_COMMUNITY): Payer: Self-pay

## 2016-06-13 DIAGNOSIS — Z87891 Personal history of nicotine dependence: Secondary | ICD-10-CM | POA: Insufficient documentation

## 2016-06-13 DIAGNOSIS — H109 Unspecified conjunctivitis: Secondary | ICD-10-CM | POA: Insufficient documentation

## 2016-06-13 MED ORDER — TETRACAINE HCL 0.5 % OP SOLN
OPHTHALMIC | Status: AC
  Administered 2016-06-13: 2 [drp] via OPHTHALMIC
  Filled 2016-06-13: qty 4

## 2016-06-13 MED ORDER — TETRACAINE HCL 0.5 % OP SOLN
2.0000 [drp] | Freq: Once | OPHTHALMIC | Status: AC
  Administered 2016-06-13: 2 [drp] via OPHTHALMIC

## 2016-06-13 MED ORDER — KETOROLAC TROMETHAMINE 0.5 % OP SOLN
1.0000 [drp] | Freq: Once | OPHTHALMIC | Status: AC
  Administered 2016-06-13: 1 [drp] via OPHTHALMIC
  Filled 2016-06-13: qty 5

## 2016-06-13 MED ORDER — TOBRAMYCIN 0.3 % OP SOLN
1.0000 [drp] | Freq: Once | OPHTHALMIC | Status: AC
  Administered 2016-06-13: 1 [drp] via OPHTHALMIC
  Filled 2016-06-13: qty 5

## 2016-06-13 MED ORDER — FLUORESCEIN SODIUM 1 MG OP STRP
1.0000 | ORAL_STRIP | Freq: Once | OPHTHALMIC | Status: AC
  Administered 2016-06-13: 09:00:00 via OPHTHALMIC

## 2016-06-13 MED ORDER — FLUORESCEIN SODIUM 1 MG OP STRP
ORAL_STRIP | OPHTHALMIC | Status: AC
  Filled 2016-06-13: qty 1

## 2016-06-13 NOTE — ED Provider Notes (Signed)
[facility]-EMERGENCY DEPT Provider Note   CSN: [id number] Arrival date & time: 06/13/16  [id number]     History   Chief Complaint Chief Complaint  Patient presents with  . Eye Pain    HPI Alexandra Shaffer is a 45 y.o. female.  HPI   Alexandra Shaffer is a 45 y.o. female who presents to the Emergency Department complaining of redness itching and drainage of the right eye for two days. 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  She also noticed a "sore knot" in front of her right ear this morning.  She also has associated nasal congestion and "scratchy throat."  She denies fever, neck pain or stiffness, headache, visual changes or contact use.    Past Medical History:  Diagnosis Date  . Anxiety   . Depression   . Hot flashes 11/26/2014  . Mood swings (HCC) 11/26/2014    Patient Active Problem List   Diagnosis Date Noted  . Hot flashes 11/26/2014  . Mood swings (HCC) 11/26/2014    Past Surgical History:  Procedure Laterality Date  . CHOLECYSTECTOMY      OB History    Gravida Para Term Preterm AB Living   2 2       2    SAB TAB Ectopic Multiple Live Births                   Home Medications    Prior to Admission medications   Medication Sig Start Date End Date Taking? Authorizing Provider  ALPRAZolam Prudy Feeler(XANAX) 1 MG tablet Take 1 mg by mouth 3 (three) times daily.     Historical Provider, MD  hydrochlorothiazide (HYDRODIURIL) 25 MG tablet Take 1 tablet (25 mg total) by mouth daily. 11/26/14   Adline PotterJennifer A Griffin, NP  ibuprofen (ADVIL,MOTRIN) 200 MG tablet Take 200 mg by mouth every 6 (six) hours as needed.    Historical Provider, MD  ranitidine (ZANTAC) 150 MG tablet Take 150 mg by mouth daily.    Historical Provider, MD  venlafaxine XR (EFFEXOR-XR) 37.5 MG 24 hr capsule 37.5 mg. Takes 2 daily. 11/07/14   Historical Provider, MD    Family History Family History  Problem Relation Age of Onset  . Anxiety disorder Mother   .  Stroke Father   . Hypertension Father   . Diabetes Father   . Other Father     has a pacemaker  . Anxiety disorder Sister   . Depression Sister   . Hyperlipidemia Daughter   . Cancer Maternal Grandmother   . Diabetes Maternal Grandmother   . Stroke Maternal Grandmother   . Heart disease Maternal Grandmother   . Kidney disease Maternal Grandmother   . Cancer Maternal Grandfather   . Emphysema Paternal Grandmother   . Stroke Paternal Grandmother   . Heart disease Paternal Grandmother   . Diabetes Paternal Grandmother   . Other Paternal Grandfather     "black lung"    Social History Social History  Substance Use Topics  . Smoking status: Former Smoker    Types: Cigarettes  . Smokeless tobacco: Never Used  . Alcohol use No     Allergies   Codeine and Keflex [cephalexin]   Review of Systems Review of Systems  Constitutional: Negative for activity change, appetite change, chills and fever.  HENT: Positive for congestion, rhinorrhea and sore throat. Negative for facial swelling and trouble swallowing.   Eyes: Positive for pain, discharge, redness and itching.  Negative for visual disturbance.  Respiratory: Negative for cough, chest tightness, shortness of breath, wheezing and stridor.   Cardiovascular: Negative for chest pain.  Gastrointestinal: Negative for nausea and vomiting.  Musculoskeletal: Negative for neck pain and neck stiffness.  Skin: Negative.   Neurological: Negative for dizziness, weakness, numbness and headaches.  Hematological: Negative for adenopathy.  Psychiatric/Behavioral: Negative for confusion.  All other systems reviewed and are negative.    Physical Exam Updated Vital Signs BP 123/74 (BP Location: Right Wrist)   Pulse 76   Temp 97.9 F (36.6 C) (Oral)   Resp 17   Ht 5\' 10"  (1.778 m)   Wt (!) 156 kg   LMP 06/03/2016   SpO2 99%   BMI 49.36 kg/m   Physical Exam  Constitutional: She is oriented to person, place, and time. She appears  well-developed and well-nourished. No distress.  HENT:  Head: Normocephalic and atraumatic.  Right Ear: Tympanic membrane and ear canal normal.  Left Ear: Tympanic membrane and ear canal normal.  Nose: Mucosal edema present.  Mouth/Throat: Uvula is midline, oropharynx is clear and moist and mucous membranes are normal.  Eyes: EOM are normal. Pupils are equal, round, and reactive to light. Lids are everted and swept, no foreign bodies found. Right eye exhibits discharge. Right eye exhibits no chemosis and no hordeolum. No foreign body present in the right eye. Right conjunctiva is injected. Left conjunctiva is not injected.  Slit lamp exam:      The right eye shows no corneal abrasion, no corneal ulcer, no hyphema and no fluorescein uptake.  Neck: Normal range of motion. Neck supple. No thyromegaly present.  Cardiovascular: Normal rate, regular rhythm and intact distal pulses.   No murmur heard. Pulmonary/Chest: Effort normal and breath sounds normal. No respiratory distress.  Musculoskeletal: Normal range of motion.  Lymphadenopathy:       Head (right side): Preauricular adenopathy present. No posterior auricular adenopathy present.       Head (left side): No preauricular and no posterior auricular adenopathy present.    She has no cervical adenopathy.  Neurological: She is alert and oriented to person, place, and time. She exhibits normal muscle tone. Coordination normal.  Skin: Skin is warm and dry. No rash noted.  Psychiatric: She has a normal mood and affect.  Nursing note and vitals reviewed.    ED Treatments / Results  Labs (all labs ordered are listed, but only abnormal results are displayed) Labs Reviewed - No data to display  EKG  EKG Interpretation None       Radiology No results found.  Procedures Procedures (including critical care time)          Medications Ordered in ED Medications  fluorescein ophthalmic strip 1 strip ( Right Eye Given 06/13/16 0909)    tetracaine (PONTOCAINE) 0.5 % ophthalmic solution 2 drop (2 drops Right Eye Given 06/13/16 0909)  tobramycin (TOBREX) 0.3 % ophthalmic solution 1 drop (1 drop Right Eye Given 06/13/16 1015)  ketorolac (ACULAR) 0.5 % ophthalmic solution 1 drop (1 drop Right Eye Given 06/13/16 1015)     Initial Impression / Assessment and Plan / ED Course  I have reviewed the triage vital signs and the nursing notes.  Pertinent labs & imaging results that were available during my care of the patient were reviewed by me and considered in my medical decision making (see chart for details).  Clinical Course    Pt is well appearing.  conjunctivitis of the right eye with preauricular lymphadenopathy.  Agrees to tx with warm compresses, tobramycin and ketorolac drops.  Referral given for Dr. Lita MainsHaines   Final Clinical Impressions(s) / ED Diagnoses   Final diagnoses:  Conjunctivitis of right eye    New Prescriptions New Prescriptions   No medications on file     Pauline Ausammy Jakie Debow, Cordelia Poche-C 06/14/16 2153    Samuel JesterKathleen McManus, DO 06/17/16 1537

## 2016-06-13 NOTE — Discharge Instructions (Signed)
Apply 1 drop of the ketorolac to the right eye 4 times a day and 1 drop of the tobramycin every 4 hrs. For 5-7 days.  Warm wet compresses on/off to your eye.  Call Dr. Lita MainsHaines office to arrange a follow-up appt if not improving

## 2016-06-13 NOTE — ED Triage Notes (Signed)
Pt reports r eye red, itchy, draining, and irritated for past couple of days.  Also reports a knot on r side of face in front of ear that is tender to touch.

## 2016-06-15 ENCOUNTER — Encounter (HOSPITAL_COMMUNITY): Payer: Self-pay | Admitting: Emergency Medicine

## 2016-06-15 ENCOUNTER — Emergency Department (HOSPITAL_COMMUNITY): Payer: Self-pay

## 2016-06-15 ENCOUNTER — Emergency Department (HOSPITAL_COMMUNITY)
Admission: EM | Admit: 2016-06-15 | Discharge: 2016-06-15 | Disposition: A | Discharge status: Home / Self Care | Payer: Self-pay | Attending: Emergency Medicine | Admitting: Emergency Medicine

## 2016-06-15 DIAGNOSIS — H05011 Cellulitis of right orbit: Secondary | ICD-10-CM | POA: Insufficient documentation

## 2016-06-15 DIAGNOSIS — L03213 Periorbital cellulitis: Secondary | ICD-10-CM

## 2016-06-15 DIAGNOSIS — Z87891 Personal history of nicotine dependence: Secondary | ICD-10-CM | POA: Insufficient documentation

## 2016-06-15 LAB — CBC WITH DIFFERENTIAL/PLATELET
Basophils Absolute: 0 10*3/uL (ref 0.0–0.1)
Basophils Relative: 0 %
EOS ABS: 0.2 10*3/uL (ref 0.0–0.7)
EOS PCT: 3 %
HCT: 53.5 % — ABNORMAL HIGH (ref 36.0–46.0)
Hemoglobin: 17.8 g/dL — ABNORMAL HIGH (ref 12.0–15.0)
LYMPHS ABS: 1.7 10*3/uL (ref 0.7–4.0)
Lymphocytes Relative: 31 %
MCH: 28.8 pg (ref 26.0–34.0)
MCHC: 33.3 g/dL (ref 30.0–36.0)
MCV: 86.7 fL (ref 78.0–100.0)
MONOS PCT: 8 %
Monocytes Absolute: 0.4 10*3/uL (ref 0.1–1.0)
Neutro Abs: 3.1 10*3/uL (ref 1.7–7.7)
Neutrophils Relative %: 57 %
PLATELETS: 116 10*3/uL — AB (ref 150–400)
RBC: 6.17 MIL/uL — ABNORMAL HIGH (ref 3.87–5.11)
RDW: 14.3 % (ref 11.5–15.5)
WBC: 5.4 10*3/uL (ref 4.0–10.5)

## 2016-06-15 LAB — BASIC METABOLIC PANEL
Anion gap: 7 (ref 5–15)
BUN: 8 mg/dL (ref 6–20)
CO2: 27 mmol/L (ref 22–32)
Calcium: 9.1 mg/dL (ref 8.9–10.3)
Chloride: 102 mmol/L (ref 101–111)
Creatinine, Ser: 0.85 mg/dL (ref 0.44–1.00)
GFR calc Af Amer: >60 mL/min (ref >60)
GFR calc non Af Amer: >60 mL/min (ref >60)
Glucose, Bld: 105 mg/dL — ABNORMAL HIGH (ref 65–99)
Potassium: 3.2 mmol/L — ABNORMAL LOW (ref 3.5–5.1)
SODIUM: 136 mmol/L (ref 135–145)

## 2016-06-15 IMAGING — CT CT MAXILLOFACIAL W/ CM
3 series · 15 of 47 positions shown, 18 images · IV contrast (iopamidol)
Comparison: None.

CLINICAL DATA: Right periorbital itching, drainage, and tenderness.
Concern for orbital cellulitis.

EXAM:
CT MAXILLOFACIAL WITH CONTRAST
TECHNIQUE: Multidetector CT imaging of the maxillofacial structures was
performed with intravenous contrast. Multiplanar CT image
reconstructions were also generated. A small metallic BB was placed
on the right temple in order to reliably differentiate right from
left.
CONTRAST:  75mL [VL] IOPAMIDOL ([VL]) INJECTION 61%

[Series 2: facial/ orbits 2.0 h30s · axial · 0.38mm/px · z∈[+1260,+1412]mm · 9 of 90 slices shown, 12 images]
[im 7/90  brain]
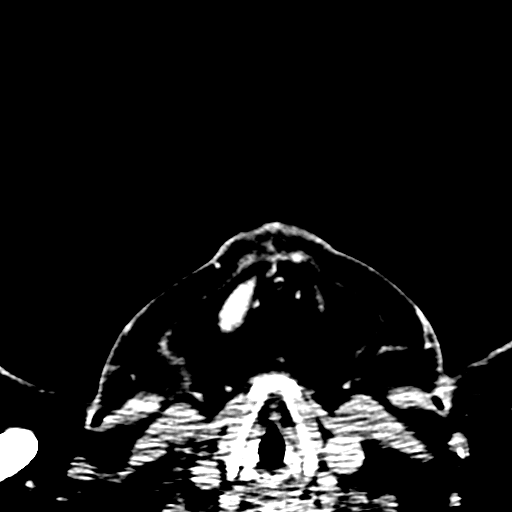
[im 7/90  bone]
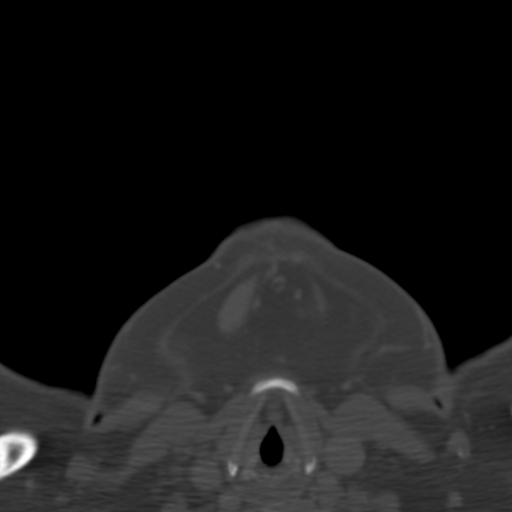
[im 16/90  bone]
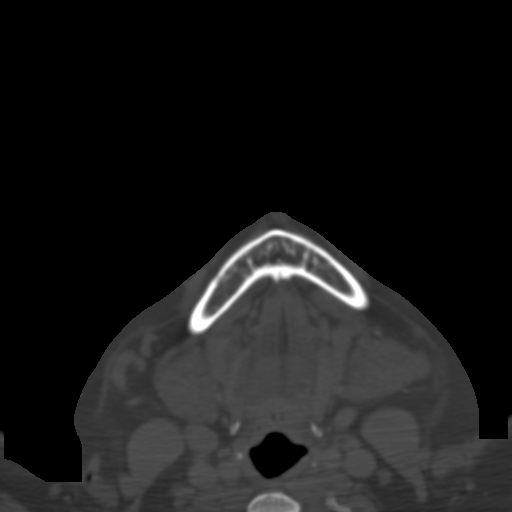
[im 25/90  bone]
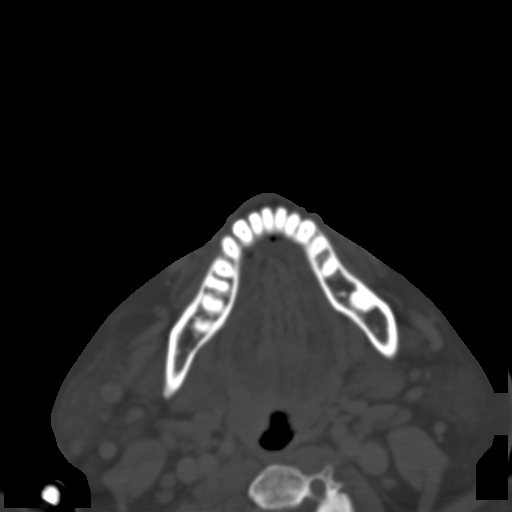
[im 34/90  bone]
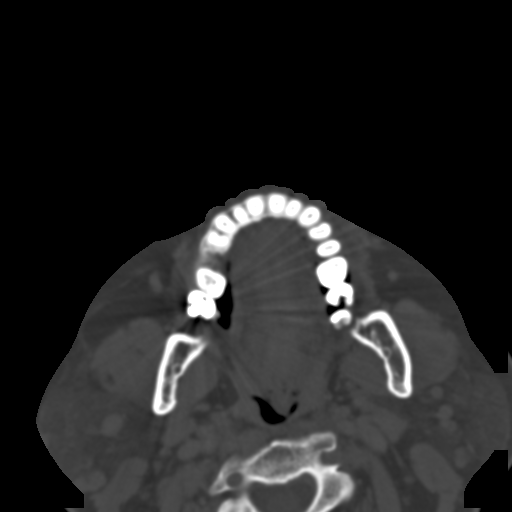
[im 47/90  brain]
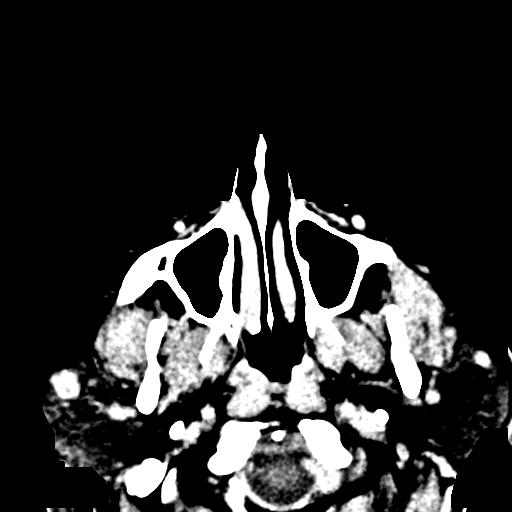
[im 47/90  bone]
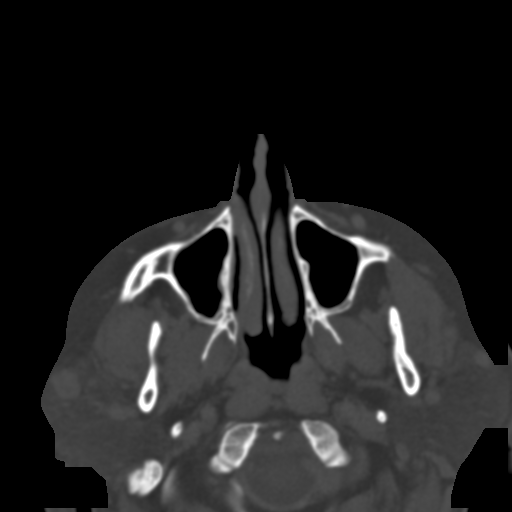
[im 56/90  bone]
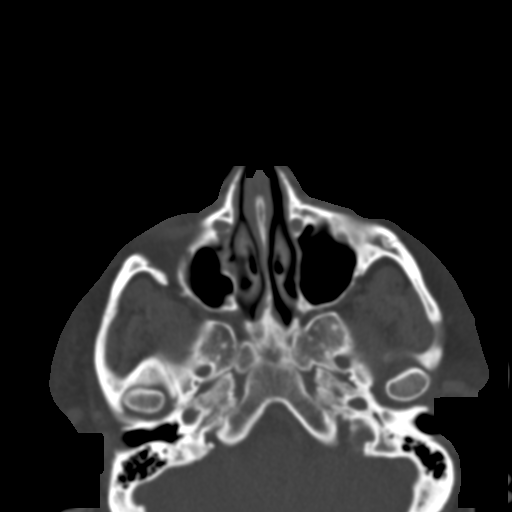
[im 65/90  bone]
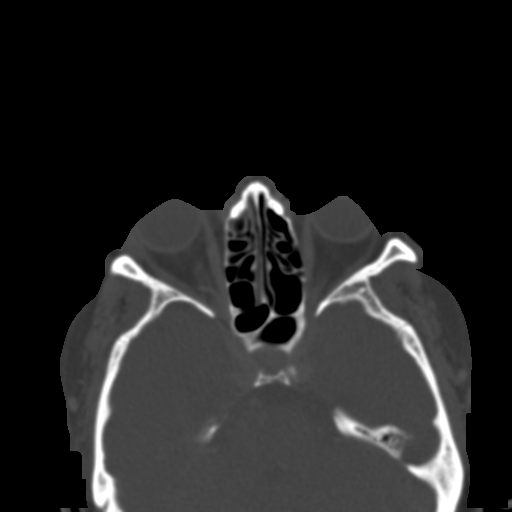
[im 74/90  bone]
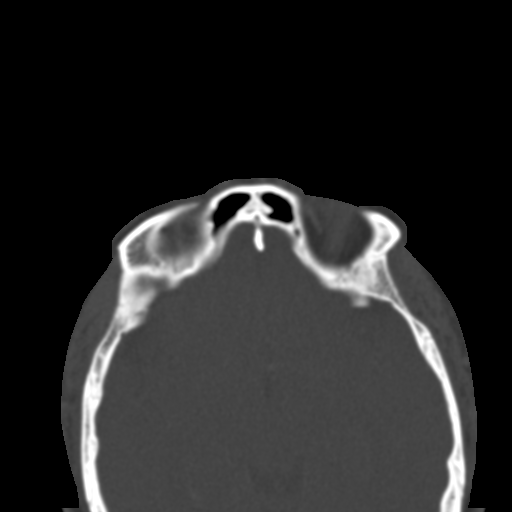
[im 83/90  brain]
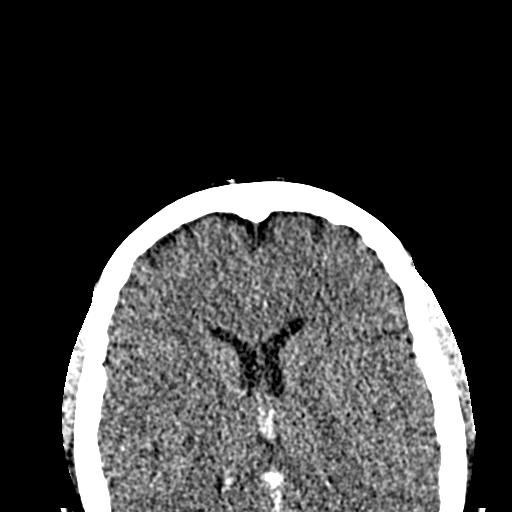
[im 83/90  bone]
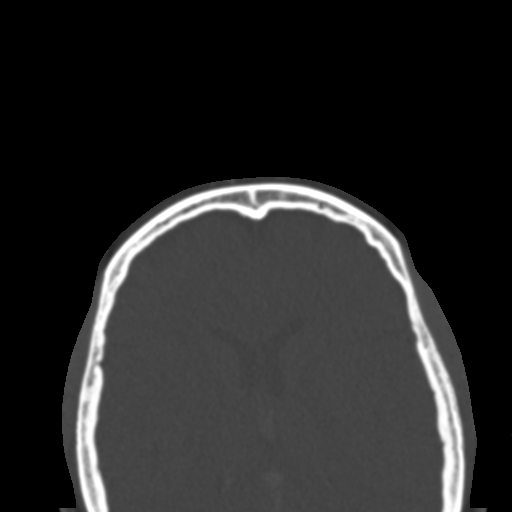

[Series 6: coronal soft tissue · coronal · 0.36mm/px · 3 of 65 slices shown]
[im 22/65  bone]
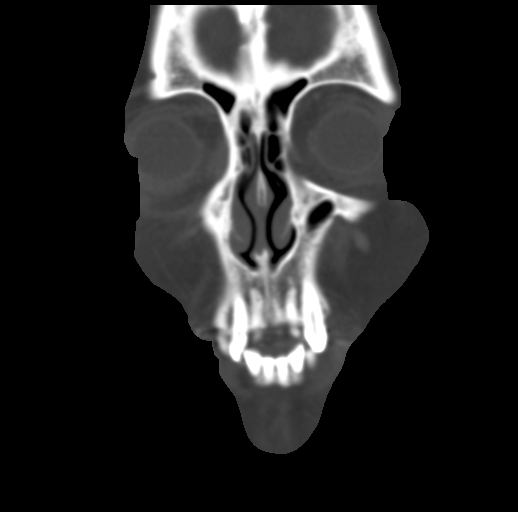
[im 29/65  bone]
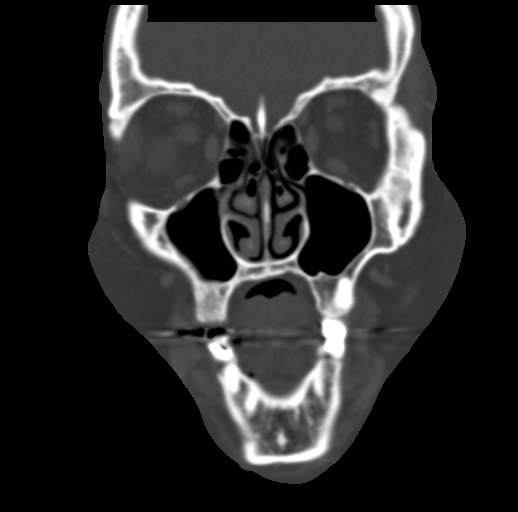
[im 36/65  bone]
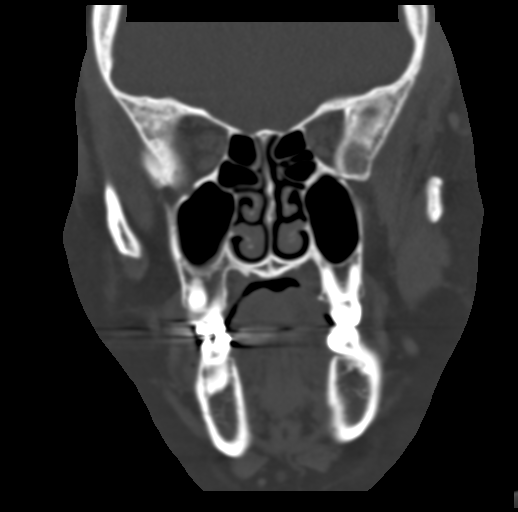

[Series 7: sagittal soft tissue · sagittal · 0.28mm/px · 3 of 84 slices shown]
[im 28/84  bone]
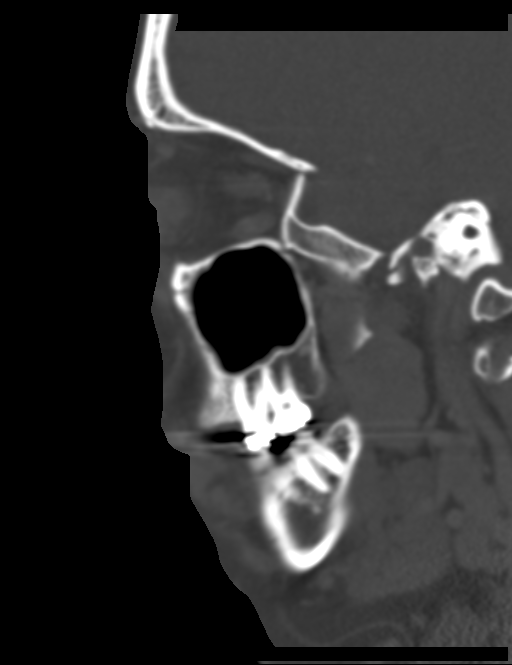
[im 42/84  bone]
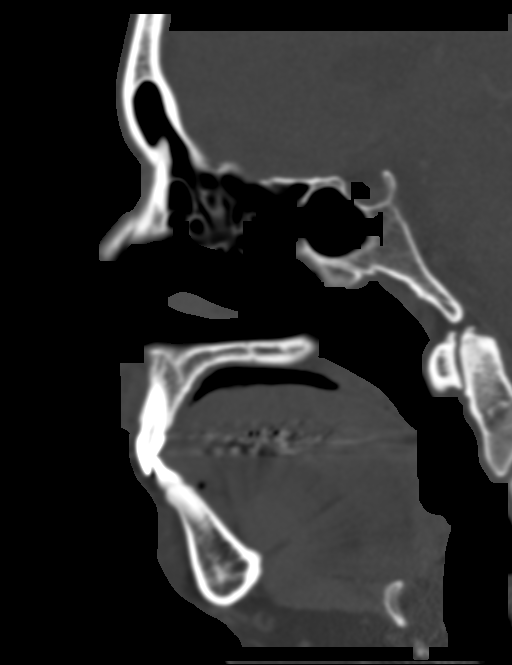
[im 56/84  bone]
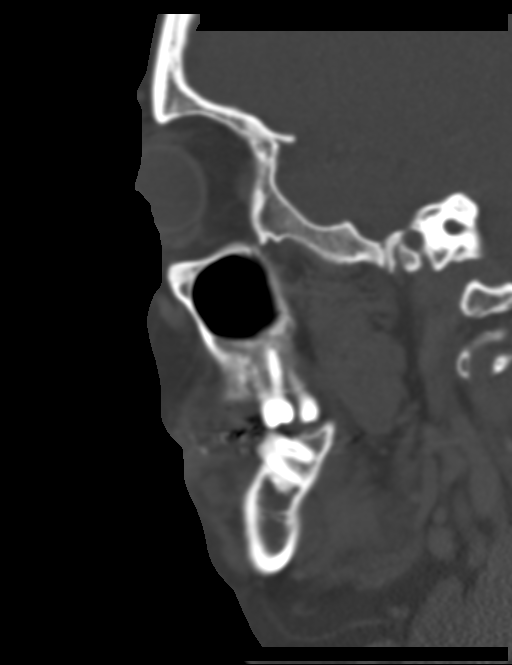

[15 of 47 positions shown; findings below may reference images not displayed]

FINDINGS: Right periorbital skin thickening and subcutaneous soft tissue
edema. No abscess. No postseptal fat stranding or fluid. Intraconal
fat is homogeneous. The globe and extraocular muscles are intact. No
osseous abnormality. No subperiosteal collection. Paranasal sinuses
are well-aerated without fluid level or mucosal thickening. No
periapical lucency or evidence of odontogenic infection.

Soft tissue nodules in both parotid glands, more prominent on the
right, have fatty hila and are typical appearance for lymph nodes.
Prominent submandibular lymph node and scattered nodes in the
cervical chain are likely reactive.

Left orbit and globe are intact. Visualized intracranial structures
are normal.
IMPRESSION: 1. Mild right periorbital inflammation which may reflect preseptal
cellulitis. No postseptal changes. No abscess.
2. Prominent right-sided lymph nodes are likely reactive, including
intra-parotid nodes.

## 2016-06-15 MED ORDER — ERYTHROMYCIN 5 MG/GM OP OINT
1.0000 "application " | TOPICAL_OINTMENT | Freq: Once | OPHTHALMIC | Status: AC
  Administered 2016-06-15: 1 via OPHTHALMIC
  Filled 2016-06-15: qty 3.5

## 2016-06-15 MED ORDER — IOPAMIDOL (ISOVUE-300) INJECTION 61%
INTRAVENOUS | Status: AC
  Administered 2016-06-15: 75 mL
  Filled 2016-06-15: qty 75

## 2016-06-15 MED ORDER — CLINDAMYCIN HCL 150 MG PO CAPS: 450.0000 mg | ORAL_CAPSULE | Freq: Three times a day (TID) | ORAL | 0 refills | Status: AC

## 2016-06-15 MED ORDER — ERYTHROMYCIN 5 MG/GM OP OINT: TOPICAL_OINTMENT | OPHTHALMIC | 0 refills | Status: DC

## 2016-06-15 MED ORDER — ALBUTEROL SULFATE HFA 108 (90 BASE) MCG/ACT IN AERS: 2.0000 | INHALATION_SPRAY | Freq: Once | RESPIRATORY_TRACT | Status: DC

## 2016-06-15 MED ORDER — CLINDAMYCIN HCL 150 MG PO CAPS
450.0000 mg | ORAL_CAPSULE | Freq: Once | ORAL | Status: AC
Start: 2016-06-15 — End: 2016-06-15
  Administered 2016-06-15: 450 mg via ORAL
  Filled 2016-06-15: qty 3

## 2016-06-15 MED ORDER — SODIUM CHLORIDE 0.9 % IV BOLUS (SEPSIS)
1000.0000 mL | Freq: Once | INTRAVENOUS | Status: AC
  Administered 2016-06-15: 1000 mL via INTRAVENOUS

## 2016-06-15 NOTE — ED Provider Notes (Signed)
[facility]-EMERGENCY DEPT Provider Note   CSN: [id number] Arrival date & time: 06/15/16  1623     History   Chief Complaint Chief Complaint  Patient presents with  . Eye Problem    HPI Alexandra Shaffer is a 45 y.o. female.  HPI  Patient with no significant past medical history presents for right eye problem. She reports red eye and drainage. She was seen in an outside ED 1 week ago, diagnosed conjunctivae doesn't given eyedrops. She's had increased swelling to that eye with pain surrounding the eye since then. She saw her PCP today, was concerned for cellulitis and center to be evaluated here. She has had a mild headache that is now resolved with Tylenol. Denies any blurry vision. Denies pain with eye movement. Denies fevers at home.   Past Medical History:  Diagnosis Date  . Anxiety   . Depression   . Hot flashes 11/26/2014  . Mood swings (HCC) 11/26/2014    Patient Active Problem List   Diagnosis Date Noted  . Hot flashes 11/26/2014  . Mood swings (HCC) 11/26/2014    Past Surgical History:  Procedure Laterality Date  . CHOLECYSTECTOMY      OB History    Gravida Para Term Preterm AB Living   2 2       2    SAB TAB Ectopic Multiple Live Births                   Home Medications    Prior to Admission medications   Medication Sig Start Date End Date Taking? Authorizing Provider  acetaminophen (TYLENOL) 500 MG tablet Take 1,000 mg by mouth every 6 (six) hours as needed.    Historical Provider, MD  ALPRAZolam Prudy Feeler(XANAX) 1 MG tablet Take 1 mg by mouth 3 (three) times daily.     Historical Provider, MD  escitalopram (LEXAPRO) 20 MG tablet Take 20 mg by mouth daily.    Historical Provider, MD  ibuprofen (ADVIL,MOTRIN) 200 MG tablet Take 200 mg by mouth every 6 (six) hours as needed.    Historical Provider, MD  ranitidine (ZANTAC) 150 MG tablet Take 150 mg by mouth daily.    Historical Provider, MD    Family History Family History  Problem Relation Age of Onset  . Anxiety  disorder Mother   . Stroke Father   . Hypertension Father   . Diabetes Father   . Other Father     has a pacemaker  . Anxiety disorder Sister   . Depression Sister   . Hyperlipidemia Daughter   . Cancer Maternal Grandmother   . Diabetes Maternal Grandmother   . Stroke Maternal Grandmother   . Heart disease Maternal Grandmother   . Kidney disease Maternal Grandmother   . Cancer Maternal Grandfather   . Emphysema Paternal Grandmother   . Stroke Paternal Grandmother   . Heart disease Paternal Grandmother   . Diabetes Paternal Grandmother   . Other Paternal Grandfather     "black lung"    Social History Social History  Substance Use Topics  . Smoking status: Former Smoker    Types: Cigarettes  . Smokeless tobacco: Never Used  . Alcohol use No     Allergies   Codeine and Keflex [cephalexin]   Review of Systems Review of Systems  Constitutional: Negative for chills and fever.  HENT: Negative for ear pain and sore throat.   Eyes: Positive for discharge and redness. Negative for pain and visual disturbance.  Respiratory: Negative for cough and shortness  of breath.   Cardiovascular: Negative for chest pain and palpitations.  Gastrointestinal: Negative for abdominal pain and vomiting.  Genitourinary: Negative for dysuria and hematuria.  Musculoskeletal: Negative for arthralgias and back pain.  Skin: Negative for color change and rash.  Neurological: Negative for seizures and syncope.  All other systems reviewed and are negative.    Physical Exam Updated Vital Signs BP 150/73   Pulse 77   Temp 98.8 F (37.1 C)   Resp 18   Wt (!) 156.7 kg   LMP 06/03/2016   SpO2 100%   BMI 49.57 kg/m   Physical Exam  Constitutional: She appears well-developed and well-nourished. No distress.  HENT:  Head: Normocephalic and atraumatic.  Eyes: EOM are normal. Pupils are equal, round, and reactive to light. Right eye exhibits chemosis and discharge. Right eye exhibits no  exudate. Right conjunctiva is injected. Right conjunctiva has no hemorrhage.  Right sided conjunctival injection spares the circumfrence of the iris. No pain with EOM.  Neck: Neck supple.  Cardiovascular: Normal rate and regular rhythm.   No murmur heard. Pulmonary/Chest: Effort normal and breath sounds normal. No respiratory distress.  Abdominal: Soft. There is no tenderness.  Musculoskeletal: She exhibits no edema.  Neurological: She is alert.  Skin: Skin is warm and dry.  Psychiatric: She has a normal mood and affect.  Nursing note and vitals reviewed.    ED Treatments / Results  Labs (all labs ordered are listed, but only abnormal results are displayed) Labs Reviewed  BASIC METABOLIC PANEL  CBC WITH DIFFERENTIAL/PLATELET    EKG  EKG Interpretation None       Radiology No results found.  Procedures Procedures (including critical care time)  Medications Ordered in ED Medications  sodium chloride 0.9 % bolus 1,000 mL (not administered)     Initial Impression / Assessment and Plan / ED Course  I have reviewed the triage vital signs and the nursing notes.  Pertinent labs & imaging results that were available during my care of the patient were reviewed by me and considered in my medical decision making (see chart for details).  Clinical Course   Patient presents for evaluation of right eye infection. On exam she does have signs consistent with preseptal cellulitis. There is chemosis and injection that spares the area around the iris, and I don't see any cell and flare so doubt uveitis. This patient does not have a leukocytosis, no fever, no other symptoms was just systemic illness. CT was performed and did not show orbital cellulitis.   Patient will be treated for preseptal cellulitis with antibiotics, orally and topically. I advises patient to follow up with her eye doctor in a week.  We have discussed the discharge plan, including the plan for outpatient followup,  and strict return precautions, including those that would require calling 911.     Final Clinical Impressions(s) / ED Diagnoses   Final diagnoses:  None    New Prescriptions New Prescriptions   No medications on file     Marcelina MorelMichael Brandey Vandalen, MD 06/15/16 2247    Melene Planan Floyd, DO 06/15/16 2254

## 2016-06-15 NOTE — ED Triage Notes (Signed)
Pt here with redness, swelling and drainage to right eye. Pt also has swelling to face. Pt was seen at [facility] and dx with Pink eye on Tuesday. Pt was seen at her PCP office today and sent here for r/o periorbital cellulitis. Pt reports using antibiotic drops with no relief. Pt barely able to open her eye.

## 2016-06-15 NOTE — ED Notes (Signed)
IV attempted x 1 by [employer] Foods[employer] student

## 2016-06-15 NOTE — ED Notes (Signed)
Dr.Supples at bedside for US IV

## 2016-06-20 MED FILL — Fluorescein Sodium Ophth Strips 1 MG: OPHTHALMIC | Qty: 1 | Status: AC

## 2016-08-14 ENCOUNTER — Emergency Department (HOSPITAL_COMMUNITY)
Admission: EM | Admit: 2016-08-14 | Discharge: 2016-08-14 | Disposition: A | Discharge status: Home / Self Care | Payer: Medicaid Other | Attending: Emergency Medicine | Admitting: Emergency Medicine

## 2016-08-14 ENCOUNTER — Emergency Department (HOSPITAL_COMMUNITY): Payer: Medicaid Other

## 2016-08-14 ENCOUNTER — Encounter (HOSPITAL_COMMUNITY): Payer: Self-pay | Admitting: Emergency Medicine

## 2016-08-14 DIAGNOSIS — Z79899 Other long term (current) drug therapy: Secondary | ICD-10-CM | POA: Insufficient documentation

## 2016-08-14 DIAGNOSIS — J069 Acute upper respiratory infection, unspecified: Secondary | ICD-10-CM | POA: Insufficient documentation

## 2016-08-14 DIAGNOSIS — Z87891 Personal history of nicotine dependence: Secondary | ICD-10-CM | POA: Insufficient documentation

## 2016-08-14 DIAGNOSIS — Z791 Long term (current) use of non-steroidal anti-inflammatories (NSAID): Secondary | ICD-10-CM | POA: Insufficient documentation

## 2016-08-14 LAB — RAPID STREP SCREEN (MED CTR MEBANE ONLY): STREPTOCOCCUS, GROUP A SCREEN (DIRECT): NEGATIVE

## 2016-08-14 LAB — I-STAT TROPONIN, ED: Troponin i, poc: 0 ng/mL (ref 0.00–0.08)

## 2016-08-14 IMAGING — DX DG CHEST 2V
2 series · 2 of 2 positions shown · non-contrast
Comparison: [DATE]

CLINICAL DATA: Cough

EXAM:
CHEST  2 VIEW

[chest pa]
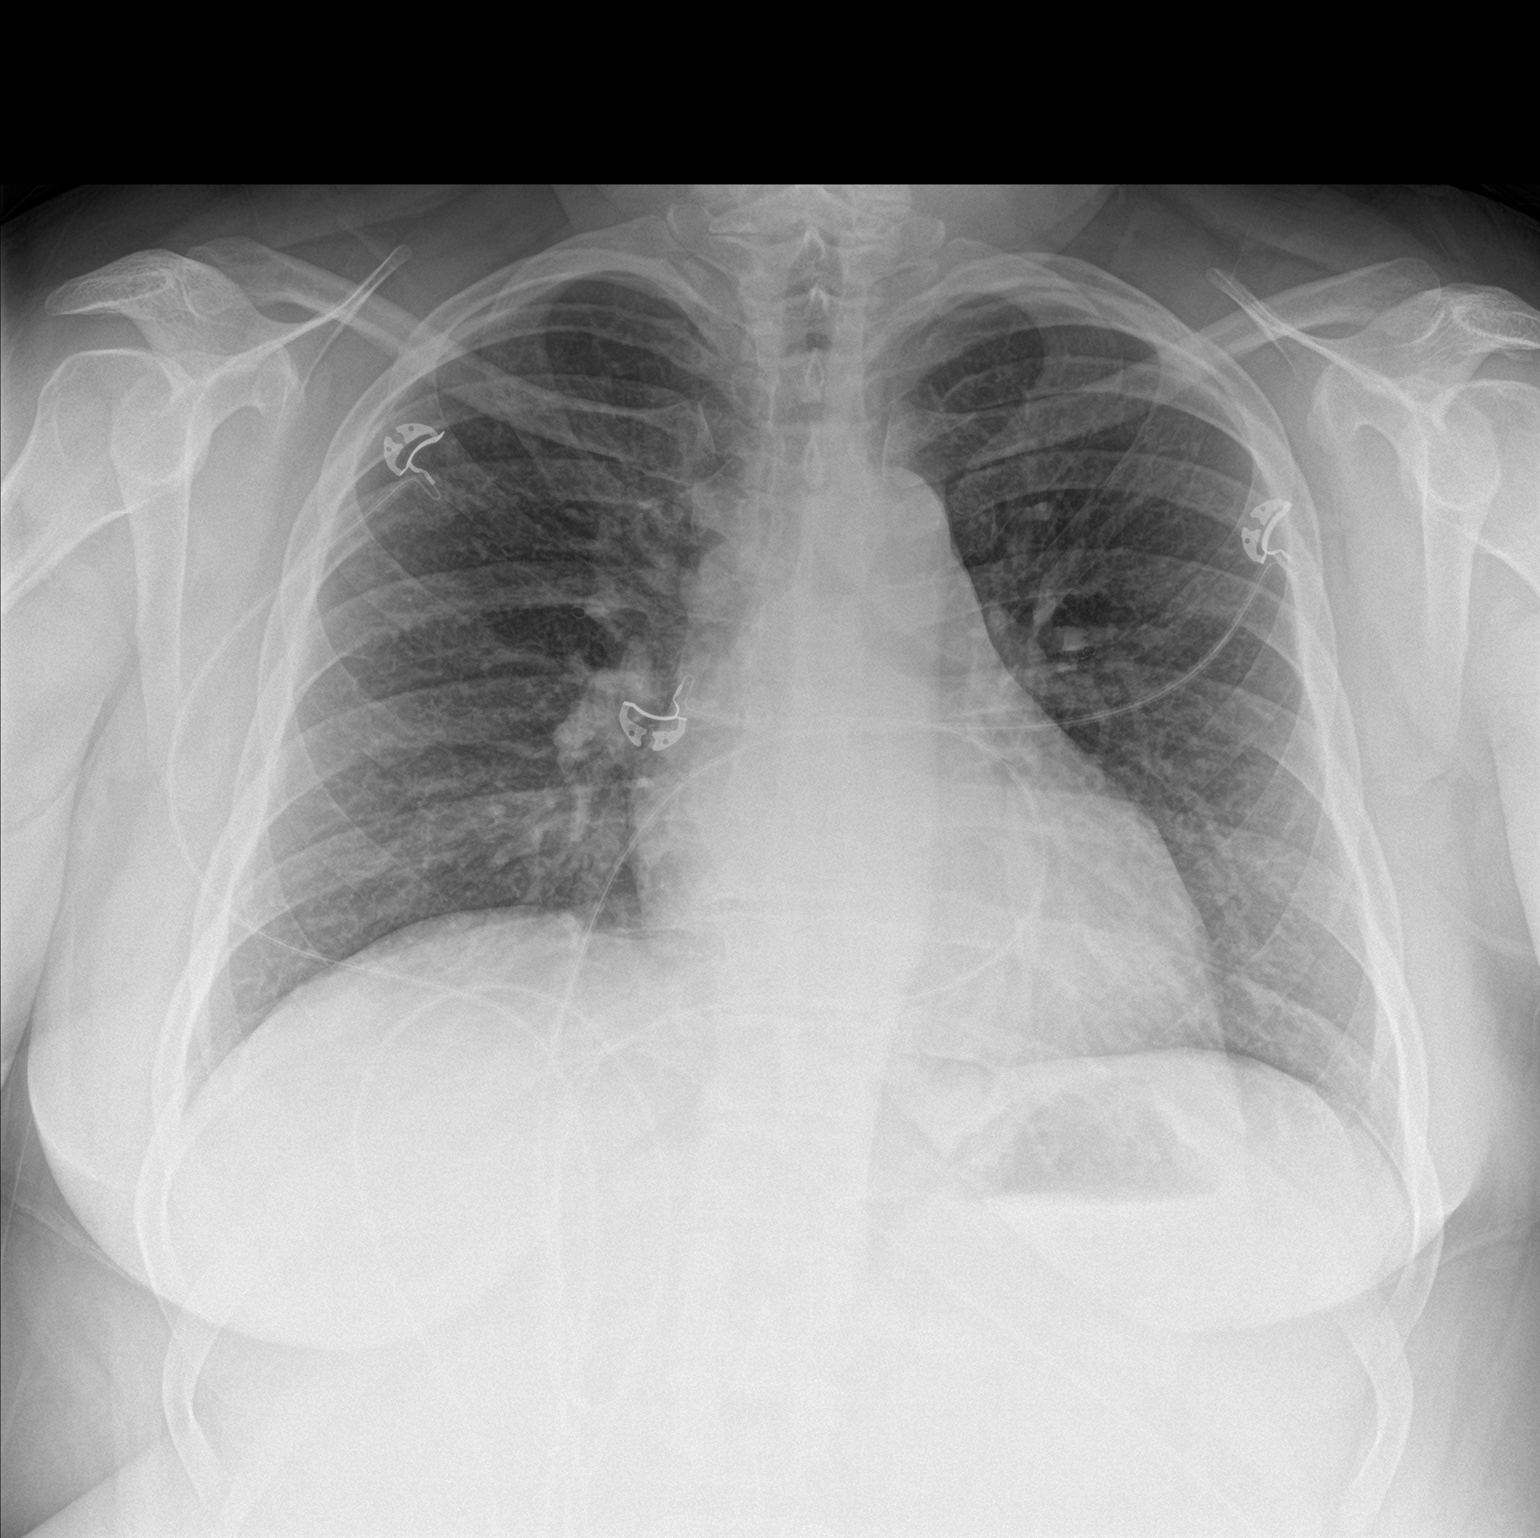

[chest lat]
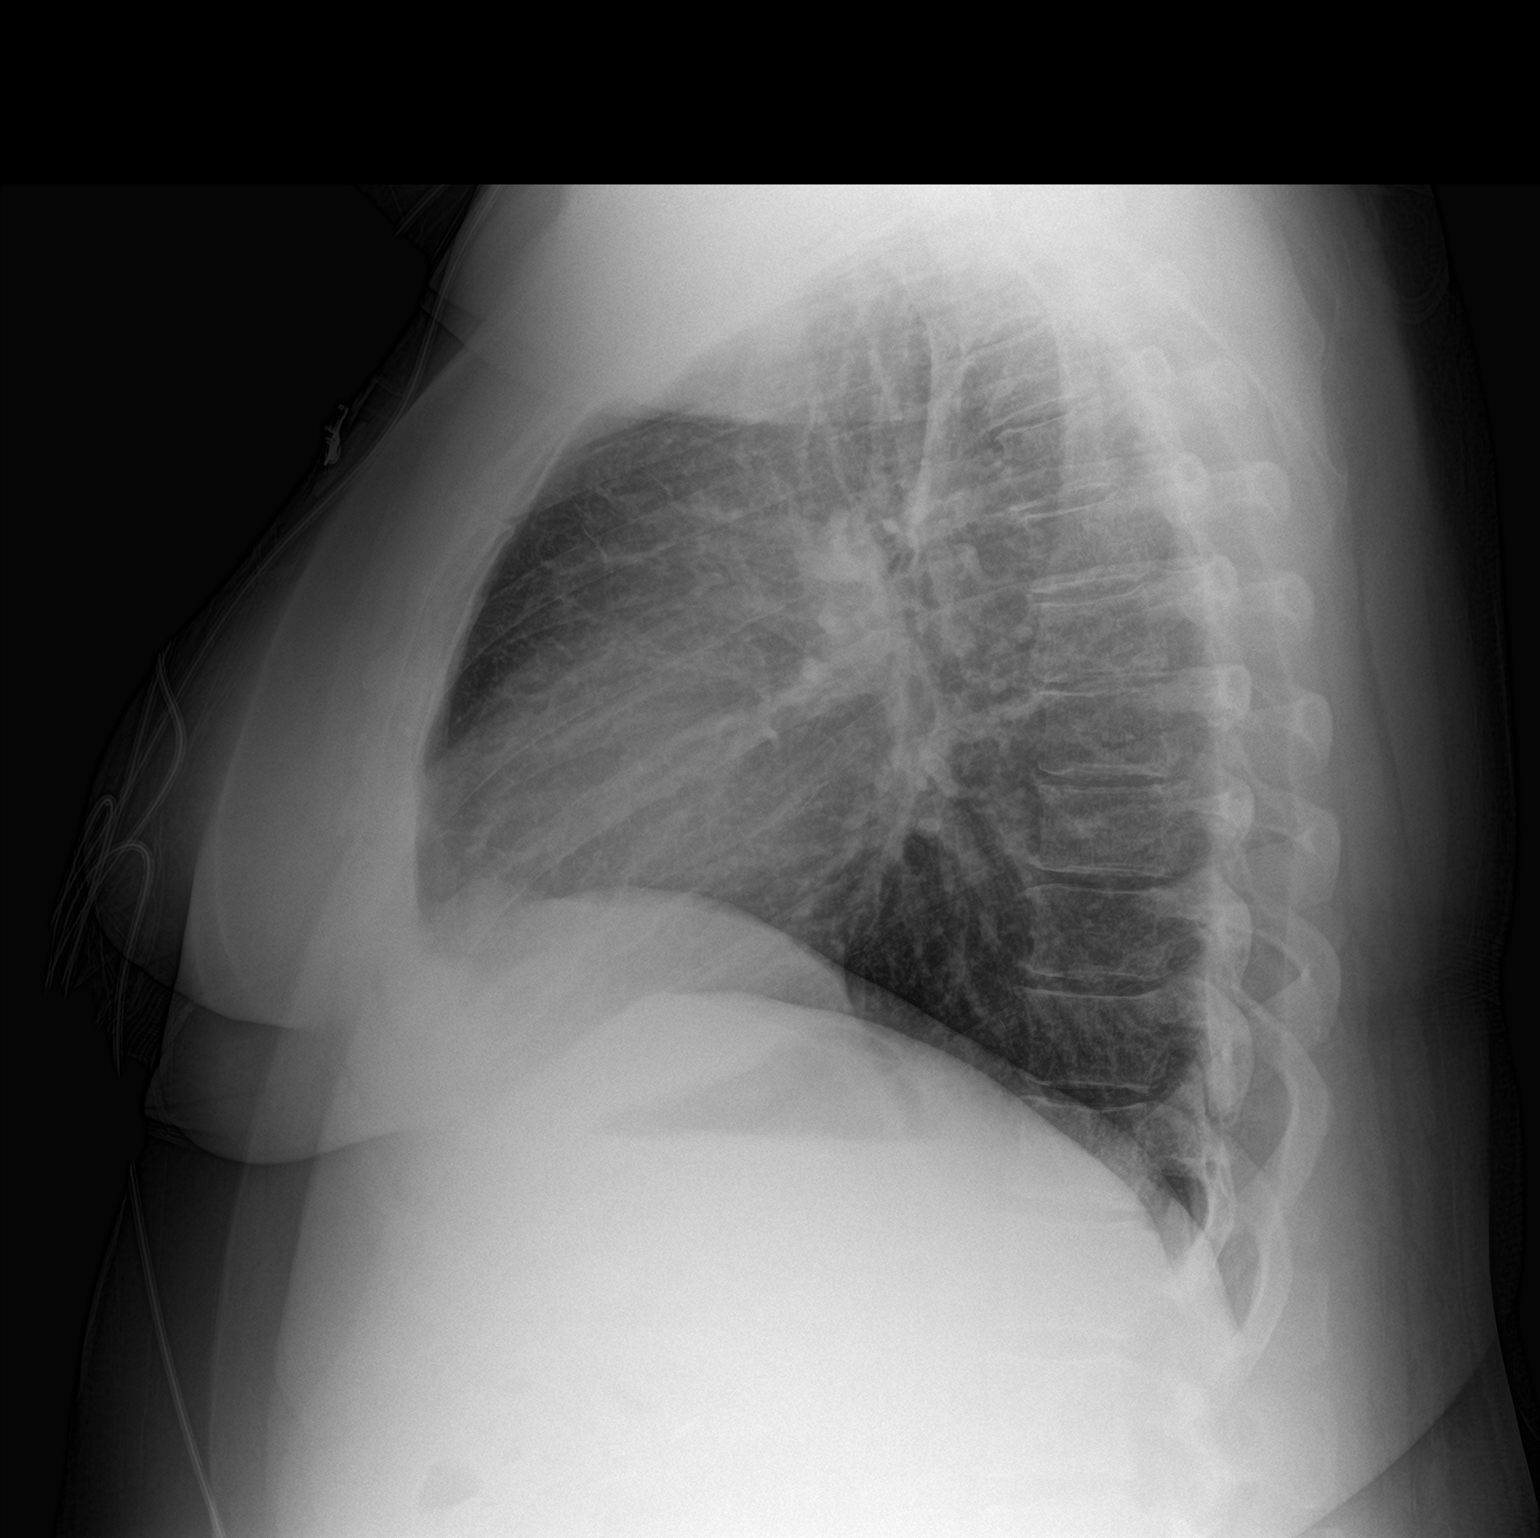

[2 of 2 positions shown; findings below may reference images not displayed]

FINDINGS: The heart size and mediastinal contours are within normal limits.
Both lungs are clear. The visualized skeletal structures are
unremarkable.
IMPRESSION: No active cardiopulmonary disease.

## 2016-08-14 MED ORDER — ALBUTEROL SULFATE HFA 108 (90 BASE) MCG/ACT IN AERS
4.0000 | INHALATION_SPRAY | RESPIRATORY_TRACT | Status: AC
  Administered 2016-08-14: 4 via RESPIRATORY_TRACT
  Filled 2016-08-14: qty 6.7

## 2016-08-14 MED ORDER — BENZONATATE 100 MG PO CAPS: 100.0000 mg | ORAL_CAPSULE | Freq: Three times a day (TID) | ORAL | 0 refills | Status: DC | PRN

## 2016-08-14 NOTE — Discharge Instructions (Signed)
Take over the counter decongestant (such as sudafed), as directed on packaging, for the next week.  Use over the counter normal saline nasal spray with frequent nose blowing, several times per day for the next 2 weeks.  Take the prescription as directed.  Use your albuterol inhaler (2 to 4 puffs) every 4 hours for the next 7 days, then as needed for cough, wheezing, or shortness of breath.  Take over the counter tylenol and ibuprofen, as directed on packaging, as needed for discomfort.  Gargle with warm water several times per day to help with discomfort.  May also use over the counter sore throat pain medicines such as chloraseptic or sucrets, as directed on packaging, as needed for discomfort.  Call your regular medical doctor today to schedule a follow up appointment in the next 3 days.  Return to the Emergency Department immediately if worsening.

## 2016-08-14 NOTE — ED Provider Notes (Signed)
[facility]EMER[facility] DEPT Provider Note   CSN: [id number] Arrival date & time: 08/14/16  1332     History   Chief Complaint Chief Complaint  Patient presents with  . Cough    HPI Jackquline DenmarkMelissa Barro is a 45 y.o. female.   Cough   Pt was seen at 1440.  Per pt, c/o gradual onset and persistence of constant sore throat, runny/stuffy nose, sinus congestion, chest "tightness" with cough for the past 2-3 days. Multiple others in household with same symptoms.  Denies fevers, no rash, no CP/SOB, no N/V/D, no abd pain.    Past Medical History:  Diagnosis Date  . Anxiety   . Depression   . Hot flashes 11/26/2014  . Mood swings (HCC) 11/26/2014    Patient Active Problem List   Diagnosis Date Noted  . Hot flashes 11/26/2014  . Mood swings (HCC) 11/26/2014    Past Surgical History:  Procedure Laterality Date  . CHOLECYSTECTOMY      OB History    Gravida Para Term Preterm AB Living   2 2       2    SAB TAB Ectopic Multiple Live Births                   Home Medications    Prior to Admission medications   Medication Sig Start Date End Date Taking? Authorizing Provider  acetaminophen (TYLENOL) 500 MG tablet Take 1,000 mg by mouth every 6 (six) hours as needed for mild pain or headache.    Yes Historical Provider, MD  ALPRAZolam Prudy Feeler(XANAX) 1 MG tablet Take 0.5-1 mg by mouth 2 (two) times daily.    Yes Historical Provider, MD  escitalopram (LEXAPRO) 20 MG tablet Take 20 mg by mouth daily.   Yes Historical Provider, MD  ibuprofen (ADVIL,MOTRIN) 200 MG tablet Take 600 mg by mouth 2 (two) times daily as needed for cramping.    Yes Historical Provider, MD  ranitidine (ZANTAC) 150 MG tablet Take 150 mg by mouth daily as needed for heartburn.    Yes Historical Provider, MD    Family History Family History  Problem Relation Age of Onset  . Anxiety disorder Mother   . Stroke Father   . Hypertension Father   . Diabetes Father   . Other Father     has a pacemaker  . Anxiety disorder Sister    . Depression Sister   . Hyperlipidemia Daughter   . Cancer Maternal Grandmother   . Diabetes Maternal Grandmother   . Stroke Maternal Grandmother   . Heart disease Maternal Grandmother   . Kidney disease Maternal Grandmother   . Cancer Maternal Grandfather   . Emphysema Paternal Grandmother   . Stroke Paternal Grandmother   . Heart disease Paternal Grandmother   . Diabetes Paternal Grandmother   . Other Paternal Grandfather     "black lung"    Social History Social History  Substance Use Topics  . Smoking status: Former Smoker    Types: Cigarettes  . Smokeless tobacco: Never Used  . Alcohol use No     Allergies   Codeine and Keflex [cephalexin]   Review of Systems Review of Systems  Respiratory: Positive for cough.   ROS: Statement: All systems negative except as marked or noted in the HPI; Constitutional: Negative for fever and chills. ; ; Eyes: Negative for eye pain, redness and discharge. ; ; ENMT: Negative for hoarseness, +ears congested, nasal congestion, runny/stuffy nose, sinus pressure and sore throat. ; ; Cardiovascular: Negative for palpitations,  diaphoresis, dyspnea and peripheral edema. ; ; Respiratory: +cough, pleuritic CP. Negative for wheezing and stridor. ; ; Gastrointestinal: Negative for nausea, vomiting, diarrhea, abdominal pain, blood in stool, hematemesis, jaundice and rectal bleeding. . ; ; Genitourinary: Negative for dysuria, flank pain and hematuria. ; ; Musculoskeletal: Negative for back pain and neck pain. Negative for swelling and trauma.; ; Skin: Negative for pruritus, rash, abrasions, blisters, bruising and skin lesion.; ; Neuro: Negative for headache, lightheadedness and neck stiffness. Negative for weakness, altered level of consciousness, altered mental status, extremity weakness, paresthesias, involuntary movement, seizure and syncope.      Physical Exam Updated Vital Signs BP 125/63 (BP Location: Right Arm)   Pulse 91   Temp 98.6 F (37 C)  (Oral)   Resp 18   Ht 5\' 10"  (1.778 m)   Wt (!) 342 lb (155.1 kg)   LMP 07/13/2016 (Exact Date)   SpO2 100%   BMI 49.07 kg/m   Physical Exam 1445:  Physical examination:  Nursing notes reviewed; Vital signs and O2 SAT reviewed;  Constitutional: Well developed, Well nourished, Well hydrated, In no acute distress; Head:  Normocephalic, atraumatic; Eyes: EOMI, PERRL, No scleral icterus; ENMT: TM's clear bilat. +edemetous nasal turbinates bilat with clear rhinorrhea. Mouth and pharynx without lesions. No tonsillar exudates. No intra-oral edema. No submandibular or sublingual edema. No hoarse voice, no drooling, no stridor. No pain with manipulation of larynx. No trismus. Mouth and pharynx normal, Mucous membranes moist; Neck: Supple, Full range of motion, No lymphadenopathy; Cardiovascular: Regular rate and rhythm, No gallop; Respiratory: Breath sounds coarse & equal bilaterally, No wheezes.  Speaking full sentences with ease, Normal respiratory effort/excursion; Chest: Nontender, Movement normal; Abdomen: Soft, Nontender, Nondistended, Normal bowel sounds; Genitourinary: No CVA tenderness; Extremities: Pulses normal, No tenderness, No edema, No calf edema or asymmetry.; Neuro: AA&Ox3, Major CN grossly intact.  Speech clear. No gross focal motor or sensory deficits in extremities.; Skin: Color normal, Warm, Dry.   ED Treatments / Results  Labs (all labs ordered are listed, but only abnormal results are displayed) Labs Reviewed  RAPID STREP SCREEN (NOT AT [facility])    EKG  EKG Interpretation  Date/Time:  Monday August 14 2016 13:50:29 EDT Ventricular Rate:  90 PR Interval:  130 QRS Duration: 88 QT Interval:  362 QTC Calculation: 442 R Axis:   18 Text Interpretation:  Normal sinus rhythm Cannot rule out Anterior infarct , age undetermined When compared with ECG of 09/15/2013 QT has shortened Otherwise no significant change Confirmed by [facility]  MD, Nicholos Johns [phone]) on 08/14/2016 3:02:51 PM        Radiology   Procedures Procedures (including critical care time)  Medications Ordered in ED Medications  albuterol (PROVENTIL HFA;VENTOLIN HFA) 108 (90 Base) MCG/ACT inhaler 4 puff (4 puffs Inhalation Given 08/14/16 1452)     Initial Impression / Assessment and Plan / ED Course  I have reviewed the triage vital signs and the nursing notes.  Pertinent labs & imaging results that were available during my care of the patient were reviewed by me and considered in my medical decision making (see chart for details).  MDM Reviewed: previous chart, nursing note and vitals Reviewed previous: ECG and labs Interpretation: labs, ECG and x-ray   Results for orders placed or performed during the hospital encounter of 08/14/16  Rapid strep screen  Result Value Ref Range   Streptococcus, Group A Screen (Direct) NEGATIVE NEGATIVE  I-stat troponin, ED  Result Value Ref Range   Troponin i, poc 0.00  0.00 - 0.08 ng/mL   Comment 3           Dg Chest 2 View Result Date: 08/14/2016 CLINICAL DATA:  Cough EXAM: CHEST  2 VIEW COMPARISON:  09/15/2013 FINDINGS: The heart size and mediastinal contours are within normal limits. Both lungs are clear. The visualized skeletal structures are unremarkable. IMPRESSION: No active cardiopulmonary disease. Electronically Signed   By: Marlan Palau M.D.   On: 08/14/2016 16:10    1630:  Doubt PE as cause for symptoms with low risk [place].  Doubt ACS as cause for symptoms with normal troponin and unchanged EKG from previous after 2 days of constant atypical symptoms. MDI given with improvement in "chest tightness." Workup reassuring. Pt states she is ready to go home now. Tx symptomatically. Dx and testing d/w pt.  Questions answered.  Verb understanding, agreeable to d/c home with outpt f/u.   Final Clinical Impressions(s) / ED Diagnoses   Final diagnoses:  None    New Prescriptions New Prescriptions   No medications on file      Samuel Jester,  DO 08/16/16 2104

## 2016-08-14 NOTE — ED Triage Notes (Signed)
Pt reports cough x2 days with yellow sputum, left ear pain, tightness in chest, and congestion.  Pt alert and oriented at this time.  Airway patent.

## 2016-08-14 NOTE — ED Notes (Signed)
EKg given to Dr. Radford PaxBeaton.

## 2016-08-17 LAB — CULTURE, GROUP A STREP (THRC)

## 2017-05-03 ENCOUNTER — Encounter (HOSPITAL_COMMUNITY): Payer: Self-pay | Admitting: *Deleted

## 2017-05-03 ENCOUNTER — Emergency Department (HOSPITAL_COMMUNITY)
Admission: EM | Admit: 2017-05-03 | Discharge: 2017-05-03 | Disposition: A | Discharge status: Home / Self Care | Payer: BLUE CROSS/BLUE SHIELD | Attending: Emergency Medicine | Admitting: Emergency Medicine

## 2017-05-03 ENCOUNTER — Emergency Department (HOSPITAL_COMMUNITY): Payer: BLUE CROSS/BLUE SHIELD

## 2017-05-03 DIAGNOSIS — R0789 Other chest pain: Secondary | ICD-10-CM | POA: Diagnosis not present

## 2017-05-03 DIAGNOSIS — R300 Dysuria: Secondary | ICD-10-CM

## 2017-05-03 DIAGNOSIS — Z87891 Personal history of nicotine dependence: Secondary | ICD-10-CM | POA: Insufficient documentation

## 2017-05-03 DIAGNOSIS — Z79899 Other long term (current) drug therapy: Secondary | ICD-10-CM | POA: Insufficient documentation

## 2017-05-03 DIAGNOSIS — R079 Chest pain, unspecified: Secondary | ICD-10-CM | POA: Diagnosis present

## 2017-05-03 LAB — URINALYSIS, ROUTINE W REFLEX MICROSCOPIC
Bilirubin Urine: NEGATIVE
GLUCOSE, UA: NEGATIVE mg/dL
HGB URINE DIPSTICK: NEGATIVE
KETONES UR: NEGATIVE mg/dL
LEUKOCYTES UA: NEGATIVE
Nitrite: NEGATIVE
PROTEIN: NEGATIVE mg/dL
Specific Gravity, Urine: >1.03 — ABNORMAL HIGH (ref 1.005–1.030)
pH: 5.5 (ref 5.0–8.0)

## 2017-05-03 LAB — COMPREHENSIVE METABOLIC PANEL
ALT: 10 U/L — ABNORMAL LOW (ref 14–54)
ANION GAP: 5 (ref 5–15)
AST: 15 U/L (ref 15–41)
Albumin: 4.1 g/dL (ref 3.5–5.0)
Alkaline Phosphatase: 59 U/L (ref 38–126)
BILIRUBIN TOTAL: 0.7 mg/dL (ref 0.3–1.2)
BUN: 11 mg/dL (ref 6–20)
CHLORIDE: 104 mmol/L (ref 101–111)
CO2: 29 mmol/L (ref 22–32)
Calcium: 8.8 mg/dL — ABNORMAL LOW (ref 8.9–10.3)
Creatinine, Ser: 0.77 mg/dL (ref 0.44–1.00)
Glucose, Bld: 102 mg/dL — ABNORMAL HIGH (ref 65–99)
POTASSIUM: 3.9 mmol/L (ref 3.5–5.1)
Sodium: 138 mmol/L (ref 135–145)
TOTAL PROTEIN: 7 g/dL (ref 6.5–8.1)

## 2017-05-03 LAB — I-STAT BETA HCG BLOOD, ED (MC, WL, AP ONLY)

## 2017-05-03 LAB — CBC
HEMATOCRIT: 40.3 % (ref 36.0–46.0)
Hemoglobin: 13.3 g/dL (ref 12.0–15.0)
MCH: 28.1 pg (ref 26.0–34.0)
MCHC: 33 g/dL (ref 30.0–36.0)
MCV: 85.2 fL (ref 78.0–100.0)
Platelets: 194 10*3/uL (ref 150–400)
RBC: 4.73 MIL/uL (ref 3.87–5.11)
RDW: 14.2 % (ref 11.5–15.5)
WBC: 8.6 10*3/uL (ref 4.0–10.5)

## 2017-05-03 LAB — WET PREP, GENITAL
Clue Cells Wet Prep HPF POC: NONE SEEN
Sperm: NONE SEEN
TRICH WET PREP: NONE SEEN
WBC, Wet Prep HPF POC: NONE SEEN
Yeast Wet Prep HPF POC: NONE SEEN

## 2017-05-03 LAB — TROPONIN I

## 2017-05-03 LAB — LIPASE, BLOOD: Lipase: 24 U/L (ref 11–51)

## 2017-05-03 LAB — D-DIMER, QUANTITATIVE (NOT AT ARMC): D DIMER QUANT: 0.37 ug{FEU}/mL (ref 0.00–0.50)

## 2017-05-03 LAB — CBG MONITORING, ED: GLUCOSE-CAPILLARY: 96 mg/dL (ref 65–99)

## 2017-05-03 IMAGING — DX DG CHEST 2V
2 series · 2 of 2 positions shown · non-contrast
Comparison: [DATE]

CLINICAL DATA: Mid chest pain, shortness of Breath

EXAM:
CHEST  2 VIEW

[chest pa]
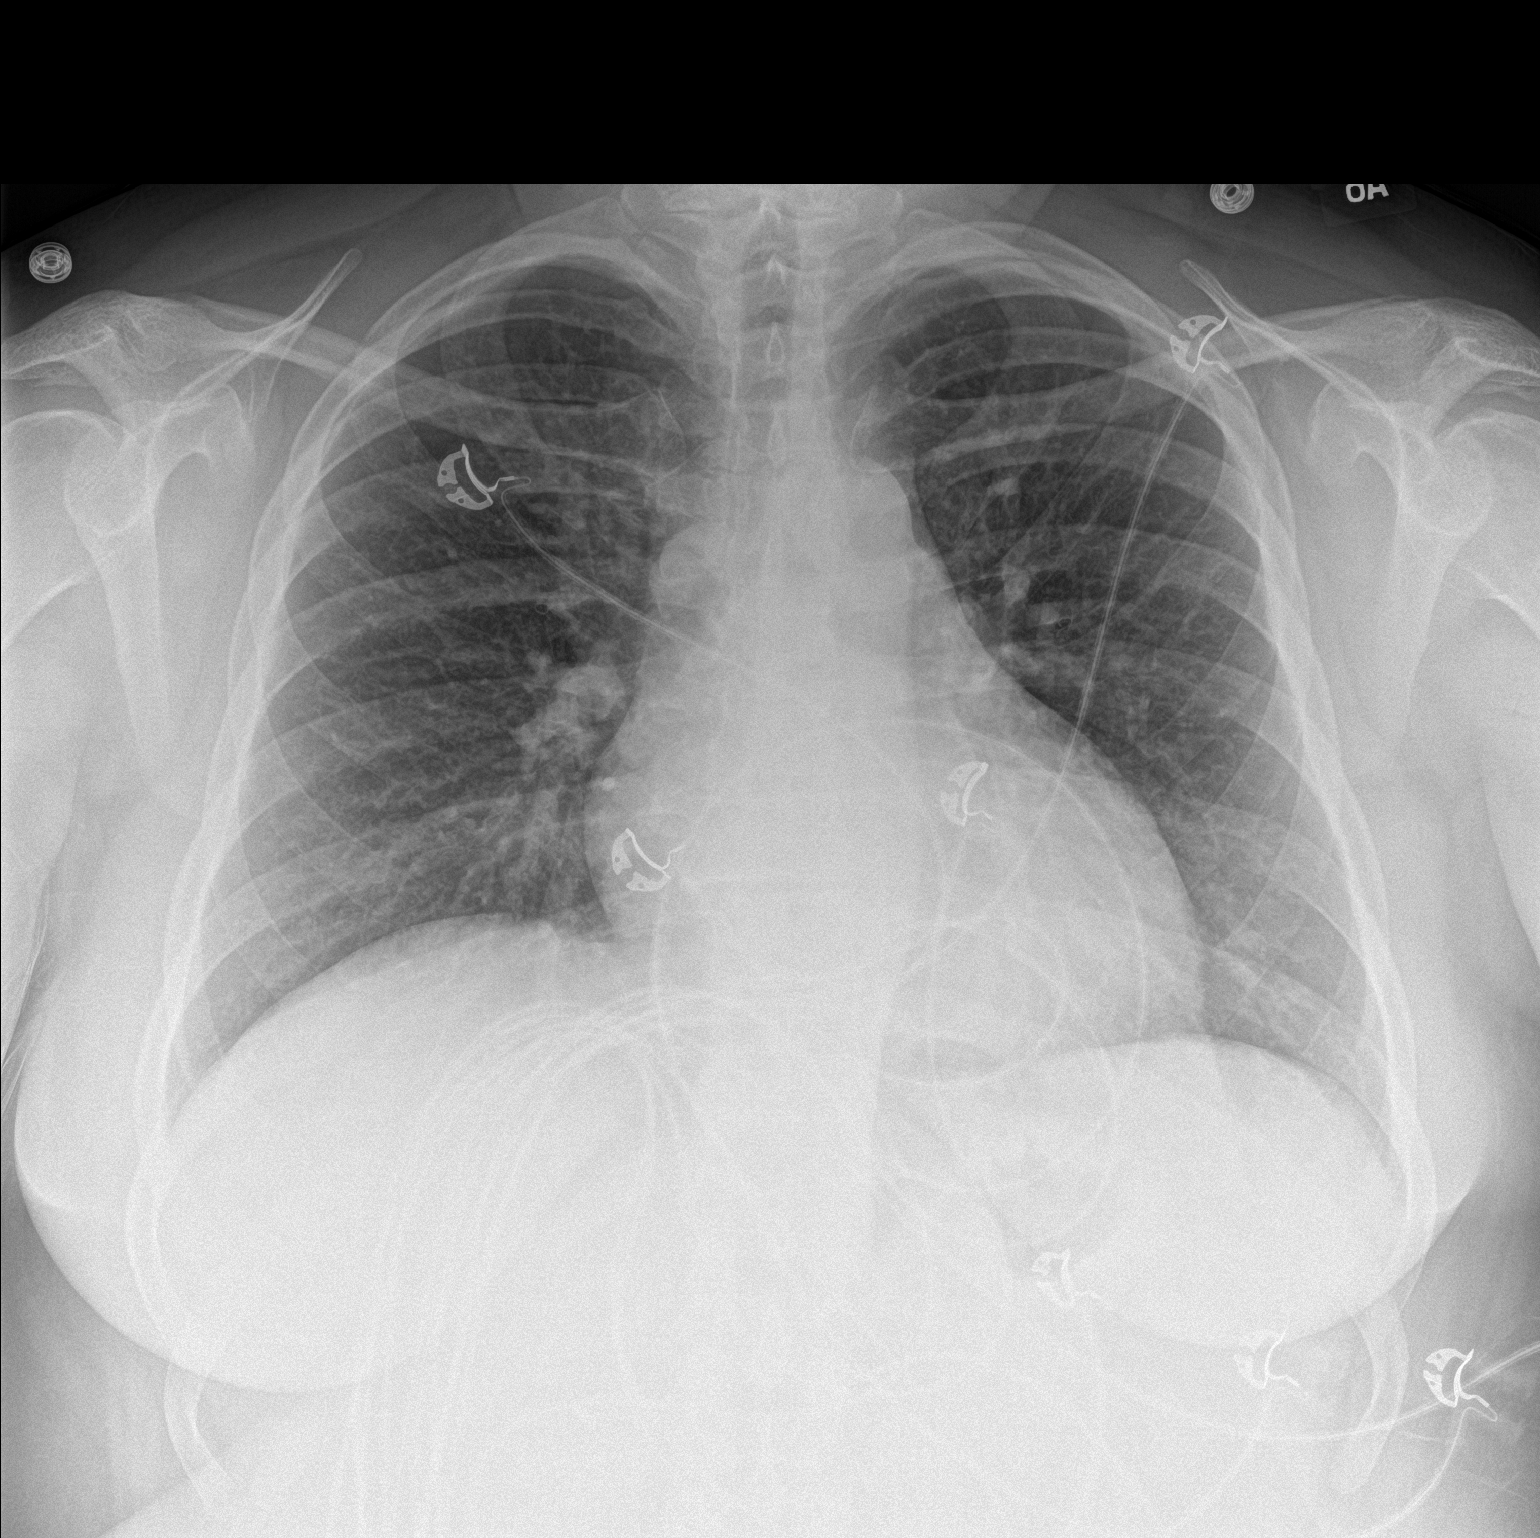

[chest lat]
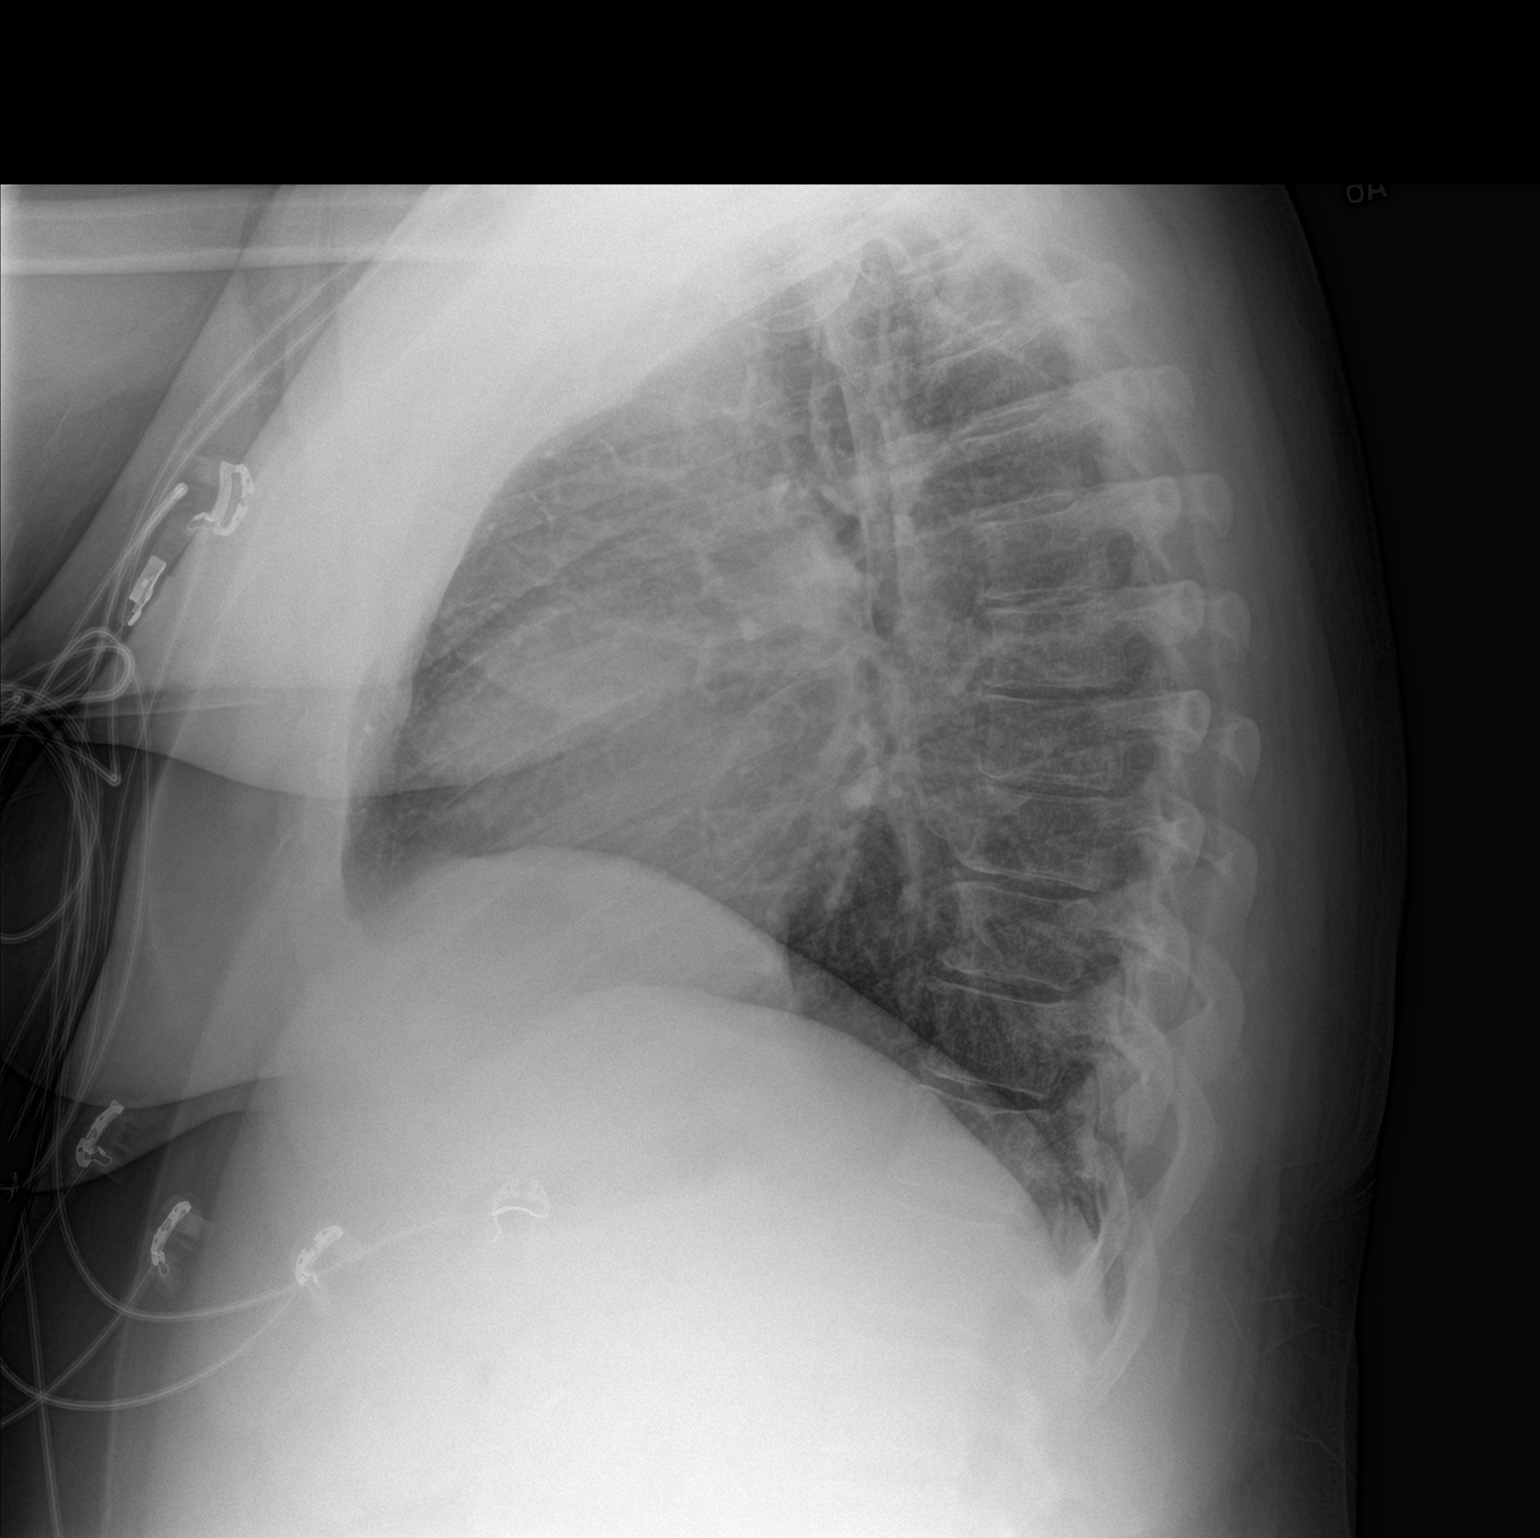

[2 of 2 positions shown; findings below may reference images not displayed]

FINDINGS: Heart size is borderline. Mild peribronchial thickening. No
confluent opacities or effusions. No acute bony abnormality.
IMPRESSION: Borderline heart size.  Mild bronchitic changes.

## 2017-05-03 MED ORDER — KETOROLAC TROMETHAMINE 10 MG PO TABS: 10.0000 mg | ORAL_TABLET | Freq: Four times a day (QID) | ORAL | 0 refills | Status: DC | PRN

## 2017-05-03 MED ORDER — SODIUM CHLORIDE 0.9 % IV BOLUS (SEPSIS)
1000.0000 mL | Freq: Once | INTRAVENOUS | Status: AC
  Administered 2017-05-03: 1000 mL via INTRAVENOUS

## 2017-05-03 MED ORDER — SODIUM CHLORIDE 0.9 % IV SOLN: INTRAVENOUS | Status: DC

## 2017-05-03 NOTE — ED Provider Notes (Signed)
[facility] DEPT Provider Note   CSN: [id number] Arrival date & time: 05/03/17  0954     History   Chief Complaint Chief Complaint  Patient presents with  . Chest Pain  . Dysuria    HPI Alexandra Shaffer is a 46 y.o. female.  Pt presents to the ED today with sob and cp.  The pt also reports dysuria and vaginal d/c.  The pt said sx have been going on for a few days.  The pt denies f/c or n/v.       Past Medical History:  Diagnosis Date  . Anxiety   . Depression   . Hot flashes 11/26/2014  . Mood swings (HCC) 11/26/2014    Patient Active Problem List   Diagnosis Date Noted  . Hot flashes 11/26/2014  . Mood swings (HCC) 11/26/2014    Past Surgical History:  Procedure Laterality Date  . CHOLECYSTECTOMY      OB History    Gravida Para Term Preterm AB Living   2 2       2    SAB TAB Ectopic Multiple Live Births                   Home Medications    Prior to Admission medications   Medication Sig Start Date End Date Taking? Authorizing Provider  acetaminophen (TYLENOL) 500 MG tablet Take 1,000 mg by mouth every 6 (six) hours as needed for mild pain or headache.    Yes [provider]  ALPRAZolam Prudy Feeler(XANAX) 1 MG tablet Take 0.5-1 mg by mouth 2 (two) times daily as needed for anxiety.    Yes [provider]  escitalopram (LEXAPRO) 20 MG tablet Take 20 mg by mouth daily.   Yes [provider]  ibuprofen (ADVIL,MOTRIN) 200 MG tablet Take 600 mg by mouth 2 (two) times daily as needed for cramping.    Yes [provider]  ranitidine (ZANTAC) 150 MG tablet Take 150 mg by mouth daily as needed for heartburn.    Yes [provider]  benzonatate (TESSALON) 100 MG capsule Take 1 capsule (100 mg total) by mouth 3 (three) times daily as needed for cough. Patient not taking: Reported on 05/03/2017 08/14/16   Samuel JesterMcManus, Kathleen, DO  ketorolac (TORADOL) 10 MG tablet Take 1 tablet (10 mg total) by mouth every 6 (six) hours as needed. 05/03/17    Jacalyn LefevreHaviland, Ryan Ogborn, MD    Family History Family History  Problem Relation Age of Onset  . Anxiety disorder Mother   . Stroke Father   . Hypertension Father   . Diabetes Father   . Other Father        has a pacemaker  . Anxiety disorder Sister   . Depression Sister   . Hyperlipidemia Daughter   . Cancer Maternal Grandmother   . Diabetes Maternal Grandmother   . Stroke Maternal Grandmother   . Heart disease Maternal Grandmother   . Kidney disease Maternal Grandmother   . Cancer Maternal Grandfather   . Emphysema Paternal Grandmother   . Stroke Paternal Grandmother   . Heart disease Paternal Grandmother   . Diabetes Paternal Grandmother   . Other Paternal Grandfather        "black lung"    Social History Social History  Substance Use Topics  . Smoking status: Former Smoker    Types: Cigarettes  . Smokeless tobacco: Never Used  . Alcohol use No     Allergies   Codeine and Keflex [cephalexin]   Review of  Systems Review of Systems  Respiratory: Positive for shortness of breath.   Cardiovascular: Positive for chest pain.  Genitourinary: Positive for dysuria and vaginal discharge.  All other systems reviewed and are negative.    Physical Exam Updated Vital Signs BP 114/69   Pulse 64   Temp 97.7 F (36.5 C) (Oral)   Resp 17   Ht 5\' 9"  (1.753 m)   Wt (!) 151 kg (333 lb)   LMP 04/26/2017   SpO2 100%   BMI 49.18 kg/m   Physical Exam  Constitutional: She is oriented to person, place, and time. She appears well-developed and well-nourished.  HENT:  Head: Normocephalic and atraumatic.  Right Ear: External ear normal.  Left Ear: External ear normal.  Nose: Nose normal.  Mouth/Throat: Oropharynx is clear and moist.  Eyes: Pupils are equal, round, and reactive to light. Conjunctivae and EOM are normal.  Neck: Normal range of motion. Neck supple.  Cardiovascular: Normal rate, regular rhythm, normal heart sounds and intact distal pulses.   Pulmonary/Chest:  Effort normal and breath sounds normal.  Abdominal: Soft. Bowel sounds are normal.  Genitourinary: Uterus normal. Cervix exhibits discharge. Right adnexum displays no tenderness. Left adnexum displays no tenderness. Vaginal discharge found.  Musculoskeletal: Normal range of motion.  Neurological: She is alert and oriented to person, place, and time.  Skin: Skin is warm.  Psychiatric: She has a normal mood and affect. Her behavior is normal. Judgment and thought content normal.  Nursing note and vitals reviewed.    ED Treatments / Results  Labs (all labs ordered are listed, but only abnormal results are displayed) Labs Reviewed  COMPREHENSIVE METABOLIC PANEL - Abnormal; Notable for the following:       Result Value   Glucose, Bld 102 (*)    Calcium 8.8 (*)    ALT 10 (*)    All other components within normal limits  URINALYSIS, ROUTINE W REFLEX MICROSCOPIC - Abnormal; Notable for the following:    APPearance CLOUDY (*)    Specific Gravity, Urine >1.030 (*)    All other components within normal limits  WET PREP, GENITAL  LIPASE, BLOOD  TROPONIN I  D-DIMER, QUANTITATIVE (NOT AT [facility])  CBC  CBG MONITORING, ED  I-STAT BETA HCG BLOOD, ED ([facility], [facility], [facility] ONLY)  GC/CHLAMYDIA PROBE AMP ([place]) NOT AT [facility]    EKG  EKG Interpretation  Date/Time:  Thursday May 03 2017 10:02:31 EDT Ventricular Rate:  70 PR Interval:    QRS Duration: 98 QT Interval:  406 QTC Calculation: 439 R Axis:   12 Text Interpretation:  Sinus rhythm Confirmed by Jacalyn Lefevre [phone]) on 05/03/2017 10:46:00 AM       Radiology Dg Chest 2 View  Result Date: 05/03/2017 CLINICAL DATA:  Mid chest pain, shortness of Breath EXAM: CHEST  2 VIEW COMPARISON:  08/14/2016 FINDINGS: Heart size is borderline. Mild peribronchial thickening. No confluent opacities or effusions. No acute bony abnormality. IMPRESSION: Borderline heart size.  Mild bronchitic changes. Electronically Signed   By: Charlett Nose M.D.   On:  05/03/2017 11:05    Procedures Procedures (including critical care time)  Medications Ordered in ED Medications  sodium chloride 0.9 % bolus 1,000 mL (1,000 mLs Intravenous New Bag/Given 05/03/17 1040)    And  0.9 %  sodium chloride infusion (not administered)     Initial Impression / Assessment and Plan / ED Course  I have reviewed the triage vital signs and the nursing notes.  Pertinent labs & imaging results  that were available during my care of the patient were reviewed by me and considered in my medical decision making (see chart for details).     Pt is at low risk for CAD.  I think CP is likely MS in nature.  Etiology of dysuria is also undetermined.  Wet prep and UA ok.  GC and Chl pending.    Pt knows to return if worse and f/u with pcp.  Final Clinical Impressions(s) / ED Diagnoses   Final diagnoses:  Atypical chest pain  Dysuria    New Prescriptions New Prescriptions   KETOROLAC (TORADOL) 10 MG TABLET    Take 1 tablet (10 mg total) by mouth every 6 (six) hours as needed.     Jacalyn Lefevre, MD 05/03/17 1225

## 2017-05-03 NOTE — ED Triage Notes (Addendum)
Pt c/o mid chest pain that radiates to epigastric area along with SOB. Denies nausea, dizziness. Pt reports she has anxiety and doesn't know if this is related to some stress going on at work recently. Pt also reports dysuria and urinary frequency, denies hematuria, x couple of days.

## 2017-05-04 LAB — GC/CHLAMYDIA PROBE AMP (~~LOC~~) NOT AT ARMC
CHLAMYDIA, DNA PROBE: NEGATIVE
NEISSERIA GONORRHEA: NEGATIVE

## 2017-08-21 ENCOUNTER — Ambulatory Visit (INDEPENDENT_AMBULATORY_CARE_PROVIDER_SITE_OTHER): Payer: 59 | Admitting: Adult Health

## 2017-08-21 ENCOUNTER — Encounter: Payer: Self-pay | Admitting: Adult Health

## 2017-08-21 VITALS — BP 118/74 | HR 84 | Ht 70.0 in | Wt 342.0 lb

## 2017-08-21 DIAGNOSIS — R102 Pelvic and perineal pain: Secondary | ICD-10-CM

## 2017-08-21 DIAGNOSIS — N3946 Mixed incontinence: Secondary | ICD-10-CM | POA: Diagnosis not present

## 2017-08-21 DIAGNOSIS — R300 Dysuria: Secondary | ICD-10-CM

## 2017-08-21 DIAGNOSIS — N920 Excessive and frequent menstruation with regular cycle: Secondary | ICD-10-CM | POA: Diagnosis not present

## 2017-08-21 LAB — POCT URINALYSIS DIPSTICK
Glucose, UA: NEGATIVE
KETONES UA: NEGATIVE
Leukocytes, UA: NEGATIVE
Nitrite, UA: NEGATIVE
Protein, UA: NEGATIVE
RBC UA: NEGATIVE

## 2017-08-21 MED ORDER — MIRABEGRON ER 25 MG PO TB24: 25.0000 mg | ORAL_TABLET | Freq: Every day | ORAL | 0 refills | Status: DC

## 2017-08-21 NOTE — Progress Notes (Signed)
Subjective:     Patient ID: Alexandra DenmarkMelissa Shaffer, female   DOB: [birth year], 46 y.o.   MRN: [id number]  HPI Alexandra Shaffer is a 46 year old white female, married in complaining of burning with urination and urinary incontinence, when coughs or sneezes or has to go and just any time.She says periods are heavy and sometimes painful and sex hurts and pelvic area hurts.   Review of Systems Burning with urination  +Urinary incontinence  +Pelvic pain +heavy periods +pain with sex Reviewed past medical,surgical, social and family history. Reviewed medications and allergies.     Objective:   Physical Exam BP 118/74 (BP Location: Right Arm, Patient Position: Sitting, Cuff Size: Large)   Pulse 84   Ht 5\' 10"  (1.778 m)   Wt (!) 342 lb (155.1 kg)   LMP 06/26/2017 (Approximate)   BMI 49.07 kg/m urine dipstick. Skin warm and dry.Pelvic: external genitalia is normal in appearance no lesions, vagina: pink with good moisture,urethra has no lesions or masses noted, cervix:smooth and bulbous, uterus: normal size, shape and contour, +tender, no masses felt, adnexa: no masses or tenderness noted. Bladder is non tender and no masses felt.    Will get US and try myrbetriq.She is past due for pap too, will get that scheduled.   Assessment:     1. Pelvic pain   2. Urinary incontinence, mixed   3. Menorrhagia with regular cycle   4. Burning with urination       Plan:     Meds ordered this encounter  Medications  . mirabegron ER (MYRBETRIQ) 25 MG TB24 tablet    Sig: Take 1 tablet (25 mg total) by mouth daily.    Dispense:  28 tablet    Refill:  0    Order Specific Question:   Supervising Provider    Answer:   Duane LopeEURE, LUTHER H [[id number]]  Return in 1 week for GYN US, then see me 2-3 days later for pap and physical

## 2017-08-28 ENCOUNTER — Other Ambulatory Visit: Payer: 59

## 2017-08-30 ENCOUNTER — Other Ambulatory Visit: Payer: 59 | Admitting: Adult Health

## 2018-03-22 ENCOUNTER — Ambulatory Visit: Payer: 59 | Admitting: Obstetrics & Gynecology

## 2018-07-03 ENCOUNTER — Encounter (HOSPITAL_COMMUNITY): Payer: Self-pay | Admitting: Emergency Medicine

## 2018-07-03 ENCOUNTER — Emergency Department (HOSPITAL_COMMUNITY)
Admission: EM | Admit: 2018-07-03 | Discharge: 2018-07-03 | Disposition: A | Discharge status: Home / Self Care | Payer: BLUE CROSS/BLUE SHIELD | Attending: Emergency Medicine | Admitting: Emergency Medicine

## 2018-07-03 ENCOUNTER — Other Ambulatory Visit: Payer: Self-pay

## 2018-07-03 DIAGNOSIS — J069 Acute upper respiratory infection, unspecified: Secondary | ICD-10-CM | POA: Insufficient documentation

## 2018-07-03 DIAGNOSIS — Z79899 Other long term (current) drug therapy: Secondary | ICD-10-CM | POA: Insufficient documentation

## 2018-07-03 DIAGNOSIS — Z87891 Personal history of nicotine dependence: Secondary | ICD-10-CM | POA: Insufficient documentation

## 2018-07-03 DIAGNOSIS — J029 Acute pharyngitis, unspecified: Secondary | ICD-10-CM

## 2018-07-03 LAB — URINALYSIS, ROUTINE W REFLEX MICROSCOPIC
BACTERIA UA: NONE SEEN
BILIRUBIN URINE: NEGATIVE
Glucose, UA: NEGATIVE mg/dL
KETONES UR: NEGATIVE mg/dL
Leukocytes, UA: NEGATIVE
Nitrite: NEGATIVE
Protein, ur: NEGATIVE mg/dL
Specific Gravity, Urine: 1.02 (ref 1.005–1.030)
pH: 5 (ref 5.0–8.0)

## 2018-07-03 LAB — GROUP A STREP BY PCR: GROUP A STREP BY PCR: NOT DETECTED

## 2018-07-03 MED ORDER — AMOXICILLIN 500 MG PO CAPS: 500.0000 mg | ORAL_CAPSULE | Freq: Three times a day (TID) | ORAL | 0 refills | Status: DC

## 2018-07-03 NOTE — ED Provider Notes (Signed)
[facility] EMERGENCY DEPARTMENT Provider Note   CSN: [id number] Arrival date & time: 07/03/18  1423     History   Chief Complaint Chief Complaint  Patient presents with  . Sore Throat    HPI Alexandra Shaffer is a 47 y.o. female.  The history is provided by the patient. No language interpreter was used.  Sore Throat  This is a new problem. The problem occurs constantly. The problem has not changed since onset.Nothing aggravates the symptoms. Nothing relieves the symptoms. She has tried nothing for the symptoms. The treatment provided no relief.   Pt complains of a sore throat and burning when she urinates.  Past Medical History:  Diagnosis Date  . Anxiety   . Depression   . Hot flashes 11/26/2014  . Mood swings 11/26/2014    Patient Active Problem List   Diagnosis Date Noted  . Hot flashes 11/26/2014  . Mood swings 11/26/2014    Past Surgical History:  Procedure Laterality Date  . CHOLECYSTECTOMY       OB History    Gravida  2   Para  2   Term      Preterm      AB      Living  2     SAB      TAB      Ectopic      Multiple      Live Births               Home Medications    Prior to Admission medications   Medication Sig Start Date End Date Taking? Authorizing Provider  acetaminophen (TYLENOL) 500 MG tablet Take 1,000 mg by mouth every 6 (six) hours as needed for mild pain or headache.    Yes [provider]  ALPRAZolam Prudy Feeler) 1 MG tablet Take 0.5-1 mg by mouth 2 (two) times daily as needed for anxiety.    Yes [provider]  escitalopram (LEXAPRO) 20 MG tablet Take 20 mg by mouth daily.   Yes [provider]  ibuprofen (ADVIL,MOTRIN) 200 MG tablet Take 600 mg by mouth 2 (two) times daily as needed for cramping.    Yes [provider]  mirabegron ER (MYRBETRIQ) 25 MG TB24 tablet Take 1 tablet (25 mg total) by mouth daily. Patient not taking: Reported on 07/03/2018 08/21/17   Adline Potter, NP     Family History Family History  Problem Relation Age of Onset  . Anxiety disorder Mother   . Stroke Father   . Hypertension Father   . Diabetes Father   . Other Father        has a pacemaker  . Anxiety disorder Sister   . Depression Sister   . Hyperlipidemia Daughter   . Cancer Maternal Grandmother   . Diabetes Maternal Grandmother   . Stroke Maternal Grandmother   . Heart disease Maternal Grandmother   . Kidney disease Maternal Grandmother   . Cancer Maternal Grandfather   . Emphysema Paternal Grandmother   . Stroke Paternal Grandmother   . Heart disease Paternal Grandmother   . Diabetes Paternal Grandmother   . Other Paternal Grandfather        "black lung"    Social History Social History   Tobacco Use  . Smoking status: Former Smoker    Types: Cigarettes  . Smokeless tobacco: Never Used  Substance Use Topics  . Alcohol use: No  . Drug use: No     Allergies   Codeine and  Keflex [cephalexin]   Review of Systems Review of Systems  HENT: Positive for sore throat.   Genitourinary: Positive for dysuria.  All other systems reviewed and are negative.    Physical Exam Updated Vital Signs BP 134/77   Pulse 78   Temp 98.2 F (36.8 C) (Oral)   Resp 18   Ht 5\' 10"  (1.778 m)   Wt (!) 152 kg   LMP 06/16/2018   SpO2 97%   BMI 48.07 kg/m   Physical Exam  Constitutional: She appears well-developed and well-nourished.  HENT:  Head: Normocephalic and atraumatic.  Right Ear: Tympanic membrane normal.  Left Ear: Tympanic membrane normal.  Mouth/Throat: Mucous membranes are normal. Posterior oropharyngeal erythema present.  Eyes: Pupils are equal, round, and reactive to light.  Neck: Normal range of motion.  Cardiovascular: Normal rate.  Pulmonary/Chest: Effort normal.  Abdominal: Soft.  Neurological: She is alert.  Skin: Skin is warm.  Psychiatric: She has a normal mood and affect.  Nursing note and vitals reviewed.    ED Treatments / Results   Labs (all labs ordered are listed, but only abnormal results are displayed) Labs Reviewed  URINALYSIS, ROUTINE W REFLEX MICROSCOPIC - Abnormal; Notable for the following components:      Result Value   APPearance HAZY (*)    Hgb urine dipstick MODERATE (*)    All other components within normal limits  GROUP A STREP BY PCR    EKG None  Radiology No results found.  Procedures Procedures (including critical care time)  Medications Ordered in ED Medications - No data to display   Initial Impression / Assessment and Plan / ED Course  I have reviewed the triage vital signs and the nursing notes.  Pertinent labs & imaging results that were available during my care of the patient were reviewed by me and considered in my medical decision making (see chart for details).     MDM   Pt counseled on symptoms.  Pt advised to follow up with her MD for recheck in 2-3 days  Final Clinical Impressions(s) / ED Diagnoses   Final diagnoses:  Pharyngitis, unspecified etiology  Upper respiratory tract infection, unspecified type    ED Discharge Orders         Ordered    amoxicillin (AMOXIL) 500 MG capsule  3 times daily     07/03/18 1044        An After Visit Summary was printed and given to the patient.    Elson Areas, PA-C 07/03/18 1045    Samuel Jester, DO 07/07/18 1332

## 2018-07-03 NOTE — ED Triage Notes (Addendum)
Pt reports sore throat, neck swelling, and right eye drainage since Sunday. Pt reports taking otc tylenol and reports "woke up in a sweat" on Sunday. nad noted. Airway patent.   Pt also reports dysuria and would like "urine tested".

## 2018-07-03 NOTE — Discharge Instructions (Signed)
Return if any problems.

## 2018-10-12 ENCOUNTER — Encounter (HOSPITAL_COMMUNITY): Payer: Self-pay | Admitting: Emergency Medicine

## 2018-10-12 ENCOUNTER — Emergency Department (HOSPITAL_COMMUNITY): Payer: Self-pay

## 2018-10-12 ENCOUNTER — Emergency Department (HOSPITAL_COMMUNITY)
Admission: EM | Admit: 2018-10-12 | Discharge: 2018-10-12 | Disposition: A | Discharge status: Home / Self Care | Payer: Self-pay | Attending: Emergency Medicine | Admitting: Emergency Medicine

## 2018-10-12 ENCOUNTER — Other Ambulatory Visit: Payer: Self-pay

## 2018-10-12 DIAGNOSIS — Z87891 Personal history of nicotine dependence: Secondary | ICD-10-CM | POA: Insufficient documentation

## 2018-10-12 DIAGNOSIS — F329 Major depressive disorder, single episode, unspecified: Secondary | ICD-10-CM | POA: Insufficient documentation

## 2018-10-12 DIAGNOSIS — F419 Anxiety disorder, unspecified: Secondary | ICD-10-CM | POA: Insufficient documentation

## 2018-10-12 DIAGNOSIS — Z79899 Other long term (current) drug therapy: Secondary | ICD-10-CM | POA: Insufficient documentation

## 2018-10-12 DIAGNOSIS — J209 Acute bronchitis, unspecified: Secondary | ICD-10-CM | POA: Insufficient documentation

## 2018-10-12 DIAGNOSIS — Z9049 Acquired absence of other specified parts of digestive tract: Secondary | ICD-10-CM | POA: Insufficient documentation

## 2018-10-12 IMAGING — DX DG CHEST 2V
2 series · 2 of 2 positions shown · non-contrast
Comparison: [DATE]

CLINICAL DATA: Cough and chest congestion for 3 days.

EXAM:
CHEST - 2 VIEW

[chest pa]
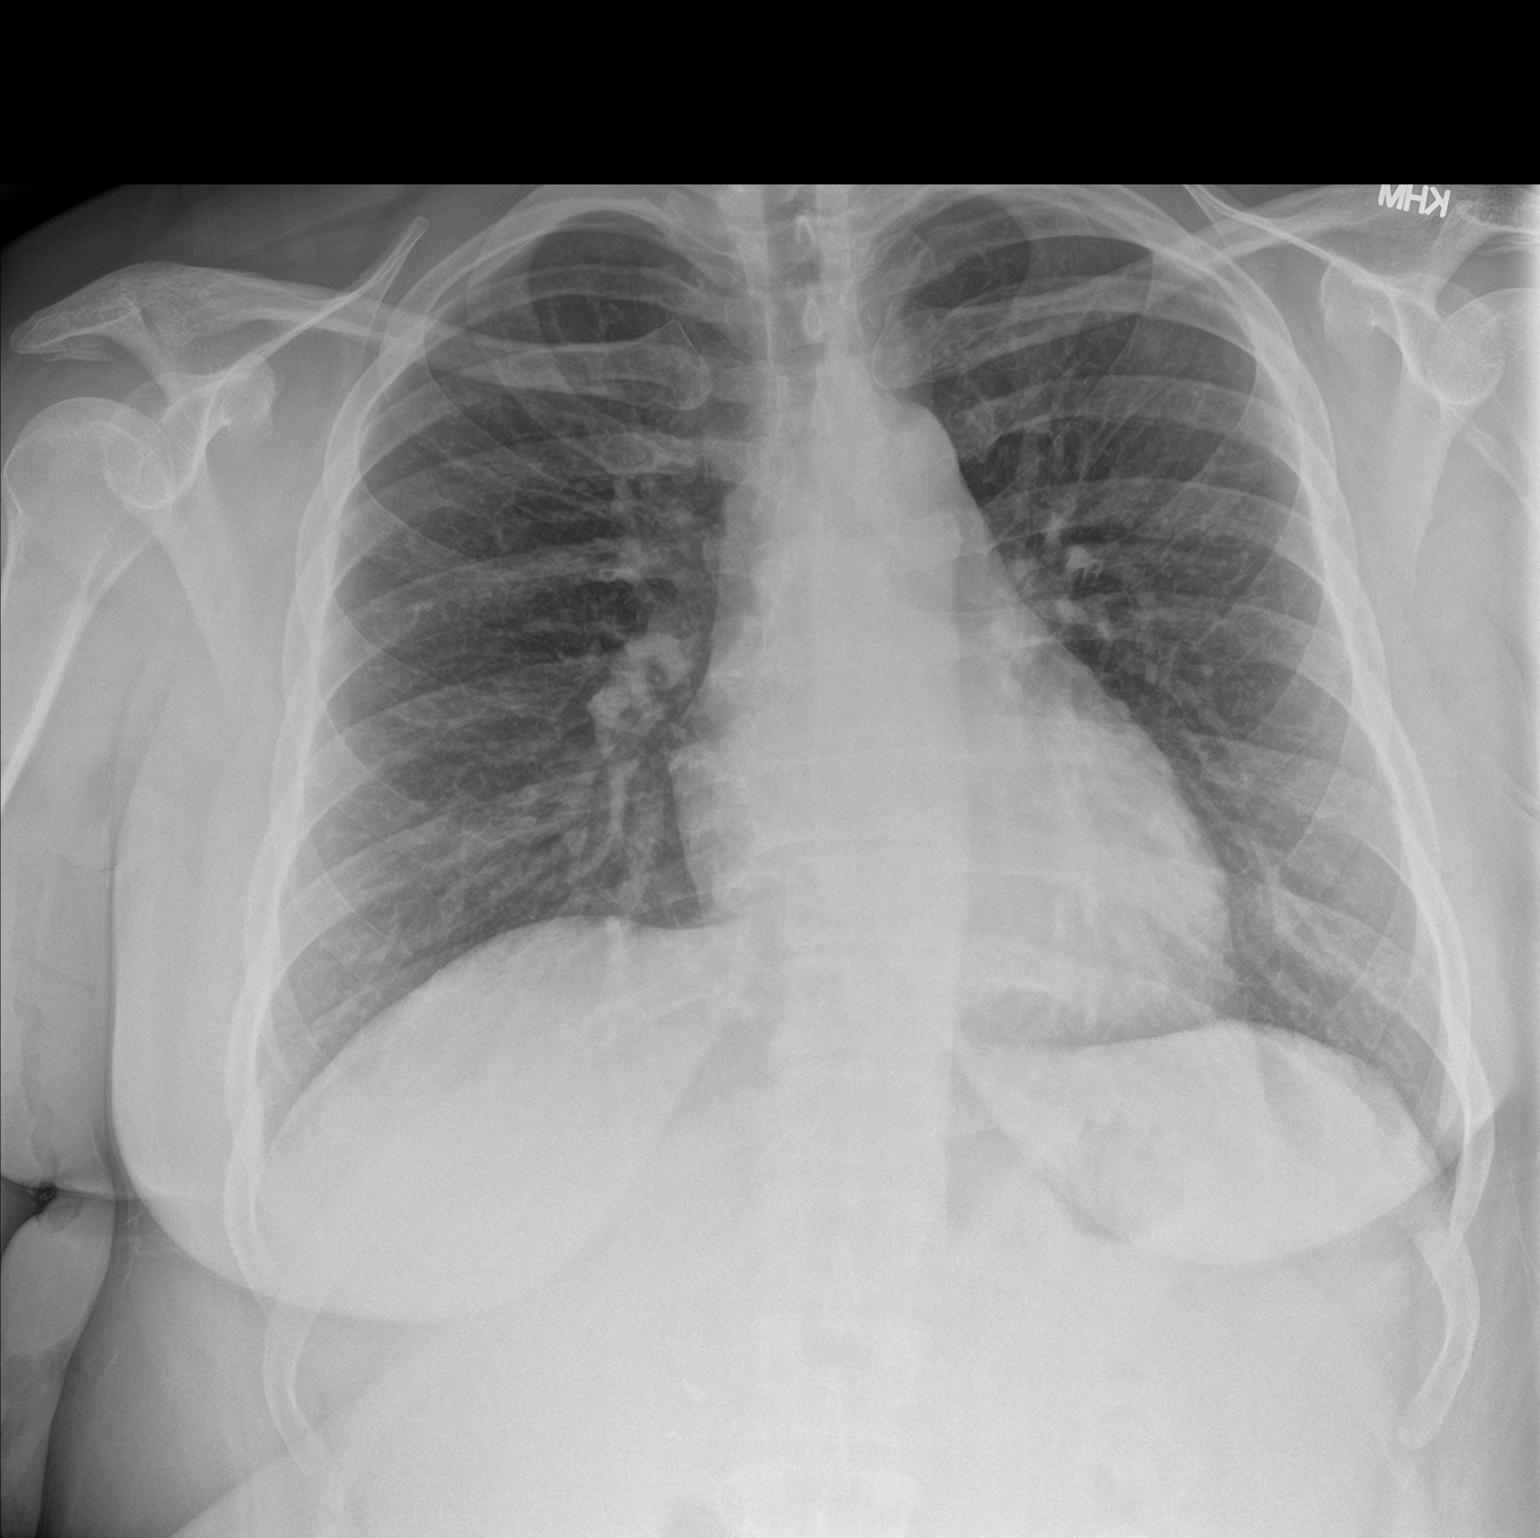

[chest lat]
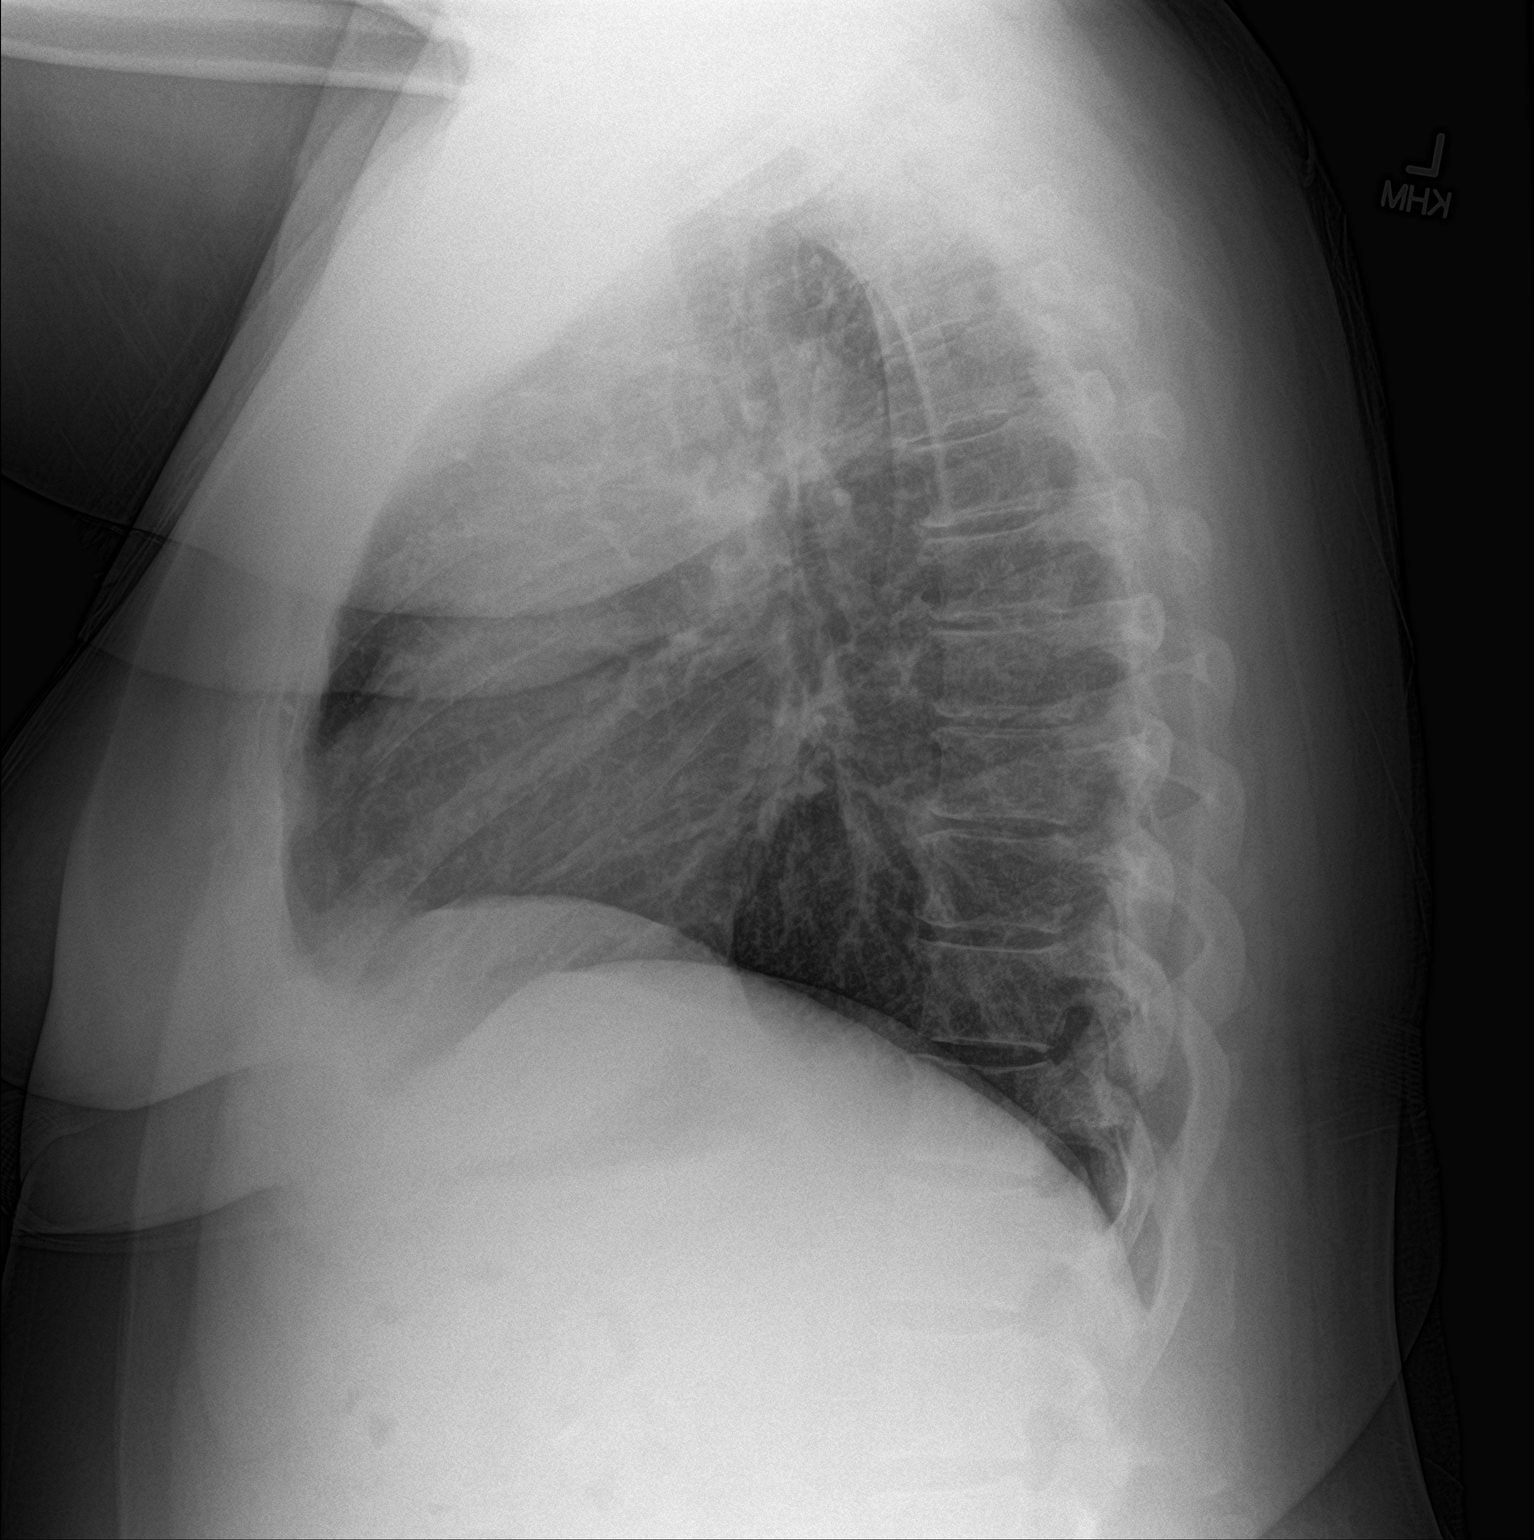

[2 of 2 positions shown; findings below may reference images not displayed]

FINDINGS: The heart size and mediastinal contours are within normal limits.
Both lungs are clear. The visualized skeletal structures are
unremarkable.
IMPRESSION: No active cardiopulmonary disease.

## 2018-10-12 MED ORDER — ALBUTEROL SULFATE HFA 108 (90 BASE) MCG/ACT IN AERS
2.0000 | INHALATION_SPRAY | Freq: Once | RESPIRATORY_TRACT | Status: AC
  Administered 2018-10-12: 2 via RESPIRATORY_TRACT
  Filled 2018-10-12: qty 6.7

## 2018-10-12 MED ORDER — BENZONATATE 100 MG PO CAPS: 200.0000 mg | ORAL_CAPSULE | Freq: Three times a day (TID) | ORAL | 0 refills | Status: DC | PRN

## 2018-10-12 MED ORDER — BENZONATATE 100 MG PO CAPS
200.0000 mg | ORAL_CAPSULE | Freq: Once | ORAL | Status: AC
  Administered 2018-10-12: 200 mg via ORAL
  Filled 2018-10-12: qty 2

## 2018-10-12 MED ORDER — AEROCHAMBER PLUS FLO-VU MEDIUM MISC
1.0000 | Freq: Once | Status: AC
  Administered 2018-10-12: 1

## 2018-10-12 NOTE — ED Notes (Signed)
From Rad 

## 2018-10-12 NOTE — Discharge Instructions (Addendum)
Rest to make sure you are drinking plenty of fluids.  Use the Tessalon prescription to help you with your cough.  I also recommend using the albuterol inhaler that we gave you using a spacer, 2 puffs every 4 hours if you are coughing or wheezing.  Since you are watching your blood pressure, I recommend looking for Coricidin brand cough and cold medications.  These medicines can also help you with your symptoms and will not interfere with your blood pressure.  Your chest x-ray is clear today, no sign of pneumonia.

## 2018-10-12 NOTE — ED Triage Notes (Signed)
Pt reports cough, nasal drainage and chest congestion x 3 days, denies fever

## 2018-10-12 NOTE — ED Provider Notes (Signed)
[facility] EMERGENCY DEPARTMENT Provider Note   CSN: [id number] Arrival date & time: 10/12/18  1034     History   Chief Complaint Chief Complaint  Patient presents with  . URI    HPI Alexandra Shaffer is a 47 y.o. female presenting with a 3  day history of uri type symptoms which includes nasal congestion with clear rhinorrhea, dry throat and cough which has been productive of a thick white to slightly yellow tinged sputum.  She denies fevers,chills, chest pain or shortness of breath, but feels tight in her chest with intermittent wheezing.  Symptoms do not include abdominal pain, nausea, vomiting or diarrhea.  The patient has used cough prior to arrival with no significant improvement in symptoms. She endorses concern for taking cough and cold remedies as she has had borderline elevated blood pressures and was told to avoid these type medicines.  She has a distant hx of cigarette smoking, none x 10 years. .  The history is provided by the patient.    Past Medical History:  Diagnosis Date  . Anxiety   . Depression   . Hot flashes 11/26/2014  . Mood swings 11/26/2014    Patient Active Problem List   Diagnosis Date Noted  . Hot flashes 11/26/2014  . Mood swings 11/26/2014    Past Surgical History:  Procedure Laterality Date  . CHOLECYSTECTOMY       OB History    Gravida  2   Para  2   Term      Preterm      AB      Living  2     SAB      TAB      Ectopic      Multiple      Live Births               Home Medications    Prior to Admission medications   Medication Sig Start Date End Date Taking? Authorizing Provider  acetaminophen (TYLENOL) 500 MG tablet Take 1,000 mg by mouth every 6 (six) hours as needed for mild pain or headache.     [provider]  ALPRAZolam Prudy Feeler) 1 MG tablet Take 0.5-1 mg by mouth 2 (two) times daily as needed for anxiety.     [provider]  amoxicillin (AMOXIL) 500 MG capsule Take 1 capsule (500 mg  total) by mouth 3 (three) times daily. 07/03/18   Elson Areas, PA-C  benzonatate (TESSALON) 100 MG capsule Take 2 capsules (200 mg total) by mouth 3 (three) times daily as needed. 10/12/18   Burgess Amor, PA-C  escitalopram (LEXAPRO) 20 MG tablet Take 20 mg by mouth daily.    [provider]  ibuprofen (ADVIL,MOTRIN) 200 MG tablet Take 600 mg by mouth 2 (two) times daily as needed for cramping.     [provider]  mirabegron ER (MYRBETRIQ) 25 MG TB24 tablet Take 1 tablet (25 mg total) by mouth daily. Patient not taking: Reported on 07/03/2018 08/21/17   Adline Potter, NP    Family History Family History  Problem Relation Age of Onset  . Anxiety disorder Mother   . Stroke Father   . Hypertension Father   . Diabetes Father   . Other Father        has a pacemaker  . Anxiety disorder Sister   . Depression Sister   . Hyperlipidemia Daughter   . Cancer Maternal Grandmother   . Diabetes Maternal Grandmother   .  Stroke Maternal Grandmother   . Heart disease Maternal Grandmother   . Kidney disease Maternal Grandmother   . Cancer Maternal Grandfather   . Emphysema Paternal Grandmother   . Stroke Paternal Grandmother   . Heart disease Paternal Grandmother   . Diabetes Paternal Grandmother   . Other Paternal Grandfather        "black lung"    Social History Social History   Tobacco Use  . Smoking status: Former Smoker    Types: Cigarettes  . Smokeless tobacco: Never Used  Substance Use Topics  . Alcohol use: No  . Drug use: No     Allergies   Codeine and Keflex [cephalexin]   Review of Systems Review of Systems  Constitutional: Negative for chills and fever.  HENT: Positive for congestion and rhinorrhea. Negative for ear pain, sinus pressure, sore throat, trouble swallowing and voice change.   Eyes: Negative for discharge.  Respiratory: Positive for cough, chest tightness and wheezing. Negative for shortness of breath and stridor.     Cardiovascular: Negative for chest pain.  Gastrointestinal: Negative for abdominal pain.  Genitourinary: Negative.      Physical Exam Updated Vital Signs BP 121/61 (BP Location: Right Arm)   Pulse 77   Temp 98.2 F (36.8 C) (Oral)   Resp 19   Ht 5\' 10"  (1.778 m)   Wt (!) 156 kg   LMP 09/17/2018   SpO2 99%   BMI 49.36 kg/m   Physical Exam Constitutional:      Appearance: She is well-developed.  HENT:     Head: Normocephalic and atraumatic.     Right Ear: Tympanic membrane and ear canal normal.     Left Ear: Tympanic membrane and ear canal normal.     Nose: Mucosal edema, congestion and rhinorrhea present.     Mouth/Throat:     Pharynx: Uvula midline. No oropharyngeal exudate or posterior oropharyngeal erythema.     Tonsils: No tonsillar abscesses.  Eyes:     Conjunctiva/sclera: Conjunctivae normal.  Cardiovascular:     Rate and Rhythm: Normal rate.     Heart sounds: Normal heart sounds.  Pulmonary:     Effort: Pulmonary effort is normal. No respiratory distress.     Breath sounds: No stridor. Wheezing present. No rhonchi or rales.     Comments: Expiratory wheeze with first respiratory cycle which then clears, left upper. Decreased breath sounds bilateral bases, no rhonchi or rales. Chest:     Chest wall: No tenderness.  Musculoskeletal: Normal range of motion.  Skin:    General: Skin is warm and dry.     Findings: No rash.  Neurological:     Mental Status: She is alert and oriented to person, place, and time.      ED Treatments / Results  Labs (all labs ordered are listed, but only abnormal results are displayed) Labs Reviewed - No data to display  EKG None  Radiology Dg Chest 2 View  Result Date: 10/12/2018 CLINICAL DATA:  Cough and chest congestion for 3 days. EXAM: CHEST - 2 VIEW COMPARISON:  05/03/2017 FINDINGS: The heart size and mediastinal contours are within normal limits. Both lungs are clear. The visualized skeletal structures are  unremarkable. IMPRESSION: No active cardiopulmonary disease. Electronically Signed   By: Myles RosenthalJohn  Stahl M.D.   On: 10/12/2018 11:36    Procedures Procedures (including critical care time)  Medications Ordered in ED Medications  AEROCHAMBER PLUS FLO-VU MEDIUM MISC 1 each (has no administration in time range)  benzonatate (  TESSALON) capsule 200 mg (200 mg Oral Given 10/12/18 1156)  albuterol (PROVENTIL HFA;VENTOLIN HFA) 108 (90 Base) MCG/ACT inhaler 2 puff (2 puffs Inhalation Given 10/12/18 1154)     Initial Impression / Assessment and Plan / ED Course  I have reviewed the triage vital signs and the nursing notes.  Pertinent labs & imaging results that were available during my care of the patient were reviewed by me and considered in my medical decision making (see chart for details).     Pt with exam suggesting acute bronchitis. cxr negative for pneumonia. Sparse expiratory wheeze, improved with albuterol mdi. No indication for steroids at this time.  She was given albuterol with spacer, also tessalon, recommended coricidin products if concern for bp.  Plan f/u with pcp or recheck here for any worsened or persistent sx.  VSS, no hypoxia.    Final Clinical Impressions(s) / ED Diagnoses   Final diagnoses:  Acute bronchitis, unspecified organism    ED Discharge Orders         Ordered    benzonatate (TESSALON) 100 MG capsule  3 times daily PRN     10/12/18 1217           Burgess Amordol, Allyah Heather, PA-C 10/12/18 1231    Pricilla LovelessGoldston, Scott, MD 10/12/18 1447

## 2018-10-12 NOTE — ED Notes (Signed)
Pt reports cough with clear and sometimes green expectorant x 3 days  She has diminished lung sounds throughout  Denies pregnancy due to spouse having vasc.

## 2018-10-12 NOTE — ED Notes (Signed)
Call to Resp Re spacer

## 2018-12-16 ENCOUNTER — Emergency Department (HOSPITAL_COMMUNITY)
Admission: EM | Admit: 2018-12-16 | Discharge: 2018-12-16 | Disposition: A | Discharge status: Home / Self Care | Payer: Self-pay | Attending: Emergency Medicine | Admitting: Emergency Medicine

## 2018-12-16 ENCOUNTER — Emergency Department (HOSPITAL_COMMUNITY): Payer: Self-pay

## 2018-12-16 ENCOUNTER — Encounter (HOSPITAL_COMMUNITY): Payer: Self-pay | Admitting: Emergency Medicine

## 2018-12-16 ENCOUNTER — Other Ambulatory Visit: Payer: Self-pay

## 2018-12-16 DIAGNOSIS — T148XXA Other injury of unspecified body region, initial encounter: Secondary | ICD-10-CM

## 2018-12-16 DIAGNOSIS — S39011A Strain of muscle, fascia and tendon of abdomen, initial encounter: Secondary | ICD-10-CM | POA: Insufficient documentation

## 2018-12-16 DIAGNOSIS — X58XXXA Exposure to other specified factors, initial encounter: Secondary | ICD-10-CM | POA: Insufficient documentation

## 2018-12-16 DIAGNOSIS — Y929 Unspecified place or not applicable: Secondary | ICD-10-CM | POA: Insufficient documentation

## 2018-12-16 DIAGNOSIS — Y939 Activity, unspecified: Secondary | ICD-10-CM | POA: Insufficient documentation

## 2018-12-16 DIAGNOSIS — Y999 Unspecified external cause status: Secondary | ICD-10-CM | POA: Insufficient documentation

## 2018-12-16 LAB — CBC WITH DIFFERENTIAL/PLATELET
Abs Immature Granulocytes: 0.02 10*3/uL (ref 0.00–0.07)
Basophils Absolute: 0 10*3/uL (ref 0.0–0.1)
Basophils Relative: 0 %
EOS PCT: 4 %
Eosinophils Absolute: 0.3 10*3/uL (ref 0.0–0.5)
HEMATOCRIT: 40.4 % (ref 36.0–46.0)
HEMOGLOBIN: 12.7 g/dL (ref 12.0–15.0)
Immature Granulocytes: 0 %
LYMPHS ABS: 2.8 10*3/uL (ref 0.7–4.0)
LYMPHS PCT: 29 %
MCH: 27.1 pg (ref 26.0–34.0)
MCHC: 31.4 g/dL (ref 30.0–36.0)
MCV: 86.3 fL (ref 80.0–100.0)
MONO ABS: 0.8 10*3/uL (ref 0.1–1.0)
MONOS PCT: 8 %
Neutro Abs: 5.5 10*3/uL (ref 1.7–7.7)
Neutrophils Relative %: 59 %
Platelets: 225 10*3/uL (ref 150–400)
RBC: 4.68 MIL/uL (ref 3.87–5.11)
RDW: 14.7 % (ref 11.5–15.5)
WBC: 9.4 10*3/uL (ref 4.0–10.5)
nRBC: 0 % (ref 0.0–0.2)

## 2018-12-16 LAB — BASIC METABOLIC PANEL
ANION GAP: 7 (ref 5–15)
BUN: 12 mg/dL (ref 6–20)
CHLORIDE: 104 mmol/L (ref 98–111)
CO2: 26 mmol/L (ref 22–32)
Calcium: 8.8 mg/dL — ABNORMAL LOW (ref 8.9–10.3)
Creatinine, Ser: 0.69 mg/dL (ref 0.44–1.00)
GFR calc Af Amer: >60 mL/min (ref >60)
GFR calc non Af Amer: >60 mL/min (ref >60)
Glucose, Bld: 88 mg/dL (ref 70–99)
POTASSIUM: 4.1 mmol/L (ref 3.5–5.1)
SODIUM: 137 mmol/L (ref 135–145)

## 2018-12-16 LAB — URINALYSIS, ROUTINE W REFLEX MICROSCOPIC
BILIRUBIN URINE: NEGATIVE
Glucose, UA: NEGATIVE mg/dL
HGB URINE DIPSTICK: NEGATIVE
Ketones, ur: NEGATIVE mg/dL
Leukocytes,Ua: NEGATIVE
Nitrite: NEGATIVE
PH: 7 (ref 5.0–8.0)
Protein, ur: NEGATIVE mg/dL
SPECIFIC GRAVITY, URINE: 1.012 (ref 1.005–1.030)

## 2018-12-16 IMAGING — CT CT ABD-PELV W/ CM
2 of 6 series · 15 of 46 positions shown, 17 images · IV contrast (Isovue)
Comparison: None.

CLINICAL DATA: Left lower quadrant and suprapubic pain, burning
with urination

EXAM:
CT ABDOMEN AND PELVIS WITH CONTRAST
TECHNIQUE: Multidetector CT imaging of the abdomen and pelvis was performed
using the standard protocol following bolus administration of
intravenous contrast.
CONTRAST:  100mL OMNIPAQUE IOHEXOL 300 MG/ML  SOLN

[Series 2: axial st · axial · 0.81mm/px · z∈[+1008,+1443]mm · 12 of 101 slices shown, 14 images]
[im 7/101  soft-tissue]
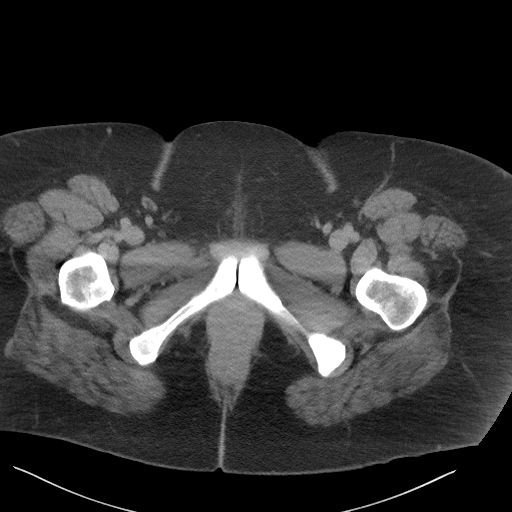
[im 7/101  bone]
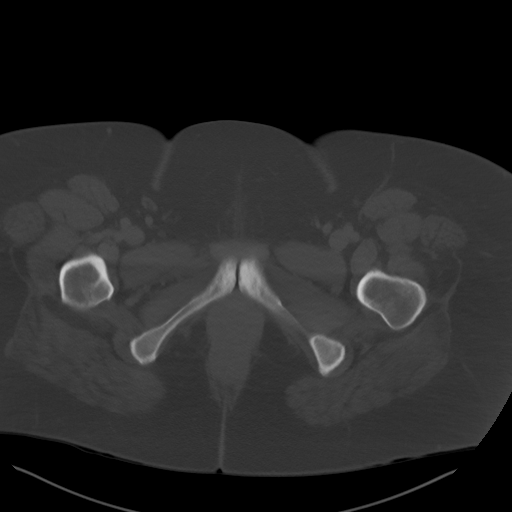
[im 13/101  soft-tissue]
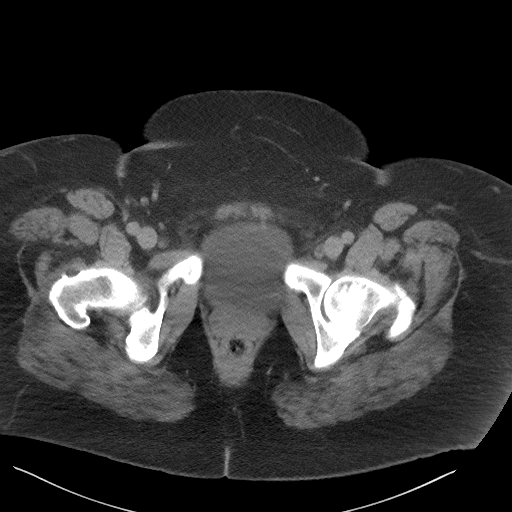
[im 26/101  soft-tissue]
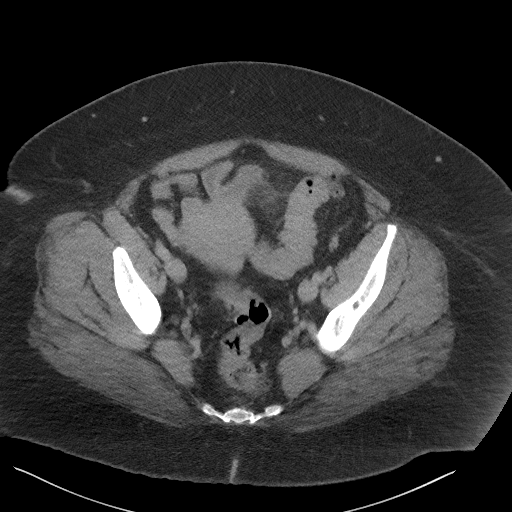
[im 32/101  soft-tissue]
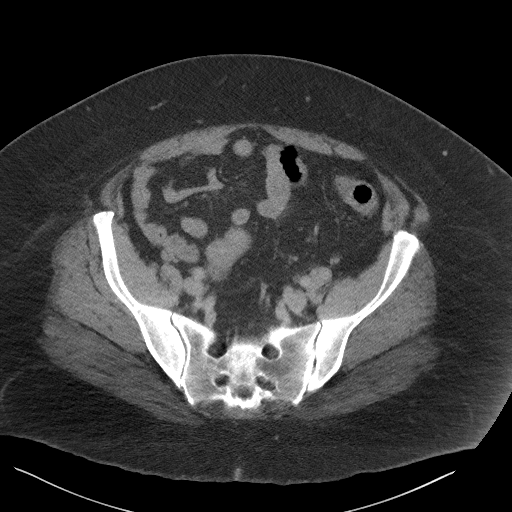
[im 38/101  soft-tissue]
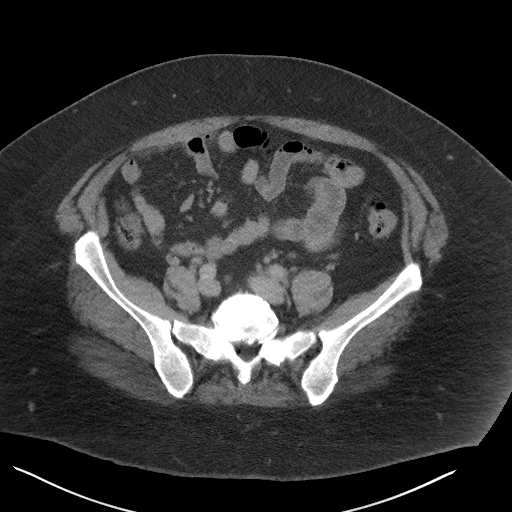
[im 44/101  soft-tissue]
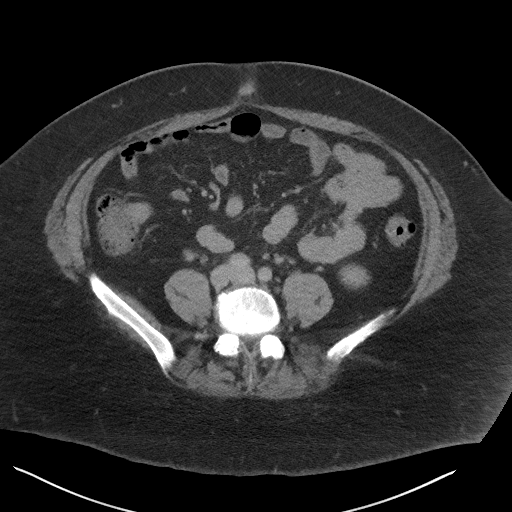
[im 57/101  soft-tissue]
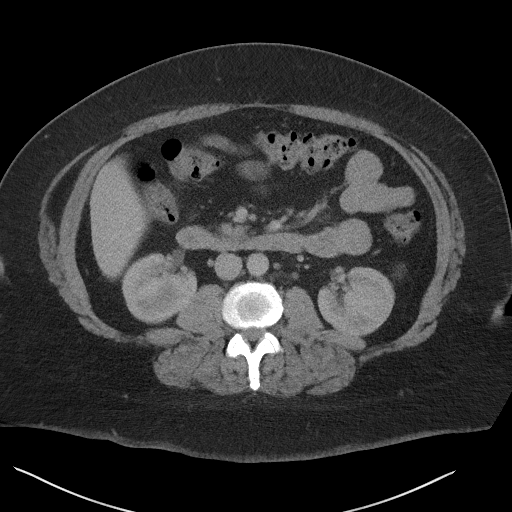
[im 63/101  soft-tissue]
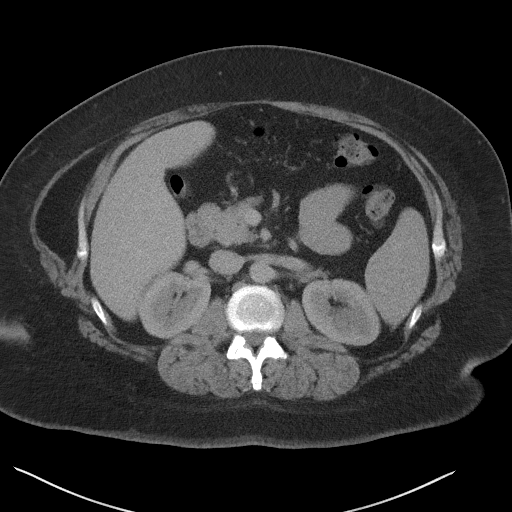
[im 69/101  soft-tissue]
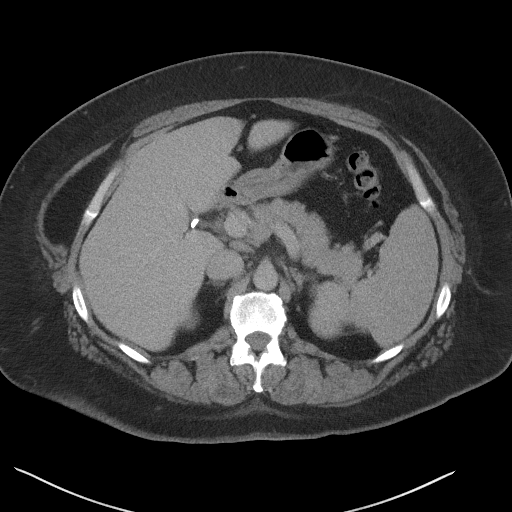
[im 69/101  bone]
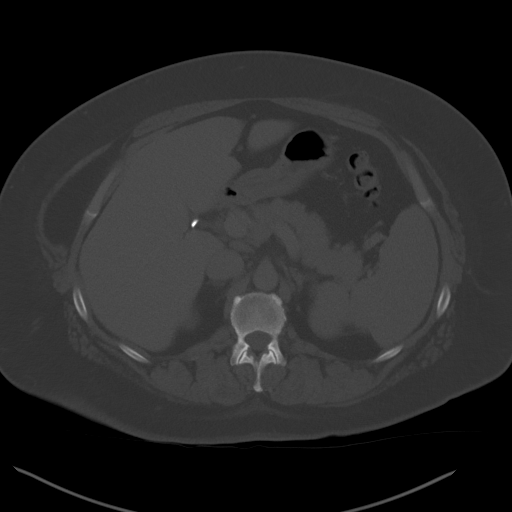
[im 76/101  soft-tissue]
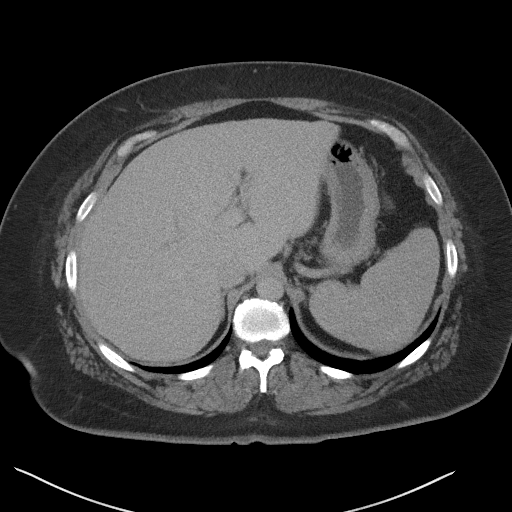
[im 88/101  soft-tissue]
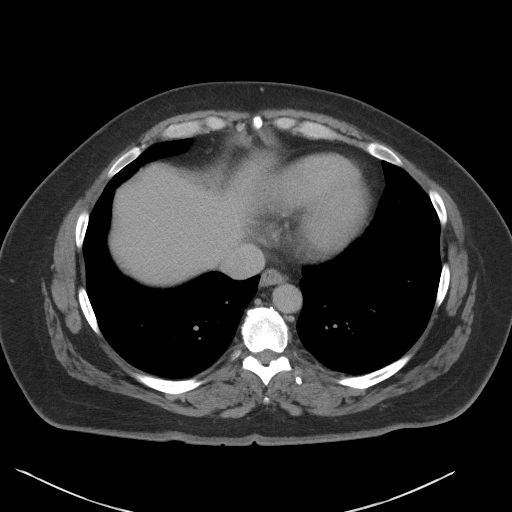
[im 94/101  soft-tissue]
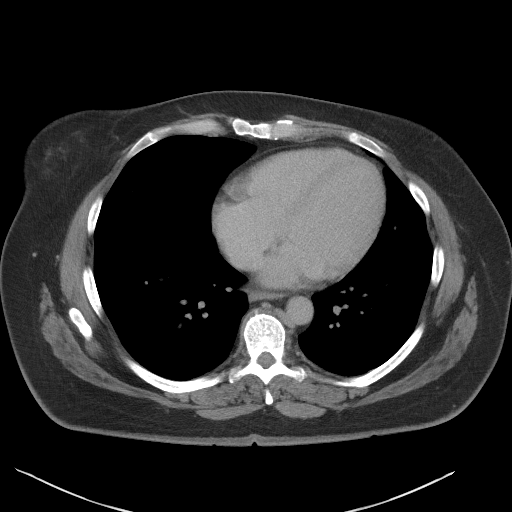

[Series 7: coronal st · coronal · 0.75mm/px · 3 of 94 slices shown]
[im 32/94  soft-tissue]
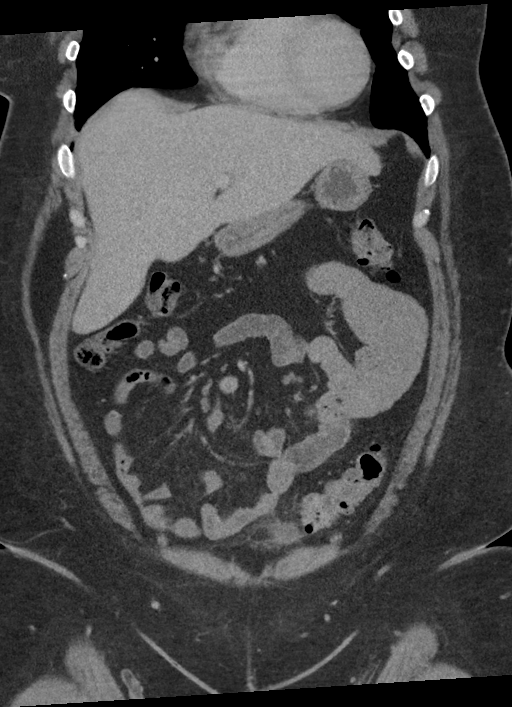
[im 42/94  soft-tissue]
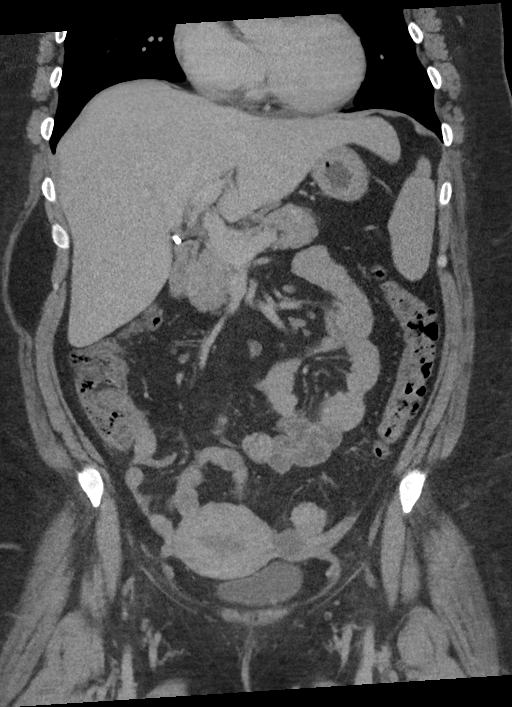
[im 52/94  soft-tissue]
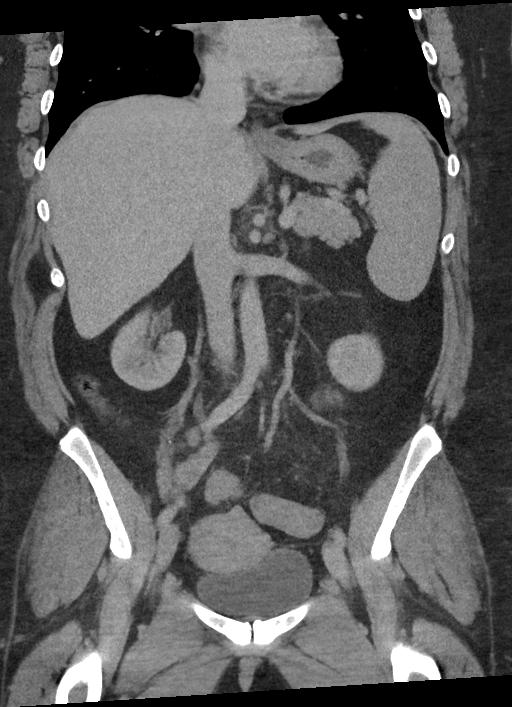

[15 of 46 positions shown; findings below may reference images not displayed]

FINDINGS: Lower chest: No acute abnormality. There is at least 1 pulmonary
nodule in the bilateral lung bases, a 5 mm nodule of the right lower
lobe (series 2, image 6). Suspect an additional, partially imaged
nodule of the right middle lobe measuring at least 6 mm (series 2,
image 1).

Hepatobiliary: No focal liver abnormality is seen. Status post
cholecystectomy. No biliary dilatation.

Pancreas: Unremarkable. No pancreatic ductal dilatation or
surrounding inflammatory changes.

Spleen: Normal in size without focal abnormality.

Adrenals/Urinary Tract: Adrenal glands are unremarkable. Kidneys are
normal, without renal calculi, focal lesion, or hydronephrosis.
Bladder is unremarkable.

Stomach/Bowel: Stomach is within normal limits. Appendix appears
normal. No evidence of bowel wall thickening, distention, or
inflammatory changes. Occasional colonic diverticula.

Vascular/Lymphatic: No significant vascular findings are present. No
enlarged abdominal or pelvic lymph nodes.

Reproductive: Nabothian cysts of the cervix. Fluid in the
endocervical canal. Fluid attenuation lesions of the left ovary,
consistent with functional cysts or follicle in the reproductive age
setting.

Other: No abdominal wall hernia. Incidental lipoma of right oblique
musculature (series 2, image 36). No abdominopelvic ascites.

Musculoskeletal: No acute or significant osseous findings.
IMPRESSION: 1. No CT findings of the abdomen or pelvis to explain left lower
quadrant or suprapubic abdominal pain and dysuria. No evidence of
urinary tract calculus or hydronephrosis.

2. There is at least 1 pulmonary nodule in the bilateral lung bases,
a 5 mm nodule of the right lower lobe (series 2, image 6). Suspect
an additional, partially imaged nodule of the right middle lobe
measuring at least 6 mm (series 2, image 1). Recommend follow-up CT
of the chest at 3-6 months to ensure stability or resolution per
[RJ] [HOSPITAL] criteria.

3.  Other chronic and incidental findings as detailed above.

## 2018-12-16 MED ORDER — ONDANSETRON HCL 4 MG/2ML IJ SOLN
4.0000 mg | Freq: Once | INTRAMUSCULAR | Status: DC
  Filled 2018-12-16: qty 2

## 2018-12-16 MED ORDER — HYDROMORPHONE HCL 1 MG/ML IJ SOLN
0.5000 mg | Freq: Once | INTRAMUSCULAR | Status: DC
  Filled 2018-12-16: qty 1

## 2018-12-16 MED ORDER — IOHEXOL 300 MG/ML  SOLN
100.0000 mL | Freq: Once | INTRAMUSCULAR | Status: AC | PRN
  Administered 2018-12-16: 100 mL via INTRAVENOUS

## 2018-12-16 NOTE — ED Notes (Signed)
Patient reports lower abdominal discomfort relieved by voiding. Reports burning with urination. Denies hematuria or fever, denies nausea or vomiting. No CVA tenderness.

## 2018-12-16 NOTE — ED Provider Notes (Signed)
[facility] EMERGENCY DEPARTMENT Provider Note   CSN: [id number] Arrival date & time: 12/16/18  1050    History   Chief Complaint Chief Complaint  Patient presents with  . Dysuria    HPI Alexandra Shaffer is a 48 y.o. female.     Patient is a 48 year old female who presents to the emergency department with a complaint of lower abdomen pain.  The patient states that when her bladder feels she has pain in the lower abdomen.  It feels better after she urinates.  She does not have burning with urination.  There burning at times with urination.  There is no hematuria appreciated.  There is been no injury to the lower abdomen.  The patient does not recall doing any excessive lifting, but states that she has 7 grandchildren that she lifts from time to time.  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  She does not recall injuring her lower abdomen area muscles.  She presents now for assistance with this issue.  The history is provided by the patient.    Past Medical History:  Diagnosis Date  . Anxiety   . Depression   . Hot flashes 11/26/2014  . Mood swings 11/26/2014    Patient Active Problem List   Diagnosis Date Noted  . Hot flashes 11/26/2014  . Mood swings 11/26/2014    Past Surgical History:  Procedure Laterality Date  . CHOLECYSTECTOMY       OB History    Gravida  2   Para  2   Term      Preterm      AB      Living  2     SAB      TAB      Ectopic      Multiple      Live Births               Home Medications    Prior to Admission medications   Medication Sig Start Date End Date Taking? Authorizing Provider  acetaminophen (TYLENOL) 500 MG tablet Take 1,000 mg by mouth every 6 (six) hours as needed for mild pain or headache.     [provider]  ALPRAZolam Prudy Feeler) 1 MG tablet Take 0.5-1 mg by mouth 2 (two) times daily as needed for anxiety.     [provider]  amoxicillin (AMOXIL) 500 MG capsule Take 1 capsule  (500 mg total) by mouth 3 (three) times daily. 07/03/18   Elson Areas, PA-C  benzonatate (TESSALON) 100 MG capsule Take 2 capsules (200 mg total) by mouth 3 (three) times daily as needed. 10/12/18   Burgess Amor, PA-C  escitalopram (LEXAPRO) 20 MG tablet Take 20 mg by mouth daily.    [provider]  ibuprofen (ADVIL,MOTRIN) 200 MG tablet Take 600 mg by mouth 2 (two) times daily as needed for cramping.     [provider]  mirabegron ER (MYRBETRIQ) 25 MG TB24 tablet Take 1 tablet (25 mg total) by mouth daily. Patient not taking: Reported on 07/03/2018 08/21/17   Adline Potter, NP    Family History Family History  Problem Relation Age of Onset  . Anxiety disorder Mother   . Stroke Father   . Hypertension Father   . Diabetes Father   . Other Father        has a pacemaker  . Anxiety disorder Sister   . Depression Sister   . Hyperlipidemia Daughter   .  Cancer Maternal Grandmother   . Diabetes Maternal Grandmother   . Stroke Maternal Grandmother   . Heart disease Maternal Grandmother   . Kidney disease Maternal Grandmother   . Cancer Maternal Grandfather   . Emphysema Paternal Grandmother   . Stroke Paternal Grandmother   . Heart disease Paternal Grandmother   . Diabetes Paternal Grandmother   . Other Paternal Grandfather        "black lung"    Social History Social History   Tobacco Use  . Smoking status: Former Smoker    Types: Cigarettes  . Smokeless tobacco: Never Used  Substance Use Topics  . Alcohol use: No  . Drug use: No     Allergies   Codeine and Keflex [cephalexin]   Review of Systems Review of Systems  Constitutional: Negative for activity change.       All ROS Neg except as noted in HPI  HENT: Negative for nosebleeds.   Eyes: Negative for photophobia and discharge.  Respiratory: Negative for cough, shortness of breath and wheezing.   Cardiovascular: Negative for chest pain and palpitations.  Gastrointestinal: Positive for  abdominal pain. Negative for blood in stool, diarrhea and nausea.  Genitourinary: Negative for dysuria, frequency and hematuria.  Musculoskeletal: Negative for arthralgias, back pain and neck pain.  Skin: Negative.   Neurological: Negative for dizziness, seizures and speech difficulty.  Psychiatric/Behavioral: Negative for confusion and hallucinations.     Physical Exam Updated Vital Signs BP (!) 118/54 (BP Location: Right Arm)   Pulse 73   Temp 98 F (36.7 C) (Oral)   Resp 18   Ht  (1.778 m)   Wt (!) 156 kg   LMP 11/25/2018   SpO2 98%   BMI 49.36 kg/m   Physical Exam Vitals signs and nursing note reviewed.  Constitutional:      Appearance: She is well-developed. She is not toxic-appearing.  HENT:     Head: Normocephalic.     Right Ear: Tympanic membrane and external ear normal.     Left Ear: Tympanic membrane and external ear normal.  Eyes:     General: Lids are normal.     Pupils: Pupils are equal, round, and reactive to light.  Neck:     Musculoskeletal: Normal range of motion and neck supple.     Vascular: No carotid bruit.  Cardiovascular:     Rate and Rhythm: Normal rate and regular rhythm.     Pulses: Normal pulses.     Heart sounds: Normal heart sounds.  Pulmonary:     Effort: No respiratory distress.     Breath sounds: Normal breath sounds.  Abdominal:     General: Bowel sounds are normal.     Palpations: Abdomen is soft.     Tenderness: There is abdominal tenderness in the suprapubic area and left lower quadrant. There is no guarding.     Comments: Patient has pain to palpation in the suprapubic area extending under the pannus.  There is no visible redness or palpable raised area.  There is some mild change with cough.  And coughing causes pain in the area.  No actual hernia appreciated at this time.  No CVA tenderness noted.  Musculoskeletal: Normal range of motion.  Lymphadenopathy:     Head:     Right side of head: No submandibular adenopathy.      Left side of head: No submandibular adenopathy.     Cervical: No cervical adenopathy.  Skin:    General: Skin is warm  and dry.  Neurological:     Mental Status: She is alert and oriented to person, place, and time.     Cranial Nerves: No cranial nerve deficit.     Sensory: No sensory deficit.  Psychiatric:        Speech: Speech normal.      ED Treatments / Results  Labs (all labs ordered are listed, but only abnormal results are displayed) Labs Reviewed  URINALYSIS, ROUTINE W REFLEX MICROSCOPIC - Abnormal; Notable for the following components:      Result Value   APPearance HAZY (*)    All other components within normal limits  BASIC METABOLIC PANEL - Abnormal; Notable for the following components:   Calcium 8.8 (*)    All other components within normal limits  CBC WITH DIFFERENTIAL/PLATELET    EKG None  Radiology Ct Abdomen Pelvis W Contrast  Result Date: 12/16/2018 CLINICAL DATA:  Left lower quadrant and suprapubic pain, burning with urination EXAM: CT ABDOMEN AND PELVIS WITH CONTRAST TECHNIQUE: Multidetector CT imaging of the abdomen and pelvis was performed using the standard protocol following bolus administration of intravenous contrast. CONTRAST:  OMNIPAQUE IOHEXOL 300 MG/ML  SOLN COMPARISON:  None. FINDINGS: Lower chest: No acute abnormality. There is at least 1 pulmonary nodule in the bilateral lung bases, a 5 mm nodule of the right lower lobe (series 2, image 6). Suspect an additional, partially imaged nodule of the right middle lobe measuring at least 6 mm (series 2, image 1). Hepatobiliary: No focal liver abnormality is seen. Status post cholecystectomy. No biliary dilatation. Pancreas: Unremarkable. No pancreatic ductal dilatation or surrounding inflammatory changes. Spleen: Normal in size without focal abnormality. Adrenals/Urinary Tract: Adrenal glands are unremarkable. Kidneys are normal, without renal calculi, focal lesion, or hydronephrosis. Bladder is  unremarkable. Stomach/Bowel: Stomach is within normal limits. Appendix appears normal. No evidence of bowel wall thickening, distention, or inflammatory changes. Occasional colonic diverticula. Vascular/Lymphatic: No significant vascular findings are present. No enlarged abdominal or pelvic lymph nodes. Reproductive: Nabothian cysts of the cervix. Fluid in the endocervical canal. Fluid attenuation lesions of the left ovary, consistent with functional cysts or follicle in the reproductive age setting. Other: No abdominal wall hernia. Incidental lipoma of right oblique musculature (series 2, image 36). No abdominopelvic ascites. Musculoskeletal: No acute or significant osseous findings. IMPRESSION: 1. No CT findings of the abdomen or pelvis to explain left lower quadrant or suprapubic abdominal pain and dysuria. No evidence of urinary tract calculus or hydronephrosis. 2. There is at least 1 pulmonary nodule in the bilateral lung bases, a 5 mm nodule of the right lower lobe (series 2, image 6). Suspect an additional, partially imaged nodule of the right middle lobe measuring at least 6 mm (series 2, image 1). Recommend follow-up CT of the chest at 3-6 months to ensure stability or resolution per 2017 Fleischner Society criteria. 3.  Other chronic and incidental findings as detailed above. Electronically Signed   By: Lauralyn Primes M.D.   On: 12/16/2018 16:38    Procedures Procedures (including critical care time)  Medications Ordered in ED Medications  HYDROmorphone (DILAUDID) injection 0.5 mg (0.5 mg Intravenous Refused 12/16/18 1424)  ondansetron (ZOFRAN) injection 4 mg (4 mg Intravenous Refused 12/16/18 1424)  iohexol (OMNIPAQUE) 300 MG/ML solution 100 mL (100 mLs Intravenous Contrast Given 12/16/18 1555)     Initial Impression / Assessment and Plan / ED Course  I have reviewed the triage vital signs and the nursing notes.  Pertinent labs & imaging results that  were available during my care of the  patient were reviewed by me and considered in my medical decision making (see chart for details).          Final Clinical Impressions(s) / ED Diagnoses MDM Vital signs reviewed.  The complete blood count and the basic metabolic panel are negative.  The urine analysis is negative for evidence of infection or kidney stone.  CT scan is negative for acute changes.  And there is no findings consistent with the areas of pain.  I discussed these findings with the patient in terms which he understands.  I discussed with her the possibility of this being related to a muscle strain.  I have asked her to follow-up with Dr. Phillips Odor her primary physician.  Have also asked her to return to the emergency department if any changes in her condition, problems, or concerns.  Muscle relaxer and medication for pain offered.  The patient declines these medications, and states she will use Tylenol and ibuprofen.   Final diagnoses:  Muscle strain    ED Discharge Orders    None       Ivery Quale, PA-C 12/16/18 1735    Vanetta Mulders, MD 12/17/18 [phone]

## 2018-12-16 NOTE — ED Triage Notes (Addendum)
Patient complaining of lower abdominal pain and worsening pain with urination x 2 days. States pain is relieved after urination.

## 2018-12-16 NOTE — Discharge Instructions (Signed)
Your urine test is negative for infection or signs of kidney stone.  Your complete blood count does not show any systemic evidence of infection or bleeding.  The CT scan is negative for any acute changes at this time.  Your examination favors possible muscle strain, or musculoskeletal pain.  Please use Tylenol every 4 hours or ibuprofen every 6 hours.  Please see Dr. Phillips Odor for additional evaluation or return to the emergency department if this is not improving.

## 2018-12-18 LAB — URINE CULTURE

## 2019-04-29 ENCOUNTER — Telehealth: Payer: Self-pay | Admitting: Obstetrics and Gynecology

## 2019-04-29 ENCOUNTER — Telehealth: Payer: Self-pay | Admitting: *Deleted

## 2019-04-29 NOTE — Telephone Encounter (Signed)

## 2019-04-29 NOTE — Telephone Encounter (Signed)
I called patient no answer no voicemail  

## 2019-04-30 ENCOUNTER — Other Ambulatory Visit: Payer: Self-pay | Admitting: Obstetrics and Gynecology

## 2019-06-20 ENCOUNTER — Encounter (HOSPITAL_COMMUNITY): Payer: Self-pay | Admitting: *Deleted

## 2019-06-20 ENCOUNTER — Other Ambulatory Visit: Payer: Self-pay

## 2019-06-20 ENCOUNTER — Emergency Department (HOSPITAL_COMMUNITY): Payer: No Typology Code available for payment source

## 2019-06-20 ENCOUNTER — Emergency Department (HOSPITAL_COMMUNITY)
Admission: EM | Admit: 2019-06-20 | Discharge: 2019-06-20 | Disposition: A | Discharge status: Home / Self Care | Payer: No Typology Code available for payment source | Attending: Emergency Medicine | Admitting: Emergency Medicine

## 2019-06-20 DIAGNOSIS — F410 Panic disorder [episodic paroxysmal anxiety] without agoraphobia: Secondary | ICD-10-CM | POA: Diagnosis not present

## 2019-06-20 DIAGNOSIS — Z87891 Personal history of nicotine dependence: Secondary | ICD-10-CM | POA: Diagnosis not present

## 2019-06-20 DIAGNOSIS — R079 Chest pain, unspecified: Secondary | ICD-10-CM | POA: Diagnosis present

## 2019-06-20 DIAGNOSIS — Z79899 Other long term (current) drug therapy: Secondary | ICD-10-CM | POA: Diagnosis not present

## 2019-06-20 LAB — CBC
HCT: 40.7 % (ref 36.0–46.0)
Hemoglobin: 12.7 g/dL (ref 12.0–15.0)
MCH: 27 pg (ref 26.0–34.0)
MCHC: 31.2 g/dL (ref 30.0–36.0)
MCV: 86.6 fL (ref 80.0–100.0)
Platelets: 243 10*3/uL (ref 150–400)
RBC: 4.7 MIL/uL (ref 3.87–5.11)
RDW: 15.6 % — ABNORMAL HIGH (ref 11.5–15.5)
WBC: 10.2 10*3/uL (ref 4.0–10.5)
nRBC: 0 % (ref 0.0–0.2)

## 2019-06-20 LAB — BASIC METABOLIC PANEL
Anion gap: 8 (ref 5–15)
BUN: 11 mg/dL (ref 6–20)
CO2: 27 mmol/L (ref 22–32)
Calcium: 8.9 mg/dL (ref 8.9–10.3)
Chloride: 104 mmol/L (ref 98–111)
Creatinine, Ser: 0.85 mg/dL (ref 0.44–1.00)
GFR calc Af Amer: >60 mL/min (ref >60)
GFR calc non Af Amer: >60 mL/min (ref >60)
Glucose, Bld: 90 mg/dL (ref 70–99)
Potassium: 3.7 mmol/L (ref 3.5–5.1)
Sodium: 139 mmol/L (ref 135–145)

## 2019-06-20 LAB — TROPONIN I (HIGH SENSITIVITY): Troponin I (High Sensitivity): <2 ng/L (ref <18)

## 2019-06-20 IMAGING — DX CHEST - 2 VIEW
2 series · 2 of 2 positions shown · non-contrast
Comparison: [DATE]

CLINICAL DATA: Chest pain

EXAM:
CHEST - 2 VIEW

[chest pa]
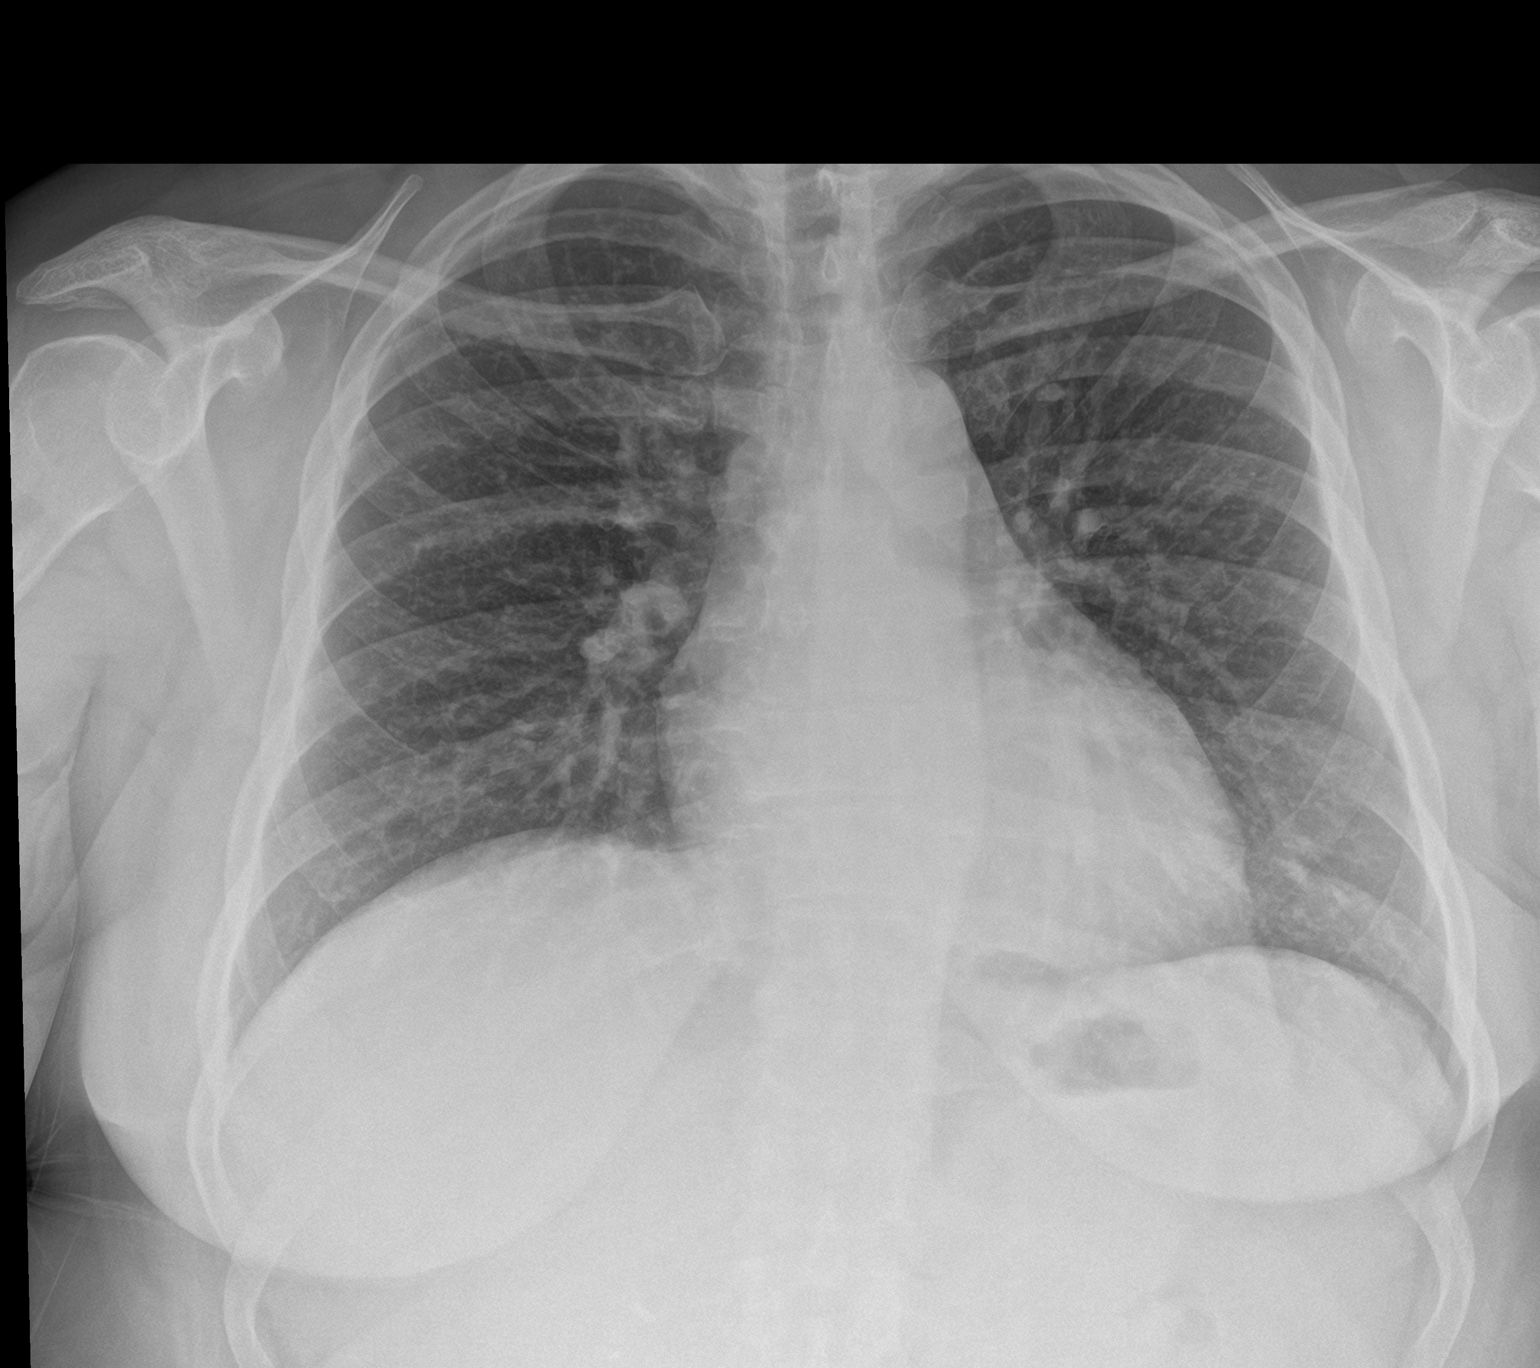

[chest lat]
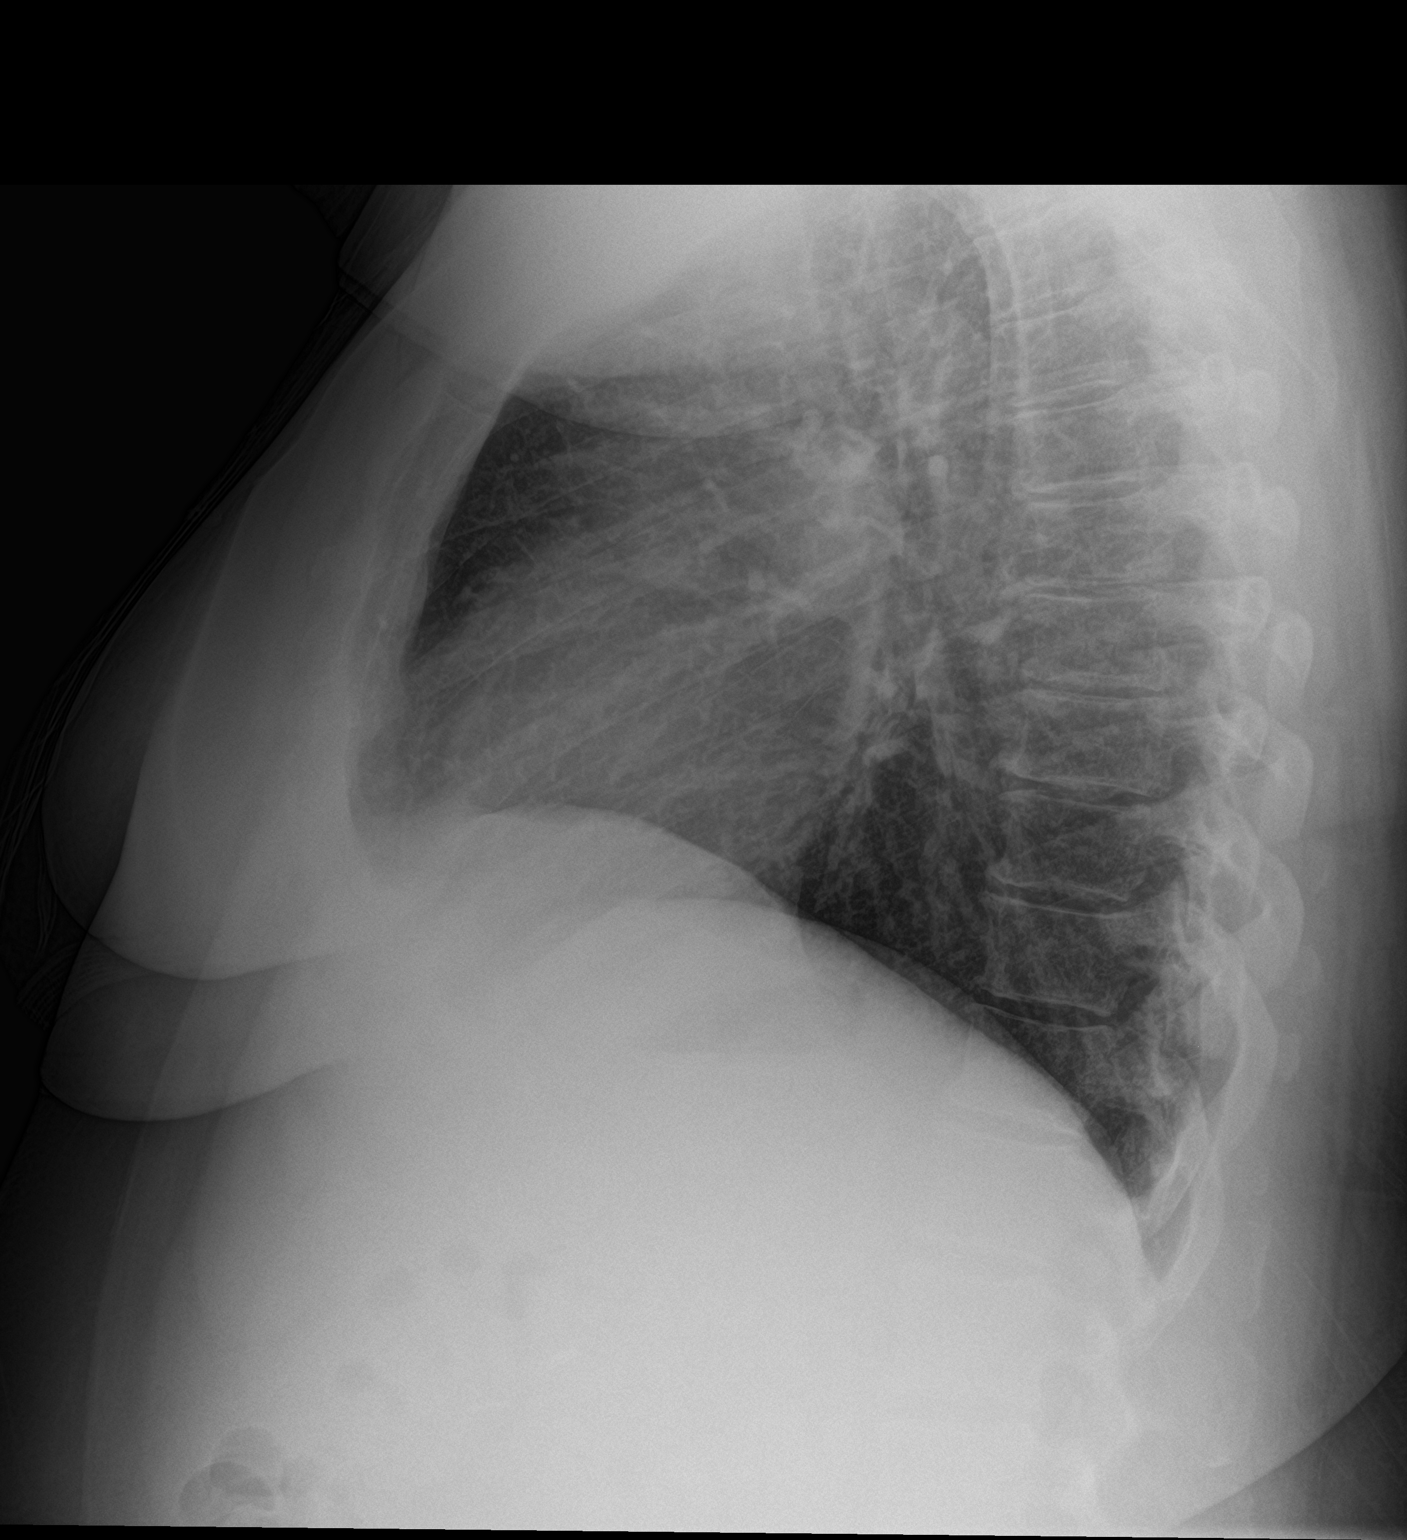

[2 of 2 positions shown; findings below may reference images not displayed]

FINDINGS: The heart size and mediastinal contours are within normal limits.
Both lungs are clear. The visualized skeletal structures are
unremarkable.
IMPRESSION: No active cardiopulmonary disease.

## 2019-06-20 NOTE — ED Provider Notes (Signed)
Medical screening examination/treatment/procedure(s) were conducted as a shared visit with non-physician practitioner(s) and myself.  I personally evaluated the patient during the encounter.  Clinical Impression:   Final diagnoses:  Panic attack   This patient is a 48 year old female, history of anxiety and depression, takes no medications for high blood pressure diabetes cholesterol, does not smoke.  She presents with chest discomfort after having an argument with her significant other.  She states all the symptoms have completely gone away.  She was found to have atrial flutter possibly by her family doctor and referred to cardiology who she will see on Sunday.  At this time she is symptom-free.  On exam she has clear heart and lung sounds, no murmur, no edema, no JVD, well-appearing with vital signs which are unremarkable.  Labs x-ray and EKG are all reassuring and normal.  Patient stable for discharge, agreeable to the plan, low risk for ACS, low risk for PE    Noemi Chapel, MD 06/20/19 2306

## 2019-06-20 NOTE — ED Provider Notes (Signed)
[facility] EMERGENCY DEPARTMENT Provider Note   CSN: [id number] Arrival date & time: 06/20/19  1850     History   Chief Complaint Chief Complaint  Patient presents with  . Chest Pain    HPI Alexandra Shaffer is a 48 y.o. female with a history of anxiety and depression who presents to the ED with acute onset chest pain, jaw pain, nausea, and racing heart.  She states that she had been lying down this afternoon and she developed those symptoms upon waking up and she was concerned for heart attack. At the time of my evaluation, she states that all of her symptoms have since resolved.  She apologizes and "feels foolish" for coming in.  She admits that she is particularly stressed and depressed recently after having a fight with her husband.  She denies any physical or verbal abuse and emphasizes that she has a good support system.  In hindsight she believes that, given her elevated stress levels, she simply had a panic attack.  Currently, she denies any chest pain, back pain, shortness of breath, palpitations, nausea, vomiting, jaw pain, or recent illness.  She also states that her primary care physician has recently diagnosed her with flutter and has referred to [facility] cardiology for evaluation on September 1.      HPI  Past Medical History:  Diagnosis Date  . Anxiety   . Depression   . Hot flashes 11/26/2014  . Mood swings 11/26/2014    Patient Active Problem List   Diagnosis Date Noted  . Hot flashes 11/26/2014  . Mood swings 11/26/2014    Past Surgical History:  Procedure Laterality Date  . CHOLECYSTECTOMY       OB History    Gravida  2   Para  2   Term      Preterm      AB      Living  2     SAB      TAB      Ectopic      Multiple      Live Births               Home Medications    Prior to Admission medications   Medication Sig Start Date End Date Taking? Authorizing Provider  acetaminophen (TYLENOL) 500 MG tablet Take 1,000 mg by mouth every  6 (six) hours as needed for mild pain or headache.     [provider]  ALPRAZolam Prudy Feeler) 1 MG tablet Take 0.5-1 mg by mouth 2 (two) times daily as needed for anxiety.     [provider]  amoxicillin (AMOXIL) 500 MG capsule Take 1 capsule (500 mg total) by mouth 3 (three) times daily. 07/03/18   Elson Areas, PA-C  benzonatate (TESSALON) 100 MG capsule Take 2 capsules (200 mg total) by mouth 3 (three) times daily as needed. 10/12/18   Burgess Amor, PA-C  escitalopram (LEXAPRO) 20 MG tablet Take 20 mg by mouth daily.    [provider]  ibuprofen (ADVIL,MOTRIN) 200 MG tablet Take 600 mg by mouth 2 (two) times daily as needed for cramping.     [provider]  mirabegron ER (MYRBETRIQ) 25 MG TB24 tablet Take 1 tablet (25 mg total) by mouth daily. Patient not taking: Reported on 07/03/2018 08/21/17   Adline Potter, NP    Family History Family History  Problem Relation Age of Onset  . Anxiety disorder Mother   . Stroke Father   . Hypertension Father   .  Diabetes Father   . Other Father        has a pacemaker  . Anxiety disorder Sister   . Depression Sister   . Hyperlipidemia Daughter   . Cancer Maternal Grandmother   . Diabetes Maternal Grandmother   . Stroke Maternal Grandmother   . Heart disease Maternal Grandmother   . Kidney disease Maternal Grandmother   . Cancer Maternal Grandfather   . Emphysema Paternal Grandmother   . Stroke Paternal Grandmother   . Heart disease Paternal Grandmother   . Diabetes Paternal Grandmother   . Other Paternal Grandfather        "black lung"    Social History Social History   Tobacco Use  . Smoking status: Former Smoker    Types: Cigarettes  . Smokeless tobacco: Never Used  Substance Use Topics  . Alcohol use: No  . Drug use: No     Allergies   Codeine and Keflex [cephalexin]   Review of Systems Review of Systems Ten systems are reviewed and are negative for acute change except as noted  in the HPI   Physical Exam Updated Vital Signs BP (!) 121/57   Pulse 69   Temp 98.2 F (36.8 C) (Oral)   Resp (!) 22   Ht 5\' 10"  (1.778 m)   Wt (!) 149.7 kg   LMP 05/24/2019   SpO2 99%   BMI 47.35 kg/m   Physical Exam Constitutional:      Appearance: She is obese.  Eyes:     Extraocular Movements: Extraocular movements intact.     Pupils: Pupils are equal, round, and reactive to light.  Cardiovascular:     Rate and Rhythm: Normal rate and regular rhythm.     Heart sounds: Normal heart sounds.  Pulmonary:     Effort: Pulmonary effort is normal. No tachypnea, accessory muscle usage or respiratory distress.  Chest:     Chest wall: No tenderness.  Abdominal:     Palpations: Abdomen is soft.     Tenderness: There is no abdominal tenderness.  Neurological:     Mental Status: She is alert.  Psychiatric:        Behavior: Behavior normal.      ED Treatments / Results  Labs (all labs ordered are listed, but only abnormal results are displayed) Labs Reviewed  CBC - Abnormal; Notable for the following components:      Result Value   RDW 15.6 (*)    All other components within normal limits  BASIC METABOLIC PANEL  TROPONIN I (HIGH SENSITIVITY)    EKG EKG Interpretation  Date/Time:  Friday June 20 2019 19:04:52 EDT Ventricular Rate:  99 PR Interval:  172 QRS Duration: 84 QT Interval:  350 QTC Calculation: 449 R Axis:   56 Text Interpretation:  Normal sinus rhythm Normal ECG Since last tracing rate faster Confirmed by Eber HongMiller, Brian ([id number]) on 06/20/2019 8:06:15 PM   Radiology Dg Chest 2 View  Result Date: 06/20/2019 CLINICAL DATA:  Chest pain EXAM: CHEST - 2 VIEW COMPARISON:  October 12, 2018 FINDINGS: The heart size and mediastinal contours are within normal limits. Both lungs are clear. The visualized skeletal structures are unremarkable. IMPRESSION: No active cardiopulmonary disease. Electronically Signed   By: Gerome Samavid  Williams III M.D   On: 06/20/2019 19:32     Procedures Procedures (including critical care time)  Medications Ordered in ED Medications - No data to display   Initial Impression / Assessment and Plan / ED Course  I have reviewed  the triage vital signs and the nursing notes.  Pertinent labs & imaging results that were available during my care of the patient were reviewed by me and considered in my medical decision making (see chart for details).  Clinical Course as of Jun 19 2306  Fri Jun 20, 2019  2231 EKG 12-Lead [GG]  2232 EKG 12-Lead [GG]  2232 ED EKG [GG]    Clinical Course User Index [GG] Corena Herter, PA-C       Patient's history and physical exam is consistent with an anxiety attack.  Current vitals, labs, chest x-ray, and EKG all very reassuring.  Patient is no longer symptomatic and believes her earlier symptoms to have been related to her admittedly increased stress levels.  Consider pulmonary embolism, but patient is low risk.  PERC score favorable. She is adamant that she has a very good support system in place and that she is not at all concerned about going home with her husband who was not in the room at the time of evaluation.  She was relieved with her evaluation and feels ready to go home.    Final Clinical Impressions(s) / ED Diagnoses   Final diagnoses:  Panic attack    ED Discharge Orders    None       Reita Chard 06/20/19 2307    Noemi Chapel, MD 06/20/19 2328

## 2019-06-20 NOTE — ED Triage Notes (Signed)
Pt c/o mid chest pain, feeling of her heart racing while laying down today with nausea./ pain is described as sharp, pt states that her jaw and neck have been hurting for the past two days,

## 2019-06-20 NOTE — ED Notes (Signed)
ED Provider at bedside. 

## 2019-06-20 NOTE — Discharge Instructions (Addendum)
Please review attachments. Follow-up at scheduled cardiology appointment on 06/24/2019. Return precautions provided.

## 2019-06-20 NOTE — ED Notes (Signed)
Pt states she feels like this chest pain might be related to her anxiety.

## 2019-06-24 ENCOUNTER — Ambulatory Visit (INDEPENDENT_AMBULATORY_CARE_PROVIDER_SITE_OTHER): Payer: PRIVATE HEALTH INSURANCE | Admitting: Cardiovascular Disease

## 2019-06-24 ENCOUNTER — Other Ambulatory Visit: Payer: Self-pay

## 2019-06-24 ENCOUNTER — Encounter: Payer: Self-pay | Admitting: Cardiovascular Disease

## 2019-06-24 VITALS — BP 130/84 | HR 85 | Temp 98.3°F | Ht 70.0 in | Wt 335.0 lb

## 2019-06-24 DIAGNOSIS — R011 Cardiac murmur, unspecified: Secondary | ICD-10-CM

## 2019-06-24 DIAGNOSIS — R002 Palpitations: Secondary | ICD-10-CM | POA: Diagnosis not present

## 2019-06-24 NOTE — Progress Notes (Signed)
CARDIOLOGY CONSULT NOTE  Patient ID: Alexandra Shaffer Koral MRN: [id number] DOB/AGE: [birth year] 48 y.o.  Admit date: (Not on file) Primary Physician: Assunta FoundGolding, John, MD  Reason for Consultation: Palpitations, murmur  HPI: Alexandra Shaffer Bohle is a 48 y.o. female who is being seen today for the evaluation of palpitations and cardiac murmur at the request of Assunta FoundGolding, John, MD.  Past medical history includes anxiety.  TSH was normal in July 2020.  She was evaluated in the ED on 06/20/2019 after she experienced chest discomfort after getting into an argument with her husband.  Troponin, CBC, and basic metabolic panel were all unremarkable.  Chest x-ray showed no active cardiopulmonary disease.  I personally reviewed the ECG which demonstrated sinus rhythm, 99 bpm.  She tells me that palpitations have been going on for the past 6 months.  They occur sporadically.  They have lasted up to a minute.  She denies exertional chest pain and dyspnea.  She denies leg swelling, orthopnea, and paroxysmal nocturnal dyspnea.  She said her baseline levels of anxiety are controlled.  She drinks one 20 ounce bottle of Anheuser-BuschMountain Dew daily.  She otherwise drinks water.  She has been having sporadic headaches over the last several months as well.   Allergies  Allergen Reactions  . Codeine Nausea And Vomiting  . Keflex [Cephalexin] Itching and Rash    Current Outpatient Medications  Medication Sig Dispense Refill  . acetaminophen (TYLENOL) 500 MG tablet Take 1,000 mg by mouth every 6 (six) hours as needed for mild pain or headache.     . ALPRAZolam (XANAX) 1 MG tablet Take 0.5-1 mg by mouth 2 (two) times daily as needed for anxiety.     Marland Kitchen. escitalopram (LEXAPRO) 20 MG tablet Take 20 mg by mouth daily.    Marland Kitchen. ibuprofen (ADVIL,MOTRIN) 200 MG tablet Take 600 mg by mouth 2 (two) times daily as needed for cramping.      No current facility-administered medications for this visit.     Past Medical History:   Diagnosis Date  . Anxiety   . Depression   . Hot flashes 11/26/2014  . Mood swings 11/26/2014    Past Surgical History:  Procedure Laterality Date  . CHOLECYSTECTOMY      Social History   Socioeconomic History  . Marital status: Married    Spouse name: Not on file  . Number of children: Not on file  . Years of education: Not on file  . Highest education level: Not on file  Occupational History  . Not on file  Social Needs  . Financial resource strain: Not on file  . Food insecurity    Worry: Not on file    Inability: Not on file  . Transportation needs    Medical: Not on file    Non-medical: Not on file  Tobacco Use  . Smoking status: Former Smoker    Types: Cigarettes  . Smokeless tobacco: Never Used  Substance and Sexual Activity  . Alcohol use: No  . Drug use: No  . Sexual activity: Yes    Birth control/protection: None    Comment: husband had vasectomy  Lifestyle  . Physical activity    Days per week: Not on file    Minutes per session: Not on file  . Stress: Not on file  Relationships  . Social Musicianconnections    Talks on phone: Not on file    Gets together: Not on file    Attends religious service: Not on  file    Active member of club or organization: Not on file    Attends meetings of clubs or organizations: Not on file    Relationship status: Not on file  . Intimate partner violence    Fear of current or ex partner: Not on file    Emotionally abused: Not on file    Physically abused: Not on file    Forced sexual activity: Not on file  Other Topics Concern  . Not on file  Social History Narrative  . Not on file     No family history of premature CAD in 1st degree relatives.  Current Meds  Medication Sig  . acetaminophen (TYLENOL) 500 MG tablet Take 1,000 mg by mouth every 6 (six) hours as needed for mild pain or headache.   . ALPRAZolam (XANAX) 1 MG tablet Take 0.5-1 mg by mouth 2 (two) times daily as needed for anxiety.   Marland Kitchen escitalopram  (LEXAPRO) 20 MG tablet Take 20 mg by mouth daily.  Marland Kitchen ibuprofen (ADVIL,MOTRIN) 200 MG tablet Take 600 mg by mouth 2 (two) times daily as needed for cramping.   . [DISCONTINUED] amoxicillin (AMOXIL) 500 MG capsule Take 1 capsule (500 mg total) by mouth 3 (three) times daily.  . [DISCONTINUED] benzonatate (TESSALON) 100 MG capsule Take 2 capsules (200 mg total) by mouth 3 (three) times daily as needed.  . [DISCONTINUED] mirabegron ER (MYRBETRIQ) 25 MG TB24 tablet Take 1 tablet (25 mg total) by mouth daily.      Review of systems complete and found to be negative unless listed above in HPI    Physical exam Blood pressure 130/84, pulse 85, temperature 98.3 F (36.8 C), height 5\' 10"  (1.778 m), SpO2 98 %. General: NAD Neck: No JVD, no thyromegaly or thyroid nodule.  Lungs: Clear to auscultation bilaterally with normal respiratory effort. CV: Nondisplaced PMI. Regular rate and rhythm, normal S1/S2, no Q6/S3, 2/6 systolic murmur along left sternal border.  No peripheral edema.  No carotid bruit.    Abdomen: Soft, nontender, no distention.  Skin: Intact without lesions or rashes.  Neurologic: Alert and oriented x 3.  Psych: Normal affect. Extremities: No clubbing or cyanosis.  HEENT: Normal.   ECG: Most recent ECG reviewed.   Labs: Lab Results  Component Value Date/Time   K 3.7 06/20/2019 08:26 PM   BUN 11 06/20/2019 08:26 PM   BUN 9 11/26/2014 02:37 PM   CREATININE 0.85 06/20/2019 08:26 PM   ALT 10 (L) 05/03/2017 10:33 AM   TSH 1.340 11/26/2014 02:37 PM   HGB 12.7 06/20/2019 08:26 PM     Lipids: Lab Results  Component Value Date/Time   LDLCALC 92 11/26/2014 02:37 PM   CHOL 165 11/26/2014 02:37 PM   TRIG 109 11/26/2014 02:37 PM   HDL 51 11/26/2014 02:37 PM        ASSESSMENT AND PLAN:   1.  Palpitations: Blood tests have been unremarkable.  ECG was unrevealing.  I will obtain a 1 week event monitor.  2.  Cardiac murmur: I will order a 2-D echocardiogram with Doppler to  evaluate cardiac structure, function, and regional wall motion.     Disposition: Follow up in 3 months  Signed: Kate Sable, M.D., F.A.C.C.  06/24/2019, 1:45 PM

## 2019-06-24 NOTE — Patient Instructions (Signed)
Medication Instructions: Your physician recommends that you continue on your current medications as directed. Please refer to the Current Medication list given to you today.  Labwork: None  Procedures/Testing: Your physician has requested that you have an echocardiogram. Echocardiography is a painless test that uses sound waves to create images of your heart. It provides your doctor with information about the size and shape of your heart and how well your heart's chambers and valves are working. This procedure takes approximately one hour. There are no restrictions for this procedure.  Your physician has recommended that you wear an event monitor for 1 week. Event monitors are medical devices that record the heart's electrical activity. Doctors most often [place] these monitors to diagnose arrhythmias. Arrhythmias are problems with the speed or rhythm of the heartbeat. The monitor is a small, portable device. You can wear one while you do your normal daily activities. This is usually used to diagnose what is causing palpitations/syncope (passing out).    Follow-Up: 3 months with Dr.Koneswaran  Any Additional Special Instructions Will Be Listed Below (If Applicable).     If you need a refill on your cardiac medications before your next appointment, please call your pharmacy.     Thank you for choosing [facility] !

## 2019-06-26 ENCOUNTER — Other Ambulatory Visit (HOSPITAL_COMMUNITY): Payer: PRIVATE HEALTH INSURANCE

## 2019-07-10 ENCOUNTER — Other Ambulatory Visit: Payer: Self-pay

## 2019-07-10 ENCOUNTER — Ambulatory Visit (HOSPITAL_COMMUNITY)
Admission: RE | Admit: 2019-07-10 | Discharge: 2019-07-10 | Disposition: A | Discharge status: Home / Self Care | Payer: PRIVATE HEALTH INSURANCE | Source: Ambulatory Visit | Attending: Cardiovascular Disease | Admitting: Cardiovascular Disease

## 2019-07-10 DIAGNOSIS — R002 Palpitations: Secondary | ICD-10-CM | POA: Diagnosis not present

## 2019-07-10 NOTE — Progress Notes (Signed)
*  PRELIMINARY RESULTS* Echocardiogram 2D Echocardiogram has been performed.  Alexandra Shaffer 07/10/2019, 3:47 PM

## 2019-07-11 ENCOUNTER — Ambulatory Visit (HOSPITAL_COMMUNITY): Payer: PRIVATE HEALTH INSURANCE

## 2019-08-28 ENCOUNTER — Encounter: Payer: Self-pay | Admitting: Adult Health

## 2019-08-28 ENCOUNTER — Ambulatory Visit: Payer: No Typology Code available for payment source | Admitting: Adult Health

## 2019-08-28 ENCOUNTER — Other Ambulatory Visit: Payer: Self-pay

## 2019-08-28 VITALS — BP 114/73 | HR 82 | Ht 70.0 in | Wt 330.4 lb

## 2019-08-28 DIAGNOSIS — B369 Superficial mycosis, unspecified: Secondary | ICD-10-CM | POA: Insufficient documentation

## 2019-08-28 DIAGNOSIS — R1032 Left lower quadrant pain: Secondary | ICD-10-CM

## 2019-08-28 DIAGNOSIS — R350 Frequency of micturition: Secondary | ICD-10-CM | POA: Insufficient documentation

## 2019-08-28 DIAGNOSIS — Z113 Encounter for screening for infections with a predominantly sexual mode of transmission: Secondary | ICD-10-CM | POA: Diagnosis not present

## 2019-08-28 LAB — POCT URINALYSIS DIPSTICK OB
Blood, UA: NEGATIVE
Glucose, UA: NEGATIVE
Ketones, UA: NEGATIVE
Leukocytes, UA: NEGATIVE
Nitrite, UA: NEGATIVE
POC,PROTEIN,UA: NEGATIVE

## 2019-08-28 MED ORDER — KETOROLAC TROMETHAMINE 10 MG PO TABS: 10.0000 mg | ORAL_TABLET | Freq: Four times a day (QID) | ORAL | 0 refills | Status: DC | PRN

## 2019-08-28 MED ORDER — FLUCONAZOLE 150 MG PO TABS: ORAL_TABLET | ORAL | 1 refills | Status: DC

## 2019-08-28 MED ORDER — NYSTATIN-TRIAMCINOLONE 100000-0.1 UNIT/GM-% EX OINT: 1.0000 "application " | TOPICAL_OINTMENT | Freq: Two times a day (BID) | CUTANEOUS | 1 refills | Status: DC

## 2019-08-28 NOTE — Progress Notes (Signed)
  Subjective:     Patient ID: Alexandra Shaffer, female   DOB: 23-Sep-1971, 48 y.o.   MRN: [id number]  HPI Alexandra Shaffer is a 48 year old white female, married, G2P2 in complaining of low pelvic pain, more to the left that started yesterday and urinary frequency.tylenol has not helped has history of ovarian cysts.Has pressure.  PCP is Dr Hilma Favors.  Review of Systems Pain in left side started yesterday,tylenol has not helped, feels like cyst on ovary +pressure  urinary frequency   Reviewed past medical,surgical, social and family history. Reviewed medications and allergies.      Objective:   Physical Exam BP 114/73 (BP Location: Left Wrist, Patient Position: Sitting, Cuff Size: Normal)   Pulse 82   Ht 5\' 10"  (1.778 m)   Wt (!) 330 lb 6.4 oz (149.9 kg)   LMP 08/07/2019 (Exact Date)   BMI 47.41 kg/m  urine dipstick was negative. Skin warm and dry. Lungs: clear to ausculation bilaterally. Cardiovascular: regular rate and rhythm. Pelvic: external genitalia is red on labia with several sebaceous cysts and angiokeratomas of [place], with redness in groin, vagina: scant discharge without odor,urethra has no lesions or masses noted, cervix:smooth and bulbous, uterus: normal size, shape and contour, non tender, no masses felt, adnexa: no masses, LLQ tenderness noted. Bladder is non tender and no masses felt. GC/CHL on urine Fall risk is low PHQ 2 score 1. Co exam with Weyman Croon FNP student.    Assessment:     1. Urinary frequency   2. LLQ pain   3. Superficial fungus infection of skin   4. Screening examination for STD (sexually transmitted disease)       Plan:     Return for GYN [place] in 1 week and see me Meds ordered this encounter  Medications  . fluconazole (DIFLUCAN) 150 MG tablet    Sig: Take 1 now and 1 in 3 days    Dispense:  2 tablet    Refill:  1    Order Specific Question:   Supervising Provider    Answer:   EURE, LUTHER H [[id number]]  . nystatin-triamcinolone ointment (MYCOLOG)   Sig: Apply 1 application topically 2 (two) times daily.    Dispense:  30 g    Refill:  1    Order Specific Question:   Supervising Provider    Answer:   EURE, LUTHER H [[id number]]  . ketorolac (TORADOL) 10 MG tablet    Sig: Take 1 tablet (10 mg total) by mouth every 6 (six) hours as needed.    Dispense:  20 tablet    Refill:  0    Order Specific Question:   Supervising Provider    Answer:   Tania Ade H [[id number]]  Try to keep dry  GC/CHL sent on urine If pain increase, or has fever or nausea and vomiting go to ER

## 2019-09-02 LAB — SPECIMEN STATUS REPORT

## 2019-09-02 LAB — GC/CHLAMYDIA PROBE AMP
Chlamydia trachomatis, NAA: NEGATIVE
Neisseria Gonorrhoeae by PCR: NEGATIVE

## 2019-09-03 ENCOUNTER — Telehealth: Payer: Self-pay | Admitting: Adult Health

## 2019-09-03 NOTE — Telephone Encounter (Signed)
Tried to reach the patient to remind her of her appointment/restrictions, mailbox not setup. °

## 2019-09-04 ENCOUNTER — Encounter: Payer: Self-pay | Admitting: Adult Health

## 2019-09-04 ENCOUNTER — Other Ambulatory Visit: Payer: Self-pay

## 2019-09-04 ENCOUNTER — Ambulatory Visit (INDEPENDENT_AMBULATORY_CARE_PROVIDER_SITE_OTHER): Payer: PRIVATE HEALTH INSURANCE | Admitting: Adult Health

## 2019-09-04 ENCOUNTER — Ambulatory Visit (INDEPENDENT_AMBULATORY_CARE_PROVIDER_SITE_OTHER): Payer: PRIVATE HEALTH INSURANCE

## 2019-09-04 VITALS — BP 123/73 | HR 75 | Ht 70.0 in | Wt 329.4 lb

## 2019-09-04 DIAGNOSIS — R1032 Left lower quadrant pain: Secondary | ICD-10-CM

## 2019-09-04 DIAGNOSIS — B369 Superficial mycosis, unspecified: Secondary | ICD-10-CM

## 2019-09-04 NOTE — Progress Notes (Signed)
PELVIC US TA/TV: homogeneous anteverted uterus,wnl,EEC 15.7 mm,normal ovaries,ovaries are best imaged on T/A images,left ovary was not visualized on T/V images,no free fluid,mult small nabothian cysts,left adnexal discomfort during ultrasound

## 2019-09-04 NOTE — Progress Notes (Signed)
  Subjective:     Patient ID: Alexandra Shaffer, female   DOB: September 05, 1971, 48 y.o.   MRN: [id number]  HPI Alexandra Shaffer is a 48 year old white female, married in for [place] to assess LLQ pain, which has resolved. PCP is Dr Hilma Favors   Review of Systems She says she feels much better, maybe a little sore Skin fungus is better too Reviewed past medical,surgical, social and family history. Reviewed medications and allergies.     Objective:   Physical Exam  BP 123/73 (BP Location: Left Arm, Patient Position: Sitting, Cuff Size: Normal)   Pulse 75   Ht 5\' 10"  (1.778 m)   Wt (!) 329 lb 6.4 oz (149.4 kg)   LMP 08/07/2019 (Exact Date)   BMI 47.26 kg/m   US showed normal uterus and ovaries, EEC 15.7 mm, but period is due     Assessment:     Skin fungus is resolving LLQ pain has resolved     Plan:    Use Mycolog prn  Return 12/8 at 3:50 pm for pap and physical with me

## 2019-09-30 ENCOUNTER — Other Ambulatory Visit: Payer: PRIVATE HEALTH INSURANCE | Admitting: Adult Health

## 2019-10-03 ENCOUNTER — Telehealth: Payer: Self-pay | Admitting: Cardiovascular Disease

## 2019-10-03 NOTE — Telephone Encounter (Signed)

## 2019-10-07 ENCOUNTER — Telehealth (INDEPENDENT_AMBULATORY_CARE_PROVIDER_SITE_OTHER): Payer: PRIVATE HEALTH INSURANCE | Admitting: Cardiovascular Disease

## 2019-10-07 ENCOUNTER — Encounter: Payer: Self-pay | Admitting: Cardiovascular Disease

## 2019-10-07 VITALS — Ht 70.0 in | Wt 329.0 lb

## 2019-10-07 DIAGNOSIS — Z712 Person consulting for explanation of examination or test findings: Secondary | ICD-10-CM | POA: Diagnosis not present

## 2019-10-07 DIAGNOSIS — R011 Cardiac murmur, unspecified: Secondary | ICD-10-CM | POA: Diagnosis not present

## 2019-10-07 DIAGNOSIS — R002 Palpitations: Secondary | ICD-10-CM | POA: Diagnosis not present

## 2019-10-07 NOTE — Progress Notes (Signed)
Virtual Visit via Telephone Note   This visit type was conducted due to national recommendations for restrictions regarding the COVID-19 Pandemic (e.g. social distancing) in an effort to limit this patient's exposure and mitigate transmission in our community.  Due to her co-morbid illnesses, this patient is at least at moderate risk for complications without adequate follow up.  This format is felt to be most appropriate for this patient at this time.  The patient did not have access to video technology/had technical difficulties with video requiring transitioning to audio format only (telephone).  All issues noted in this document were discussed and addressed.  No physical exam could be performed with this format.  Please refer to the patient's chart for her  consent to telehealth for [facility]   Date:  10/07/2019   ID:  Alexandra Shaffer, DOB 10-20-1971, MRN [id number]  Patient Location: Home Provider Location: Office  PCP:  Sharilyn Sites, MD  Cardiologist:  No primary care provider on file.  Electrophysiologist:  None   Evaluation Performed:  Follow-Up Visit  Chief Complaint: Follow-up of cardiac testing  History of Present Illness:    Alexandra Shaffer is a 48 y.o. female with palpitations and cardiac murmur.  I previously ordered an event monitor and an echocardiogram.  Echocardiogram demonstrated normal LV systolic function, EF 55 to 60%.  There was no LVH.  There were no significant valvular abnormalities.  She wore the event monitor but results are currently unavailable in Epic.  Anxiety levels are under much better control.  She has ironed things out with her husband whom she has been married to for over 25 years.  She denies chest pain, shortness of breath, and palpitations.  She now has a full-time job.   Past Medical History:  Diagnosis Date  . Anxiety   . Depression   . Hot flashes 11/26/2014  . Mood swings 11/26/2014   Past Surgical History:  Procedure  Laterality Date  . CHOLECYSTECTOMY       Current Meds  Medication Sig  . acetaminophen (TYLENOL) 500 MG tablet Take 1,000 mg by mouth every 6 (six) hours as needed for mild pain or headache.   . ALPRAZolam (XANAX) 1 MG tablet Take 0.5-1 mg by mouth 2 (two) times daily as needed for anxiety.   . DULoxetine (CYMBALTA) 60 MG capsule Take 60 mg by mouth daily.  Marland Kitchen ibuprofen (ADVIL,MOTRIN) 200 MG tablet Take 600 mg by mouth 2 (two) times daily as needed for cramping.   Marland Kitchen ketorolac (TORADOL) 10 MG tablet Take 1 tablet (10 mg total) by mouth every 6 (six) hours as needed.  . nystatin-triamcinolone ointment (MYCOLOG) Apply 1 application topically 2 (two) times daily.     Allergies:   Codeine and Keflex [cephalexin]   Social History   Tobacco Use  . Smoking status: Former Smoker    Types: Cigarettes  . Smokeless tobacco: Never Used  Substance Use Topics  . Alcohol use: No  . Drug use: No     Family Hx: The patient's family history includes Anxiety disorder in her mother and sister; Cancer in her maternal grandfather and maternal grandmother; Depression in her sister; Diabetes in her father, maternal grandmother, and paternal grandmother; Emphysema in her paternal grandmother; Heart disease in her maternal grandmother and paternal grandmother; Hyperlipidemia in her daughter; Hypertension in her father; Kidney disease in her maternal grandmother; Other in her father and paternal grandfather; Stroke in her father, maternal grandmother, and paternal grandmother.  ROS:   Please see  the history of present illness.     All other systems reviewed and are negative.   Prior CV studies:   The following studies were reviewed today:  Reviewed above  Labs/Other Tests and Data Reviewed:    EKG:  No ECG reviewed.  Recent Labs: 06/20/2019: BUN 11; Creatinine, Ser 0.85; Hemoglobin 12.7; Platelets 243; Potassium 3.7; Sodium 139   Recent Lipid Panel Lab Results  Component Value Date/Time   CHOL  165 11/26/2014 02:37 PM   TRIG 109 11/26/2014 02:37 PM   HDL 51 11/26/2014 02:37 PM   CHOLHDL 3.2 11/26/2014 02:37 PM   LDLCALC 92 11/26/2014 02:37 PM    Wt Readings from Last 3 Encounters:  10/07/19 (!) 329 lb (149.2 kg)  09/04/19 (!) 329 lb 6.4 oz (149.4 kg)  08/28/19 (!) 330 lb 6.4 oz (149.9 kg)     Objective:    Vital Signs:  Ht 5\' 10"  (1.778 m)   Wt (!) 329 lb (149.2 kg)   BMI 47.21 kg/m    VITAL SIGNS:  reviewed  ASSESSMENT & PLAN:    1.  Palpitations: I previously ordered an event monitor which she wore but results are unavailable in the electronic medical record.  I will look into this.  That being said, she has had no further episodes of palpitations and thinks she may have overreacted as she had been having issues with her husband at that time.  Since then her home situation is under much better control.  2.  Cardiac murmur: Echocardiogram was unremarkable with no significant valvular pathology and normal cardiac function.    COVID-19 Education: The signs and symptoms of COVID-19 were discussed with the patient and how to seek care for testing (follow up with PCP or arrange E-visit).  The importance of social distancing was discussed today.  Time:   Today, I have spent 10 minutes with the patient with telehealth technology discussing the above problems.     Medication Adjustments/Labs and Tests Ordered: Current medicines are reviewed at length with the patient today.  Concerns regarding medicines are outlined above.   Tests Ordered: No orders of the defined types were placed in this encounter.   Medication Changes: No orders of the defined types were placed in this encounter.   Follow Up:  Virtual Visit  prn  Signed, , MD  10/07/2019 1:25 PM    [facility]

## 2019-10-07 NOTE — Patient Instructions (Signed)
Medication Instructions:  Your physician recommends that you continue on your current medications as directed. Please refer to the Current Medication list given to you today.  *If you need a refill on your cardiac medications before your next appointment, please call your pharmacy*  Lab Work: None today If you have labs (blood work) drawn today and your tests are completely normal, you will receive your results only by: . MyChart Message (if you have MyChart) OR . A paper copy in the mail If you have any lab test that is abnormal or we need to change your treatment, we will call you to review the results.  Testing/Procedures: None today  Follow-Up: At [facility], you and your health needs are our priority.  As part of our continuing mission to provide you with exceptional heart care, we have created designated Provider Care Teams.  These Care Teams include your primary Cardiologist (physician) and Advanced Practice Providers (APPs -  Physician Assistants and Nurse Practitioners) who all work together to provide you with the care you need, when you need it.    Follow up as needed !     Thank you for choosing [facility] !        

## 2019-11-05 ENCOUNTER — Other Ambulatory Visit: Payer: PRIVATE HEALTH INSURANCE

## 2019-11-26 ENCOUNTER — Encounter: Payer: Self-pay | Admitting: Adult Health

## 2019-11-26 ENCOUNTER — Other Ambulatory Visit: Payer: Self-pay

## 2019-11-26 ENCOUNTER — Other Ambulatory Visit (HOSPITAL_COMMUNITY): Payer: Self-pay | Admitting: Adult Health

## 2019-11-26 ENCOUNTER — Telehealth: Payer: Self-pay | Admitting: Adult Health

## 2019-11-26 ENCOUNTER — Ambulatory Visit (INDEPENDENT_AMBULATORY_CARE_PROVIDER_SITE_OTHER): Payer: PRIVATE HEALTH INSURANCE | Admitting: Adult Health

## 2019-11-26 ENCOUNTER — Other Ambulatory Visit (HOSPITAL_COMMUNITY)
Admission: RE | Admit: 2019-11-26 | Discharge: 2019-11-26 | Disposition: A | Discharge status: Home / Self Care | Payer: PRIVATE HEALTH INSURANCE | Source: Ambulatory Visit | Attending: Adult Health | Admitting: Adult Health

## 2019-11-26 VITALS — BP 142/82 | HR 82 | Ht 70.0 in | Wt 331.2 lb

## 2019-11-26 DIAGNOSIS — Z1212 Encounter for screening for malignant neoplasm of rectum: Secondary | ICD-10-CM

## 2019-11-26 DIAGNOSIS — Z1231 Encounter for screening mammogram for malignant neoplasm of breast: Secondary | ICD-10-CM

## 2019-11-26 DIAGNOSIS — Z1211 Encounter for screening for malignant neoplasm of colon: Secondary | ICD-10-CM | POA: Diagnosis not present

## 2019-11-26 DIAGNOSIS — N926 Irregular menstruation, unspecified: Secondary | ICD-10-CM | POA: Insufficient documentation

## 2019-11-26 DIAGNOSIS — N951 Menopausal and female climacteric states: Secondary | ICD-10-CM

## 2019-11-26 DIAGNOSIS — R61 Generalized hyperhidrosis: Secondary | ICD-10-CM | POA: Insufficient documentation

## 2019-11-26 DIAGNOSIS — Z01419 Encounter for gynecological examination (general) (routine) without abnormal findings: Secondary | ICD-10-CM | POA: Diagnosis not present

## 2019-11-26 LAB — HEMOCCULT GUIAC POC 1CARD (OFFICE): Fecal Occult Blood, POC: NEGATIVE

## 2019-11-26 NOTE — Telephone Encounter (Signed)
Tried to reach the patient to remind her of her appointment/restrictions, mb not set up.

## 2019-11-26 NOTE — Telephone Encounter (Signed)

## 2019-11-26 NOTE — Progress Notes (Signed)
Patient ID: Alexandra Shaffer, female   DOB: February 01, 1971, 49 y.o.   MRN: [id number] History of Present Illness: Alexandra Shaffer is a 49 year old white female, married, G2P2, in for a well woman gyn exam and pap.She has not had period since November, and has night sweats and some urge incontinence.  PCP is Dr Phillips Odor.    Current Medications, Allergies, Past Medical History, Past Surgical History, Family History and Social History were reviewed in [employer] record.     Review of Systems: Patient denies any headaches, hearing loss, fatigue, blurred vision, shortness of breath, chest pain, abdominal pain, problems with bowel movements, urination, or intercourse. No joint pain or mood swings. See HPI for positives.   Physical Exam:BP (!) 142/82 (BP Location: Left Wrist, Patient Position: Sitting, Cuff Size: Normal)   Pulse 82   Ht 5\' 10"  (1.778 m)   Wt (!) 331 lb 3.2 oz (150.2 kg)   LMP 09/07/2019 (Exact Date)   BMI 47.52 kg/m  General:  Well developed, well nourished, no acute distress Skin:  Warm and dry Neck:  Midline trachea, normal thyroid, good ROM, no lymphadenopathy Lungs; Clear to auscultation bilaterally Breast:  No dominant palpable mass, retraction, or nipple discharge Cardiovascular: Regular rate and rhythm Abdomen:  Soft, non tender, no hepatosplenomegaly Pelvic:  External genitalia is normal in appearance, no lesions.  The vagina is normal in appearance. Urethra has no lesions or masses. The cervix is bulbous.Pap with high risk HPV 16/18 genotyping performed  Uterus is felt to be normal size, shape, and contour.  No adnexal masses or tenderness noted.Bladder is non tender, no masses felt. Rectal: Good sphincter tone, no polyps, or hemorrhoids felt.  Hemoccult negative. Extremities/musculoskeletal:  No swelling or varicosities noted, no clubbing or cyanosis Psych:  No mood changes, alert and cooperative,seems happy Fall risk is low PHQ 2 score 0. Examination  chaperoned by 09/09/2019 LPN.  Impression and Plan:  1. Encounter for gynecological examination with Papanicolaou smear of cervix Pap sent Physical in 1 year  Pap in 3 if normal Labs with PCP Get mammogram   2. Screening for colorectal cancer Colonoscopy at 52  3. Peri-menopause Review handout on perimenopause and menopause Discussed symptoms and could try HRT if gets worse   4. Night sweats  5. Missed periods

## 2019-11-26 NOTE — Patient Instructions (Signed)
Menopause Menopause is the normal time of life when menstrual periods stop completely. It is usually confirmed by 12 months without a menstrual period. The transition to menopause (perimenopause) most often happens between the ages of 45 and 55. During perimenopause, hormone levels change in your body, which can cause symptoms and affect your health. Menopause may increase your risk for:  Loss of bone (osteoporosis), which causes bone breaks (fractures).  Depression.  Hardening and narrowing of the arteries (atherosclerosis), which can cause heart attacks and strokes. What are the causes? This condition is usually caused by a natural change in hormone levels that happens as you get older. The condition may also be caused by surgery to remove both ovaries (bilateral oophorectomy). What increases the risk? This condition is more likely to start at an earlier age if you have certain medical conditions or treatments, including:  A tumor of the pituitary gland in the brain.  A disease that affects the ovaries and hormone production.  Radiation treatment for cancer.  Certain cancer treatments, such as chemotherapy or hormone (anti-estrogen) therapy.  Heavy smoking and excessive alcohol use.  Family history of early menopause. This condition is also more likely to develop earlier in women who are very thin. What are the signs or symptoms? Symptoms of this condition include:  Hot flashes.  Irregular menstrual periods.  Night sweats.  Changes in feelings about sex. This could be a decrease in sex drive or an increased comfort around your sexuality.  Vaginal dryness and thinning of the vaginal walls. This may cause painful intercourse.  Dryness of the skin and development of wrinkles.  Headaches.  Problems sleeping (insomnia).  Mood swings or irritability.  Memory problems.  Weight gain.  Hair growth on the face and chest.  Bladder infections or problems with urinating. How  is this diagnosed? This condition is diagnosed based on your medical history, a physical exam, your age, your menstrual history, and your symptoms. Hormone tests may also be done. How is this treated? In some cases, no treatment is needed. You and your health care provider should make a decision together about whether treatment is necessary. Treatment will be based on your individual condition and preferences. Treatment for this condition focuses on managing symptoms. Treatment may include:  Menopausal hormone therapy (MHT).  Medicines to treat specific symptoms or complications.  Acupuncture.  Vitamin or herbal supplements. Before starting treatment, make sure to let your health care provider know if you have a personal or family history of:  Heart disease.  Breast cancer.  Blood clots.  Diabetes.  Osteoporosis. Follow these instructions at home: Lifestyle  Do not use any products that contain nicotine or tobacco, such as cigarettes and e-cigarettes. If you need help quitting, ask your health care provider.  Get at least 30 minutes of physical activity on 5 or more days each week.  Avoid alcoholic and caffeinated beverages, as well as spicy foods. This may help prevent hot flashes.  Get 7-8 hours of sleep each night.  If you have hot flashes, try: ? Dressing in layers. ? Avoiding things that may trigger hot flashes, such as spicy food, warm places, or stress. ? Taking slow, deep breaths when a hot flash starts. ? Keeping a fan in your home and office.  Find ways to manage stress, such as deep breathing, meditation, or journaling.  Consider going to group therapy with other women who are having menopause symptoms. Ask your health care provider about recommended group therapy meetings. Eating and   drinking  Eat a healthy, balanced diet that contains whole grains, lean protein, low-fat dairy, and plenty of fruits and vegetables.  Your health care provider may recommend  adding more soy to your diet. Foods that contain soy include tofu, tempeh, and soy milk.  Eat plenty of foods that contain calcium and vitamin D for bone health. Items that are rich in calcium include low-fat milk, yogurt, beans, almonds, sardines, broccoli, and kale. Medicines  Take over-the-counter and prescription medicines only as told by your health care provider.  Talk with your health care provider before starting any herbal supplements. If prescribed, take vitamins and supplements as told by your health care provider. These may include: ? Calcium. Women age 51 and older should get 1,200 mg (milligrams) of calcium every day. ? Vitamin D. Women need 600-800 International Units of vitamin D each day. ? Vitamins B12 and B6. Aim for 50 micrograms of B12 and 1.5 mg of B6 each day. General instructions  Keep track of your menstrual periods, including: ? When they occur. ? How heavy they are and how long they last. ? How much time passes between periods.  Keep track of your symptoms, noting when they start, how often you have them, and how long they last.  Use vaginal lubricants or moisturizers to help with vaginal dryness and improve comfort during sex.  Keep all follow-up visits as told by your health care provider. This is important. This includes any group therapy or counseling. Contact a health care provider if:  You are still having menstrual periods after age 55.  You have pain during sex.  You have not had a period for 12 months and you develop vaginal bleeding. Get help right away if:  You have: ? Severe depression. ? Excessive vaginal bleeding. ? Pain when you urinate. ? A fast or irregular heart beat (palpitations). ? Severe headaches. ? Abdomen (abdominal) pain or severe indigestion.  You fell and you think you have a broken bone.  You develop leg or chest pain.  You develop vision problems.  You feel a lump in your breast. Summary  Menopause is the normal  time of life when menstrual periods stop completely. It is usually confirmed by 12 months without a menstrual period.  The transition to menopause (perimenopause) most often happens between the ages of 45 and 55.  Symptoms can be managed through medicines, lifestyle changes, and complementary therapies such as acupuncture.  Eat a balanced diet that is rich in nutrients to promote bone health and heart health and to manage symptoms during menopause. This information is not intended to replace advice given to you by your health care provider. Make sure you discuss any questions you have with your health care provider. Document Revised: 09/21/2017 Document Reviewed: 11/11/2016 Elsevier Patient Education  2020 [employer] Perimenopause  Perimenopause is the normal time of life before and after menstrual periods stop completely (menopause). Perimenopause can begin 2-8 years before menopause, and it usually lasts for 1 year after menopause. During perimenopause, the ovaries may or may not produce an egg. What are the causes? This condition is caused by a natural change in hormone levels that happens as you get older. What increases the risk? This condition is more likely to start at an earlier age if you have certain medical conditions or treatments, including:  A tumor of the pituitary gland in the brain.  A disease that affects the ovaries and hormone production.  Radiation treatment for cancer.  Certain cancer treatments,   such as chemotherapy or hormone (anti-estrogen) therapy.  Heavy smoking and excessive alcohol use.  Family history of early menopause. What are the signs or symptoms? Perimenopausal changes affect each woman differently. Symptoms of this condition may include:  Hot flashes.  Night sweats.  Irregular menstrual periods.  Decreased sex drive.  Vaginal dryness.  Headaches.  Mood swings.  Depression.  Memory problems or trouble  concentrating.  Irritability.  Tiredness.  Weight gain.  Anxiety.  Trouble getting pregnant. How is this diagnosed? This condition is diagnosed based on your medical history, a physical exam, your age, your menstrual history, and your symptoms. Hormone tests may also be done. How is this treated? In some cases, no treatment is needed. You and your health care provider should make a decision together about whether treatment is necessary. Treatment will be based on your individual condition and preferences. Various treatments are available, such as:  Menopausal hormone therapy (MHT).  Medicines to treat specific symptoms.  Acupuncture.  Vitamin or herbal supplements. Before starting treatment, make sure to let your health care provider know if you have a personal or family history of:  Heart disease.  Breast cancer.  Blood clots.  Diabetes.  Osteoporosis. Follow these instructions at home: Lifestyle  Do not use any products that contain nicotine or tobacco, such as cigarettes and e-cigarettes. If you need help quitting, ask your health care provider.  Eat a balanced diet that includes fresh fruits and vegetables, whole grains, soybeans, eggs, lean meat, and low-fat dairy.  Get at least 30 minutes of physical activity on 5 or more days each week.  Avoid alcoholic and caffeinated beverages, as well as spicy foods. This may help prevent hot flashes.  Get 7-8 hours of sleep each night.  Dress in layers that can be removed to help you manage hot flashes.  Find ways to manage stress, such as deep breathing, meditation, or journaling. General instructions  Keep track of your menstrual periods, including: ? When they occur. ? How heavy they are and how long they last. ? How much time passes between periods.  Keep track of your symptoms, noting when they start, how often you have them, and how long they last.  Take over-the-counter and prescription medicines only as  told by your health care provider.  Take vitamin supplements only as told by your health care provider. These may include calcium, vitamin E, and vitamin D.  Use vaginal lubricants or moisturizers to help with vaginal dryness and improve comfort during sex.  Talk with your health care provider before starting any herbal supplements.  Keep all follow-up visits as told by your health care provider. This is important. This includes any group therapy or counseling. Contact a health care provider if:  You have heavy vaginal bleeding or pass blood clots.  Your period lasts more than 2 days longer than normal.  Your periods are recurring sooner than 21 days.  You bleed after having sex. Get help right away if:  You have chest pain, trouble breathing, or trouble talking.  You have severe depression.  You have pain when you urinate.  You have severe headaches.  You have vision problems. Summary  Perimenopause is the time when a woman's body begins to move into menopause. This may happen naturally or as a result of other health problems or medical treatments.  Perimenopause can begin 2-8 years before menopause, and it usually lasts for 1 year after menopause.  Perimenopausal symptoms can be managed through medicines, lifestyle changes,   and complementary therapies such as acupuncture. This information is not intended to replace advice given to you by your health care provider. Make sure you discuss any questions you have with your health care provider. Document Revised: 09/21/2017 Document Reviewed: 11/14/2016 Elsevier Patient Education  2020 Elsevier Inc.  

## 2019-11-27 ENCOUNTER — Other Ambulatory Visit: Payer: PRIVATE HEALTH INSURANCE | Admitting: Adult Health

## 2019-11-28 LAB — CYTOLOGY - PAP
Comment: NEGATIVE
Diagnosis: NEGATIVE
High risk HPV: NEGATIVE

## 2019-12-01 ENCOUNTER — Ambulatory Visit (HOSPITAL_COMMUNITY): Payer: PRIVATE HEALTH INSURANCE

## 2019-12-08 ENCOUNTER — Ambulatory Visit (HOSPITAL_COMMUNITY)
Admission: RE | Admit: 2019-12-08 | Discharge: 2019-12-08 | Disposition: A | Discharge status: Home / Self Care | Payer: PRIVATE HEALTH INSURANCE | Source: Ambulatory Visit | Attending: Adult Health | Admitting: Adult Health

## 2019-12-08 ENCOUNTER — Encounter (HOSPITAL_COMMUNITY): Payer: Self-pay

## 2019-12-08 ENCOUNTER — Other Ambulatory Visit: Payer: Self-pay

## 2019-12-08 DIAGNOSIS — Z1231 Encounter for screening mammogram for malignant neoplasm of breast: Secondary | ICD-10-CM | POA: Diagnosis present

## 2019-12-08 IMAGING — MG DIGITAL SCREENING BILAT W/ TOMO W/ CAD
8 series · 8 of 24 positions shown · non-contrast
Comparison: None

CLINICAL DATA: Screening.

EXAM:
DIGITAL SCREENING BILATERAL MAMMOGRAM WITH TOMO AND CAD

[R CC synth-2D]
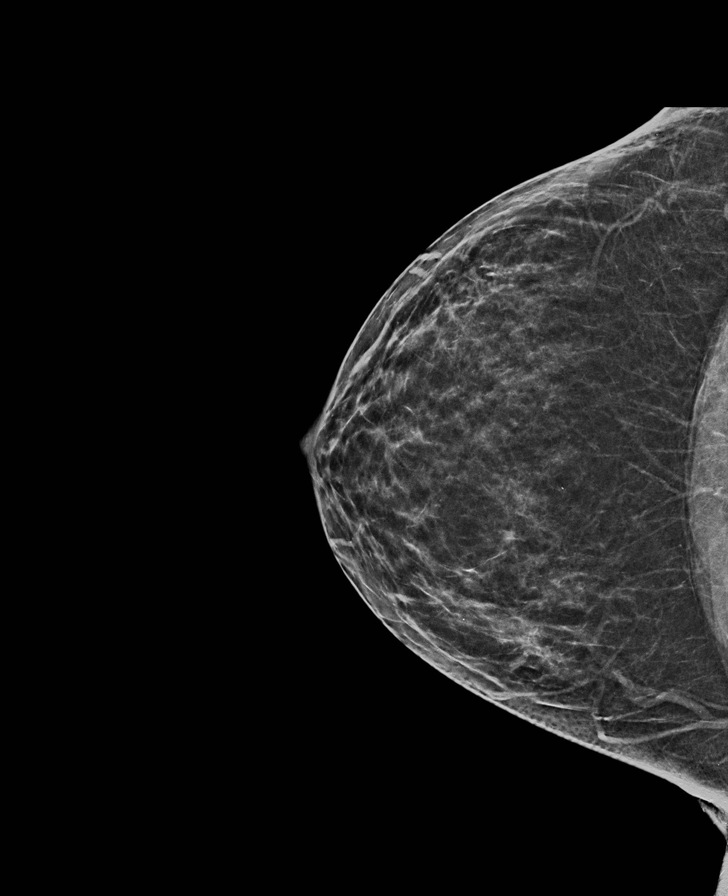

[L MLO synth-2D]
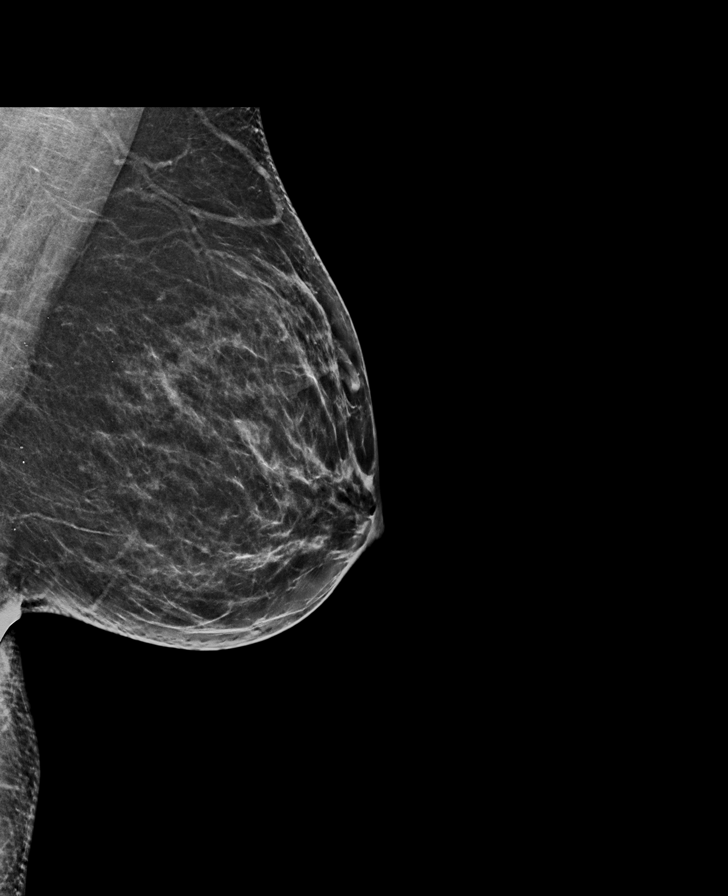

[L CC synth-2D]
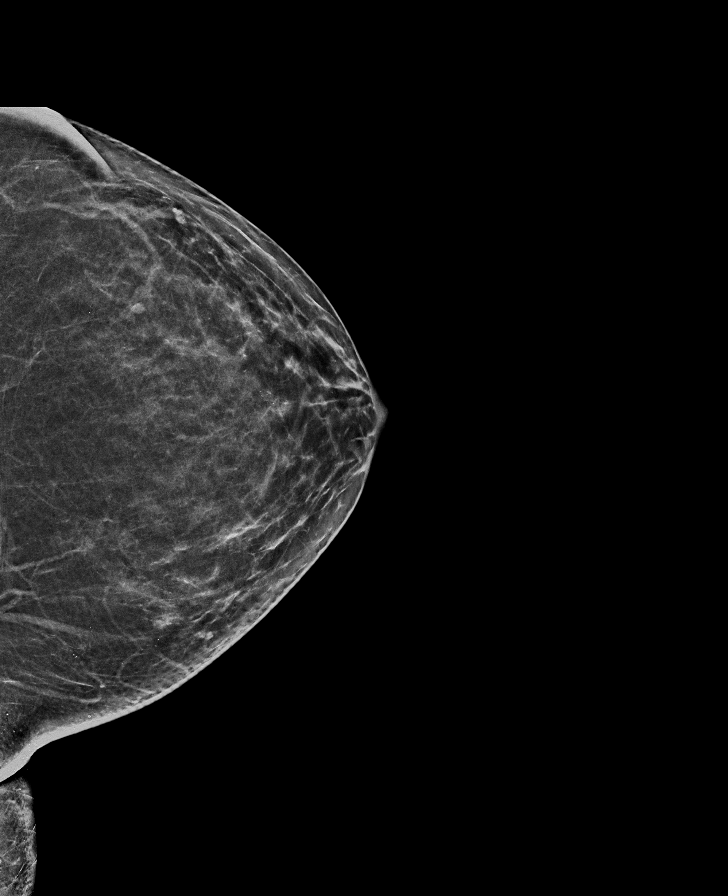

[R MLO synth-2D]
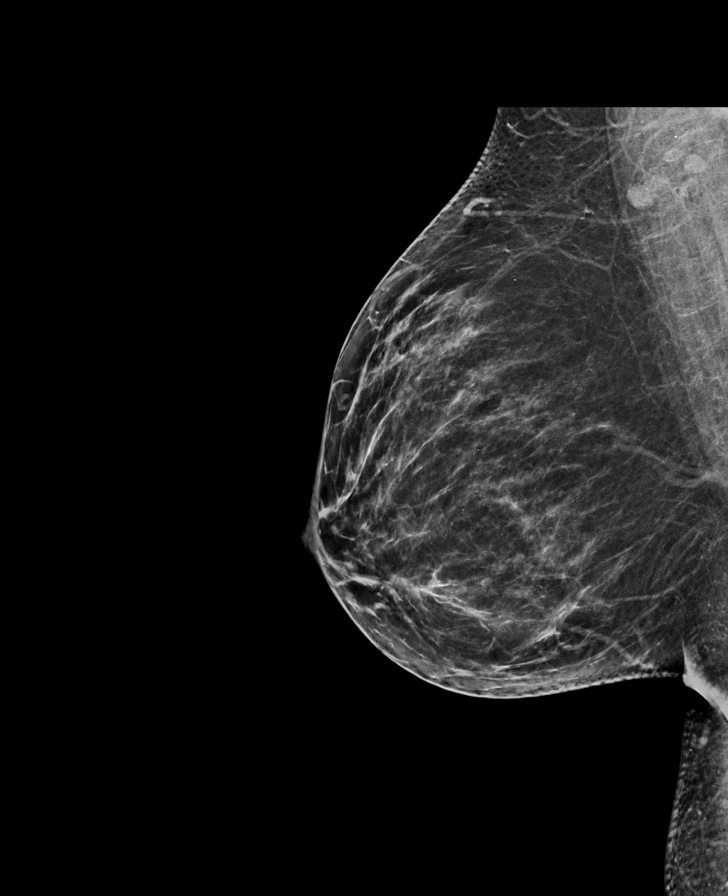

[L CC tomo · tomo slice 32/63.0]
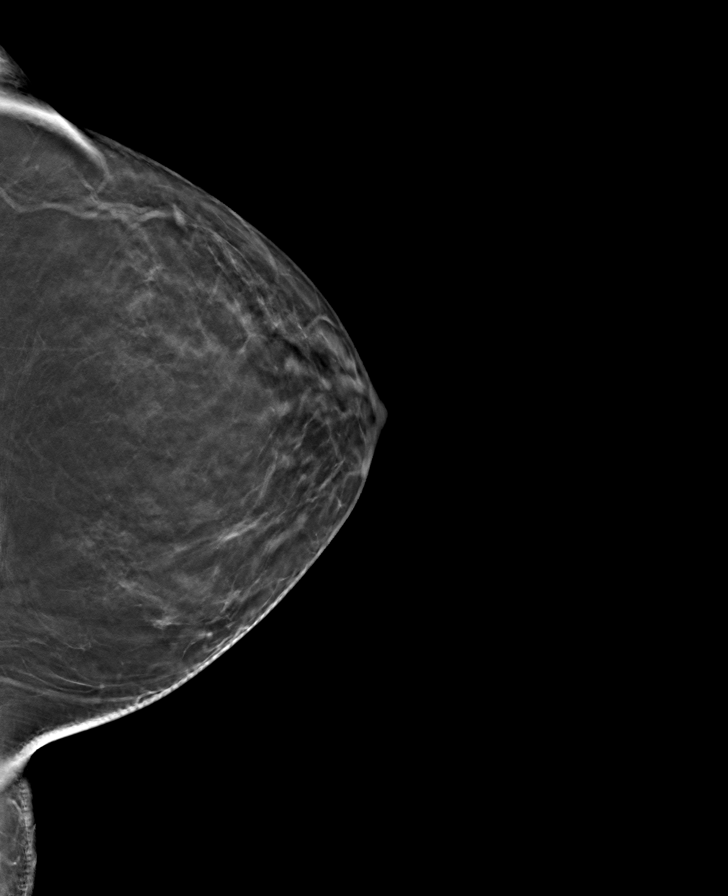

[R MLO tomo · tomo slice 34/67.0]
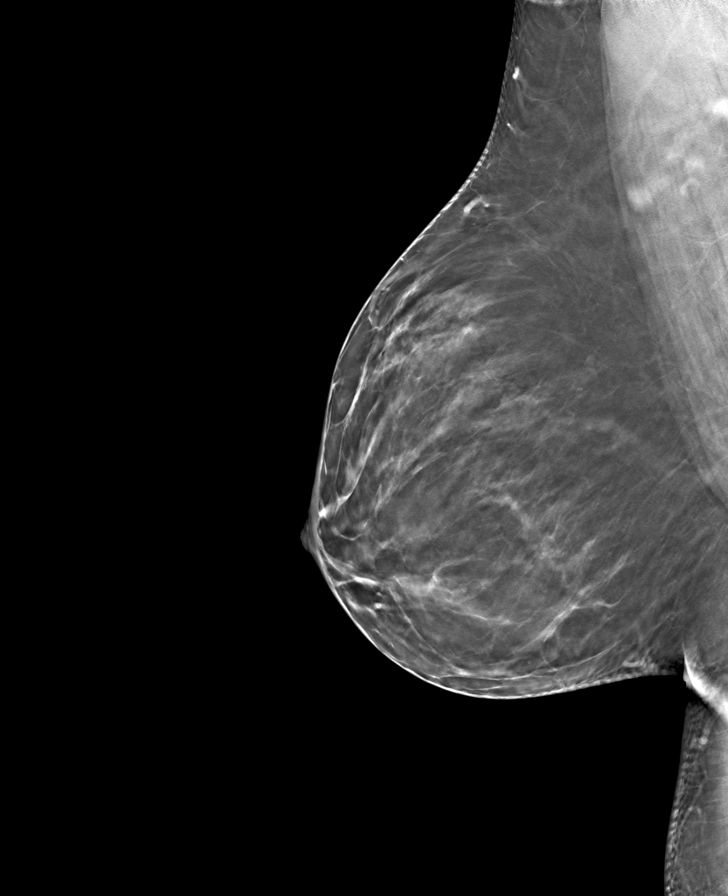

[R CC tomo · tomo slice 31/62.0]
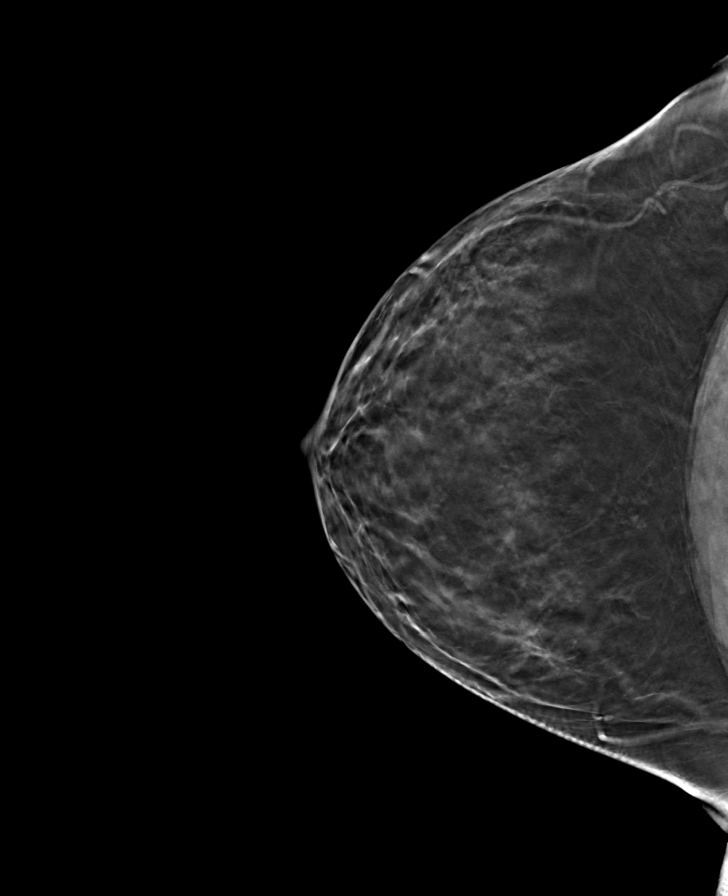

[L MLO tomo · tomo slice 35/68.0]
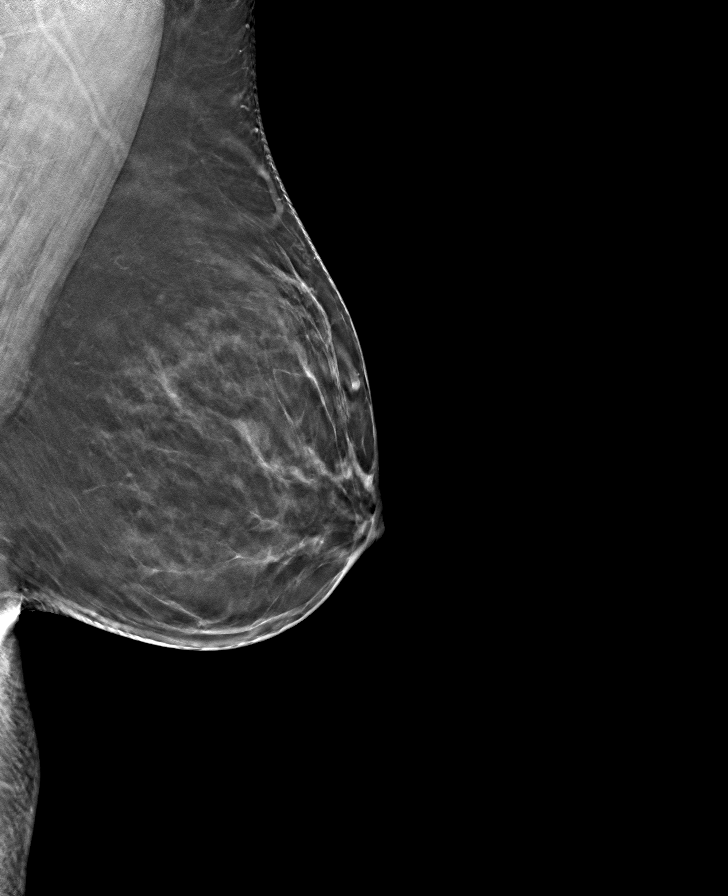

[8 of 24 positions shown; findings below may reference images not displayed]

ACR Breast Density Category b: There are scattered areas of
fibroglandular density.
FINDINGS: In the right breast an asymmetry requires further evaluation.

In the left breast a group of calcifications and a possible mass
require further evaluation.

Images were processed with CAD.
IMPRESSION: Further evaluation is suggested for possible asymmetry in the right
breast.

Further evaluation is suggested for possible calcifications and a
possible mass in the left breast.

RECOMMENDATION:
Diagnostic mammogram and possibly ultrasound of both breasts.
(Code:[H3])

The patient will be contacted regarding the findings, and additional
imaging will be scheduled.

BI-RADS CATEGORY  0: Incomplete. Need additional imaging evaluation
and/or prior mammograms for comparison.

## 2019-12-09 ENCOUNTER — Other Ambulatory Visit (HOSPITAL_COMMUNITY): Payer: Self-pay | Admitting: Adult Health

## 2019-12-09 DIAGNOSIS — R928 Other abnormal and inconclusive findings on diagnostic imaging of breast: Secondary | ICD-10-CM

## 2019-12-16 ENCOUNTER — Ambulatory Visit (HOSPITAL_COMMUNITY)
Admission: RE | Admit: 2019-12-16 | Discharge: 2019-12-16 | Disposition: A | Discharge status: Home / Self Care | Payer: PRIVATE HEALTH INSURANCE | Source: Ambulatory Visit | Attending: Adult Health | Admitting: Adult Health

## 2019-12-16 ENCOUNTER — Other Ambulatory Visit: Payer: Self-pay

## 2019-12-16 ENCOUNTER — Ambulatory Visit (HOSPITAL_COMMUNITY): Payer: PRIVATE HEALTH INSURANCE

## 2019-12-16 DIAGNOSIS — R928 Other abnormal and inconclusive findings on diagnostic imaging of breast: Secondary | ICD-10-CM | POA: Insufficient documentation

## 2019-12-16 IMAGING — MG DIGITAL DIAGNOSTIC BILAT W/ TOMO W/ CAD
6 series · 6 of 14 positions shown · non-contrast
Comparison: Baseline screening mammogram [DATE]

CLINICAL DATA: 48-year-old patient recalled from recent baseline
screening mammogram for evaluation of a possible asymmetry in the
right breast and possible calcifications and possible mass in the
left breast.

EXAM:
DIGITAL DIAGNOSTIC BILATERAL MAMMOGRAM WITH CAD AND TOMO
ULTRASOUND LEFT BREAST

[L ML]
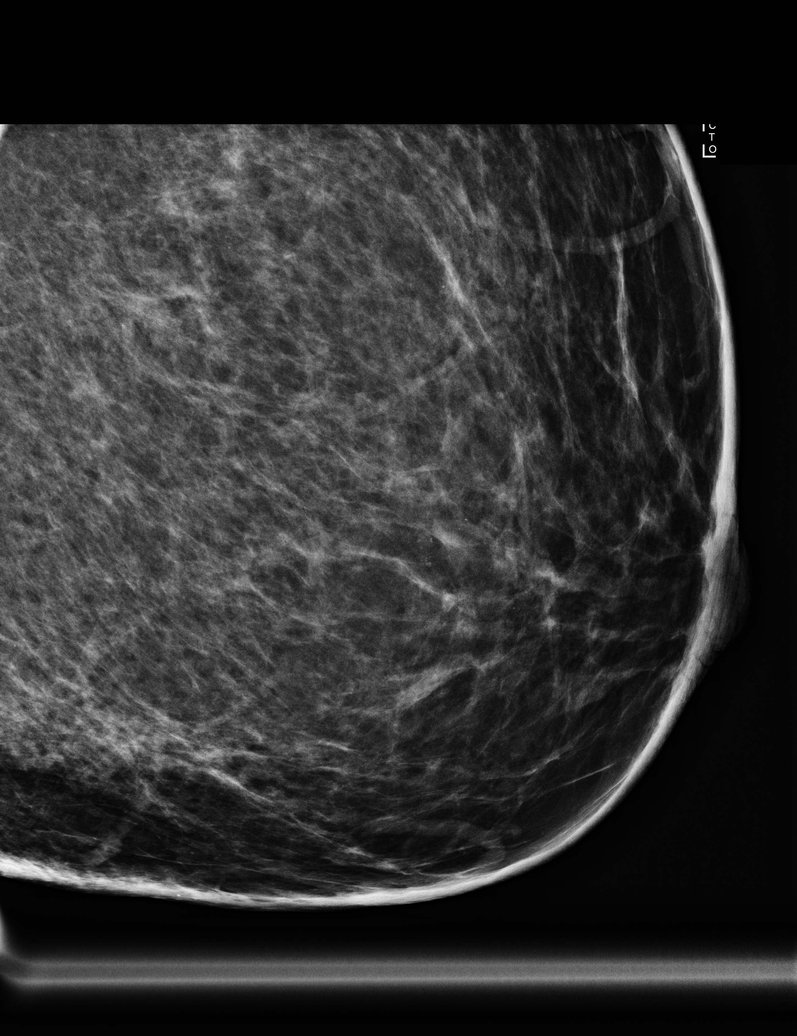

[L CC]
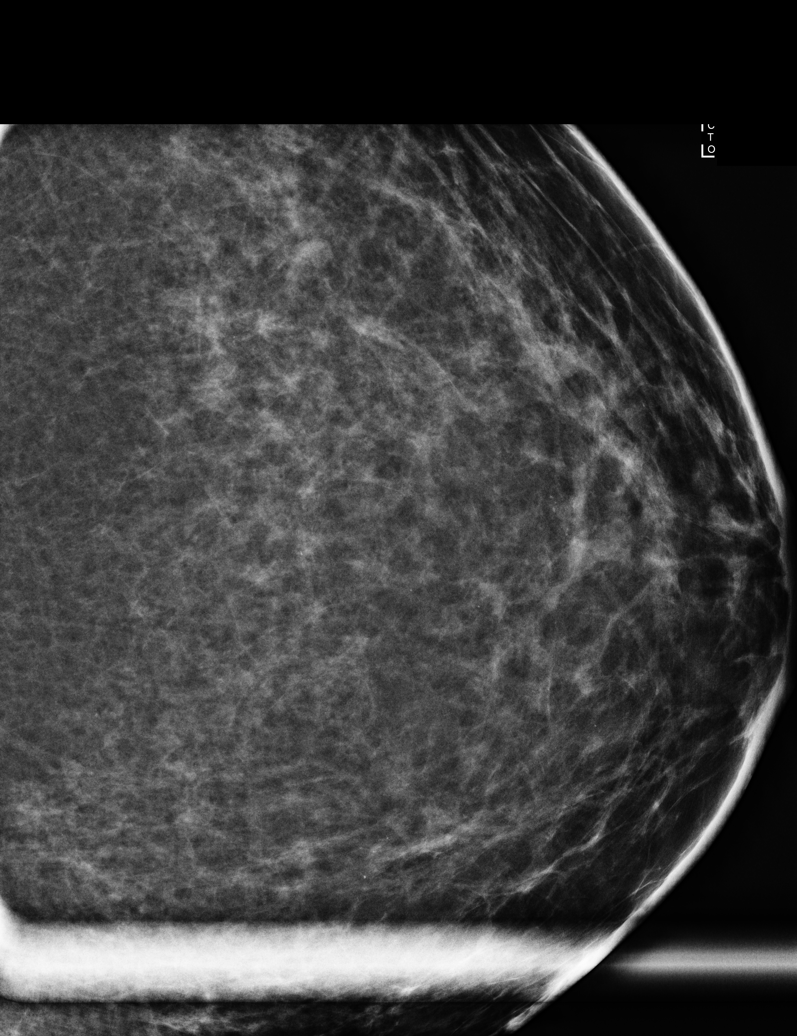

[R CC synth-2D]
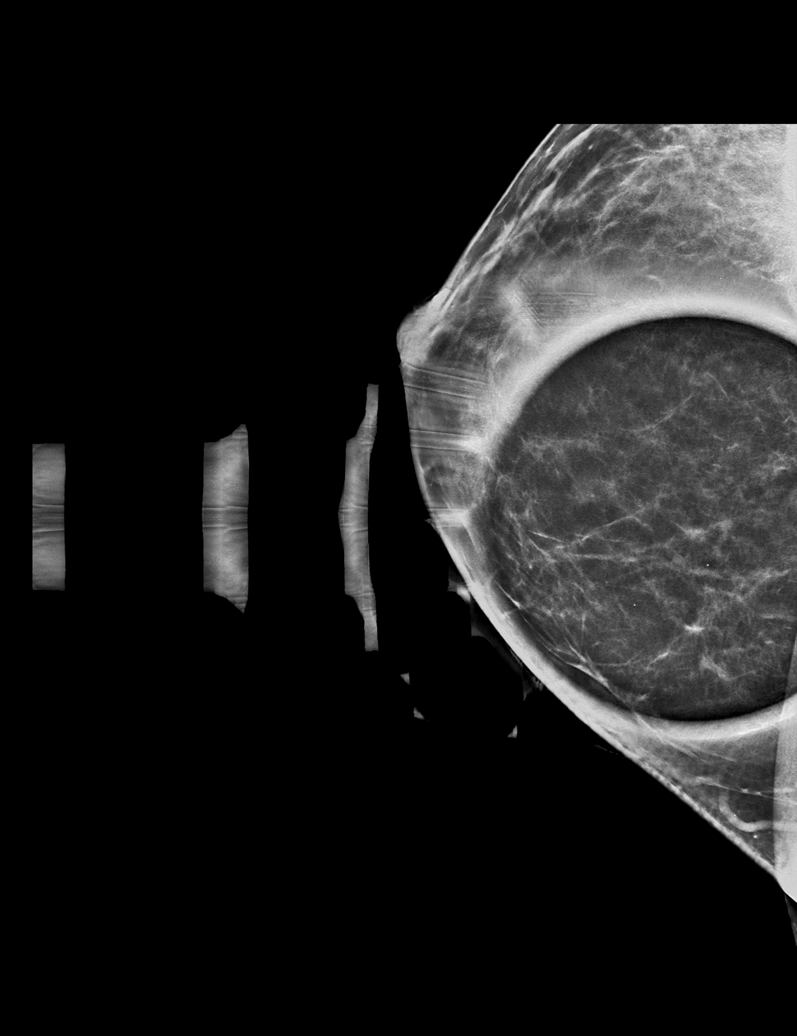

[R ML synth-2D]
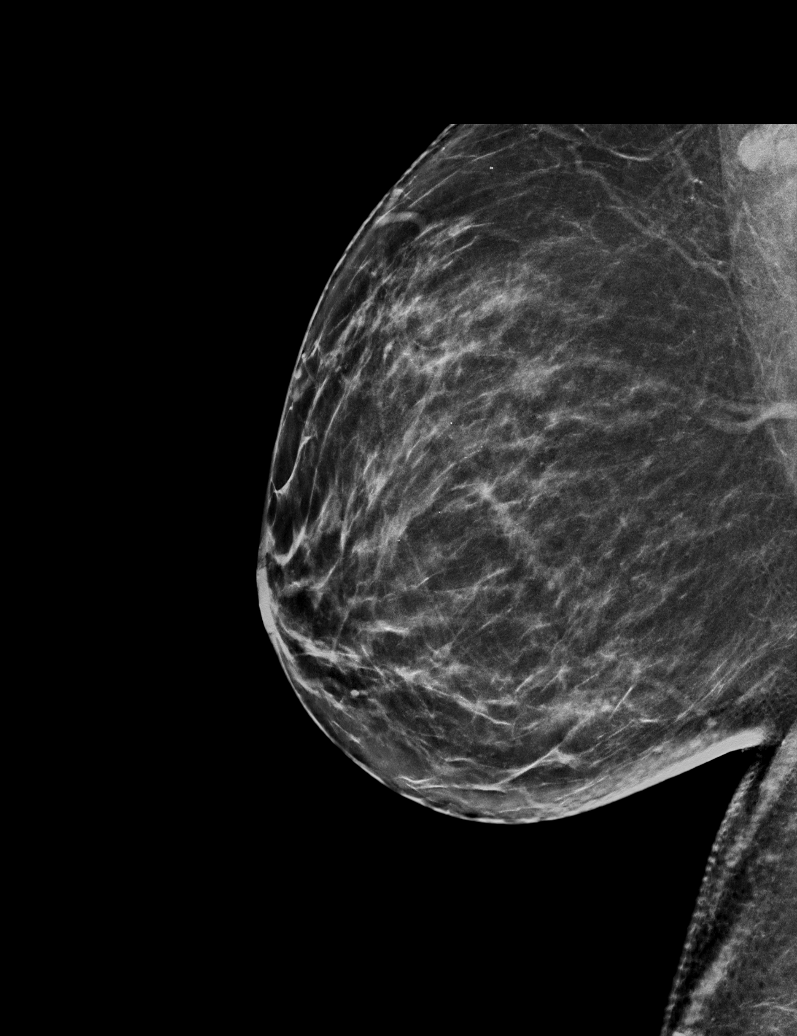

[R CC tomo · tomo slice 29/56.0]
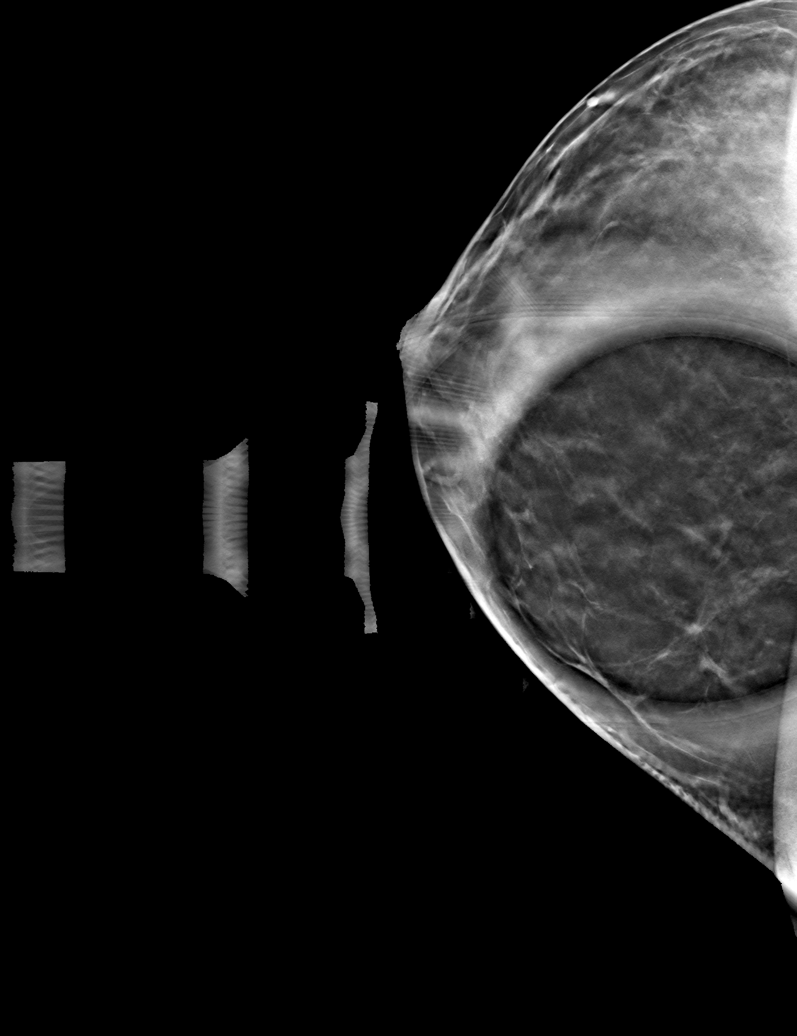

[R ML tomo · tomo slice 35/70.0]
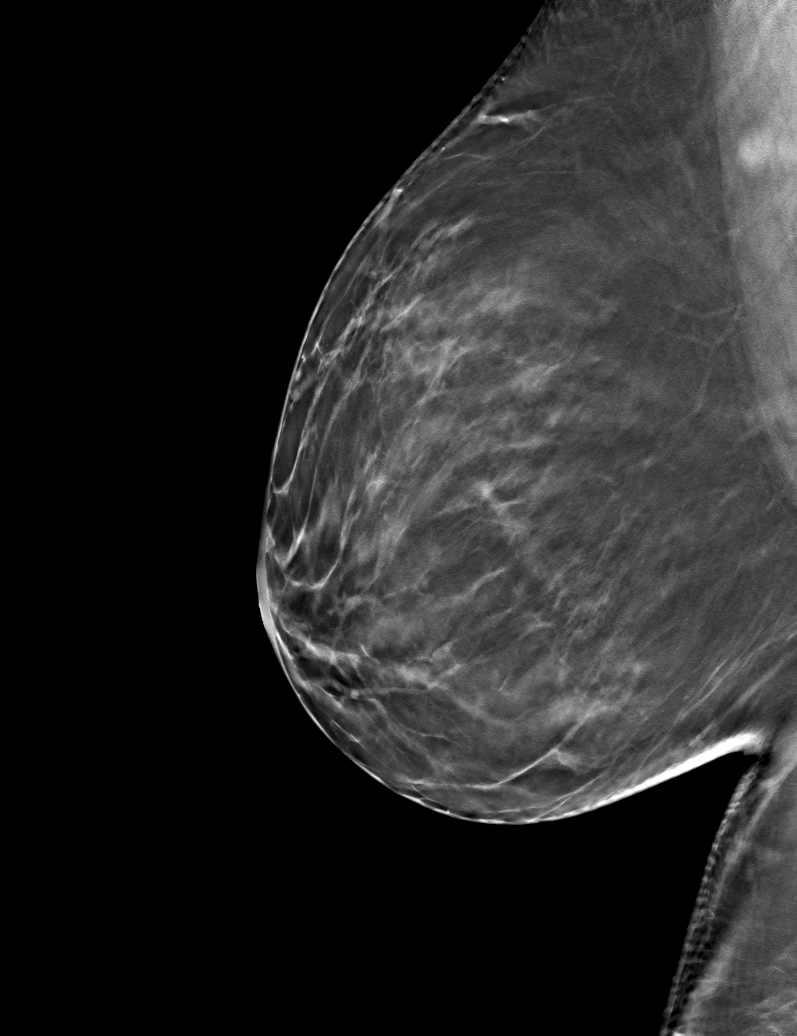

[6 of 14 positions shown; findings below may reference images not displayed]

ACR Breast Density Category b: There are scattered areas of
fibroglandular density.
FINDINGS: Magnification views of the retroareolar left breast show a group of
faint calcifications spanning 6 mm. These are smudgy in the CC
projection, and show a differential appearance with some of them
layering in the 90 degree lateral view. These have a benign
appearance suggestive of milk of calcium. No suspicious
microcalcifications.

In the outer left breast, the mass identified on recent screening
mammogram persists on the magnification view, and will be evaluated
with ultrasound. The mass measures approximately 4 mm.

Spot compression view of the medial right breast, middle third,
shows no persistent asymmetry. Normal fibroglandular densities are
identified in this region of the right breast. A 90 degree lateral
view of the right breast is negative.

Mammographic images were processed with CAD.

Targeted ultrasound is performed, showing a benign intramammary
lymph node in the 4 o'clock axis of left breast. It measures 5 x 4 x
3 mm and corresponds to the circumscribed oval mass seen in this
region of the left breast on the screening mammogram. No suspicious
findings in the lower outer quadrant of the left breast on
ultrasound.
IMPRESSION: 1. The possible asymmetry identified in the medial right breast
effaces with spot compression, with no persistent abnormality. No
evidence of malignancy in the right breast.
2. Benign calcifications retroareolar left breast consistent with
milk of calcium.
3. Benign intramammary lymph node 4 o'clock position left breast. No
evidence of malignancy in the left breast.

RECOMMENDATION:
Screening mammogram in one year.(Code:[1N])

I have discussed the findings and recommendations with the patient.
If applicable, a reminder letter will be sent to the patient
regarding the next appointment.

BI-RADS CATEGORY  2: Benign.

## 2020-03-26 ENCOUNTER — Other Ambulatory Visit: Payer: Self-pay

## 2020-03-26 ENCOUNTER — Ambulatory Visit
Admission: EM | Admit: 2020-03-26 | Discharge: 2020-03-26 | Disposition: A | Discharge status: Home / Self Care | Payer: PRIVATE HEALTH INSURANCE | Attending: Emergency Medicine | Admitting: Emergency Medicine

## 2020-03-26 ENCOUNTER — Encounter: Payer: Self-pay | Admitting: Emergency Medicine

## 2020-03-26 DIAGNOSIS — H1011 Acute atopic conjunctivitis, right eye: Secondary | ICD-10-CM | POA: Diagnosis not present

## 2020-03-26 MED ORDER — OLOPATADINE HCL 0.1 % OP SOLN: 1.0000 [drp] | Freq: Two times a day (BID) | OPHTHALMIC | 12 refills | Status: DC

## 2020-03-26 MED ORDER — OFLOXACIN 0.3 % OP SOLN: 1.0000 [drp] | Freq: Four times a day (QID) | OPHTHALMIC | 0 refills | Status: DC

## 2020-03-26 NOTE — ED Triage Notes (Signed)
Swelling to RT eye x 1 week, denies any injuries

## 2020-03-26 NOTE — Discharge Instructions (Signed)
Use eye drops as prescribed and to completion Pataday was prescribed ofloxacin was prescribed Was advised to use OTC Flonase for allergy Return or follow up with PCP if symptoms persists such as fever, chills, redness, swelling, eye pain, painful eye movements, vision changes, etc..Marland Kitchen

## 2020-03-26 NOTE — ED Provider Notes (Signed)
[facility]   [id number] 03/26/20 Arrival Time: 1027  CC: Eye irritation  SUBJECTIVE:  Alexandra Shaffer is a 49 y.o. female who presents with complaint of right eye irritation, watery  eye and itchiness that started yesterday.  Denies a precipitating event, trauma, or close contacts with similar symptoms.  Has tried OTC eye drops without relief.  Nothing made her symptoms worse.  Denies similar symptoms in the past.  Denies fever, chills, nausea, vomiting, eye pain, painful eye movements, halos,  itching, vision changes, double vision, FB sensation, periorbital erythema.     Denies contact lens use.    ROS: As per HPI.  All other pertinent ROS negative.     Past Medical History:  Diagnosis Date  . Anxiety   . Depression   . Hot flashes 11/26/2014  . Mood swings 11/26/2014   Past Surgical History:  Procedure Laterality Date  . CHOLECYSTECTOMY     Allergies  Allergen Reactions  . Codeine Nausea And Vomiting  . Keflex [Cephalexin] Itching and Rash   No current facility-administered medications on file prior to encounter.   Current Outpatient Medications on File Prior to Encounter  Medication Sig Dispense Refill  . acetaminophen (TYLENOL) 500 MG tablet Take 1,000 mg by mouth every 6 (six) hours as needed for mild pain or headache.     . ALPRAZolam (XANAX) 1 MG tablet Take 0.5-1 mg by mouth 2 (two) times daily as needed for anxiety.     . DULoxetine (CYMBALTA) 60 MG capsule Take 60 mg by mouth daily.    Marland Kitchen ibuprofen (ADVIL,MOTRIN) 200 MG tablet Take 600 mg by mouth 2 (two) times daily as needed for cramping.     . nystatin-triamcinolone ointment (MYCOLOG) Apply 1 application topically 2 (two) times daily. 30 g 1   Social History   Socioeconomic History  . Marital status: Married    Spouse name: Not on file  . Number of children: Not on file  . Years of education: Not on file  . Highest education level: Not on file  Occupational History  . Not on file  Tobacco Use  .  Smoking status: Former Smoker    Types: Cigarettes  . Smokeless tobacco: Never Used  Substance and Sexual Activity  . Alcohol use: No  . Drug use: No  . Sexual activity: Yes    Birth control/protection: None    Comment: husband had vasectomy  Other Topics Concern  . Not on file  Social History Narrative  . Not on file   Social Determinants of Health   Financial Resource Strain:   . Difficulty of Paying Living Expenses:   Food Insecurity:   . Worried About Programme researcher, broadcasting/film/video in the Last Year:   . Barista in the Last Year:   Transportation Needs:   . Freight forwarder (Medical):   Marland Kitchen Lack of Transportation (Non-Medical):   Physical Activity:   . Days of Exercise per Week:   . Minutes of Exercise per Session:   Stress:   . Feeling of Stress :   Social Connections:   . Frequency of Communication with Friends and Family:   . Frequency of Social Gatherings with Friends and Family:   . Attends Religious Services:   . Active Member of Clubs or Organizations:   . Attends Banker Meetings:   Marland Kitchen Marital Status:   Intimate Partner Violence:   . Fear of Current or Ex-Partner:   . Emotionally Abused:   .  Physically Abused:   . Sexually Abused:    Family History  Problem Relation Age of Onset  . Anxiety disorder Mother   . Stroke Father   . Hypertension Father   . Diabetes Father   . Other Father        has a pacemaker  . Anxiety disorder Sister   . Depression Sister   . Hyperlipidemia Daughter   . Cancer Maternal Grandmother   . Diabetes Maternal Grandmother   . Stroke Maternal Grandmother   . Heart disease Maternal Grandmother   . Kidney disease Maternal Grandmother   . Breast cancer Maternal Grandmother   . Cancer Maternal Grandfather   . Emphysema Paternal Grandmother   . Stroke Paternal Grandmother   . Heart disease Paternal Grandmother   . Diabetes Paternal Grandmother   . Other Paternal Grandfather        "black lung"     OBJECTIVE:    Visual Acuity  Right Eye Distance:   Left Eye Distance:   Bilateral Distance:    Right Eye Near:   Left Eye Near:    Bilateral Near:      Vitals:   03/26/20 1043 03/26/20 1105  BP: 124/81   Pulse: 75   Resp: 16   Temp: 97.9 F (36.6 C)   TempSrc: Oral   SpO2: 97%   Weight:  (!) 305 lb (138.3 kg)  Height:  5\' 10"  (1.778 m)    Physical Exam Vitals and nursing note reviewed.  Constitutional:      General: She is not in acute distress.    Appearance: Normal appearance. She is normal weight. She is not ill-appearing, toxic-appearing or diaphoretic.  HENT:     Head: Normocephalic.     Right Ear: Ear canal and external ear normal. A middle ear effusion is present. There is no impacted cerumen.     Left Ear: Ear canal and external ear normal. A middle ear effusion is present. There is no impacted cerumen.  Eyes:     General: Lids are normal. Lids are everted, no foreign bodies appreciated. Vision grossly intact. Gaze aligned appropriately. No visual field deficit.       Right eye: Discharge present. No foreign body or hordeolum.        Left eye: No foreign body, discharge or hordeolum.     Conjunctiva/sclera:     Right eye: Right conjunctiva is not injected. No chemosis, exudate or hemorrhage.    Left eye: Left conjunctiva is not injected. No exudate or hemorrhage. Cardiovascular:     Rate and Rhythm: Normal rate and regular rhythm.     Pulses: Normal pulses.     Heart sounds: Normal heart sounds. No murmur. No friction rub. No gallop.   Pulmonary:     Effort: Pulmonary effort is normal. No respiratory distress.     Breath sounds: Normal breath sounds. No stridor. No wheezing, rhonchi or rales.  Chest:     Chest wall: No tenderness.  Neurological:     Mental Status: She is alert.      ASSESSMENT & PLAN:  1. Allergic conjunctivitis of right eye     Meds ordered this encounter  Medications  . ofloxacin (OCUFLOX) 0.3 % ophthalmic solution     Sig: Place 1 drop into the right eye 4 (four) times daily.    Dispense:  5 mL    Refill:  0  . olopatadine (PATADAY) 0.1 % ophthalmic solution    Sig: Place 1 drop into the  right eye 2 (two) times daily.    Dispense:  5 mL    Refill:  12     Discharge instructions Use eye drops as prescribed and to completion Pataday was prescribed ofloxacin was prescribed Was advised to use OTC Flonase for allergy Return or follow up with PCP if symptoms persists such as fever, chills, redness, swelling, eye pain, painful eye movements, vision changes, etc...  Reviewed expectations re: course of current medical issues. Questions answered. Outlined signs and symptoms indicating need for more acute intervention. Patient verbalized understanding. After Visit Summary given.   Emerson Monte, FNP 03/26/20 1107

## 2020-03-28 ENCOUNTER — Emergency Department (HOSPITAL_COMMUNITY): Payer: PRIVATE HEALTH INSURANCE

## 2020-03-28 ENCOUNTER — Emergency Department (HOSPITAL_COMMUNITY)
Admission: EM | Admit: 2020-03-28 | Discharge: 2020-03-28 | Disposition: A | Discharge status: Home / Self Care | Payer: PRIVATE HEALTH INSURANCE | Attending: Emergency Medicine | Admitting: Emergency Medicine

## 2020-03-28 ENCOUNTER — Other Ambulatory Visit: Payer: Self-pay

## 2020-03-28 ENCOUNTER — Encounter (HOSPITAL_COMMUNITY): Payer: Self-pay | Admitting: Emergency Medicine

## 2020-03-28 DIAGNOSIS — Z87891 Personal history of nicotine dependence: Secondary | ICD-10-CM | POA: Insufficient documentation

## 2020-03-28 DIAGNOSIS — E669 Obesity, unspecified: Secondary | ICD-10-CM | POA: Insufficient documentation

## 2020-03-28 DIAGNOSIS — M25562 Pain in left knee: Secondary | ICD-10-CM | POA: Diagnosis not present

## 2020-03-28 DIAGNOSIS — Z79899 Other long term (current) drug therapy: Secondary | ICD-10-CM | POA: Insufficient documentation

## 2020-03-28 DIAGNOSIS — M25462 Effusion, left knee: Secondary | ICD-10-CM | POA: Diagnosis not present

## 2020-03-28 IMAGING — DX DG KNEE COMPLETE 4+V*L*
4 series · 4 of 4 positions shown · non-contrast
Comparison: None.

CLINICAL DATA: LEFT knee pain after hearing a pop while dancing
last night. Difficulty bearing weight.

EXAM:
LEFT KNEE - COMPLETE 4+ VIEW

[knee ap]
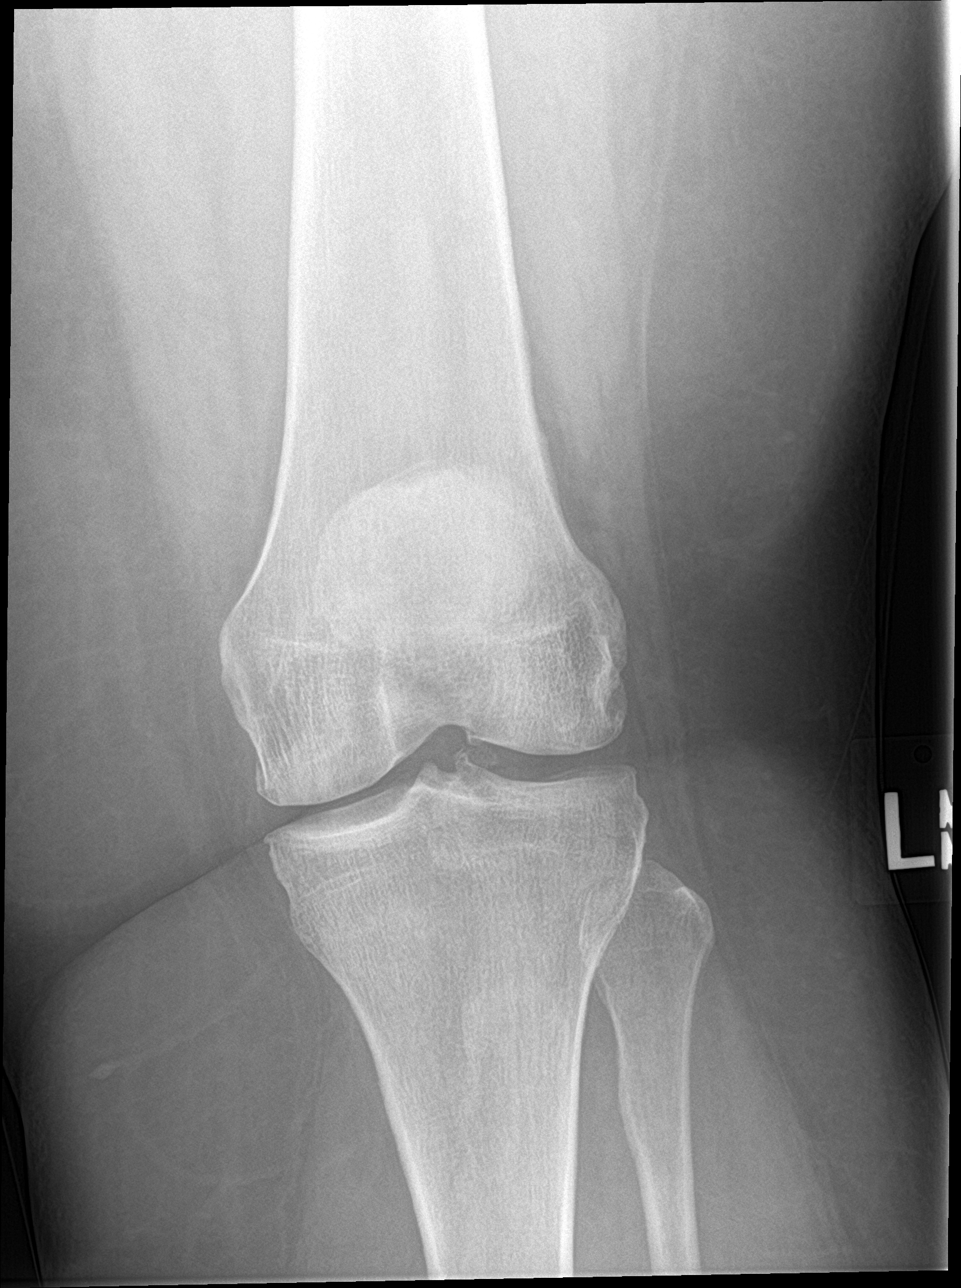

[knee obl (1 of 2)]
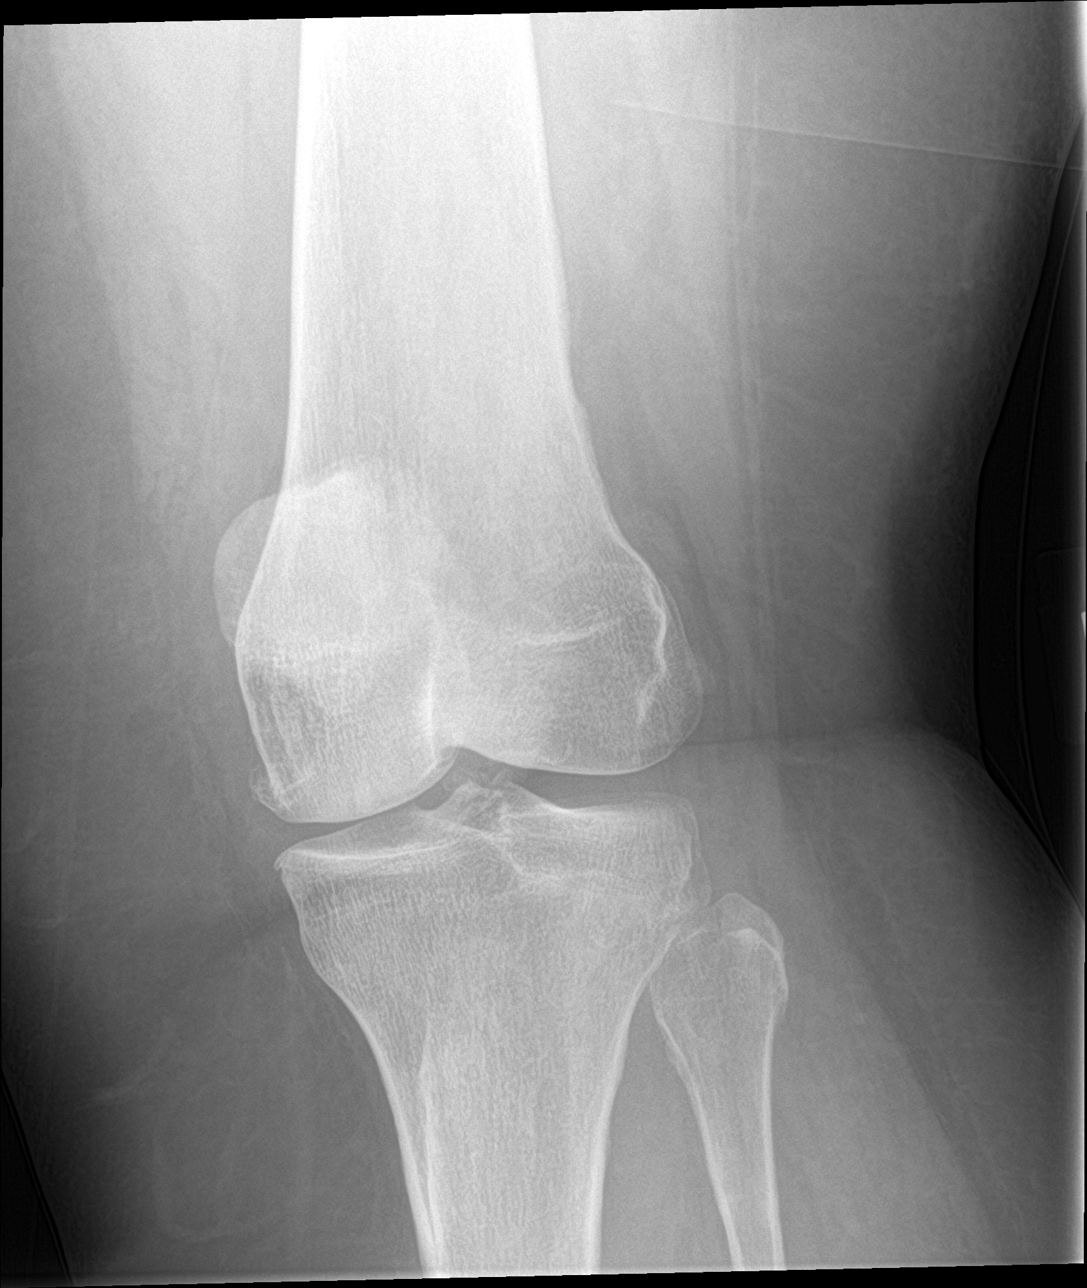

[knee obl (2 of 2)]
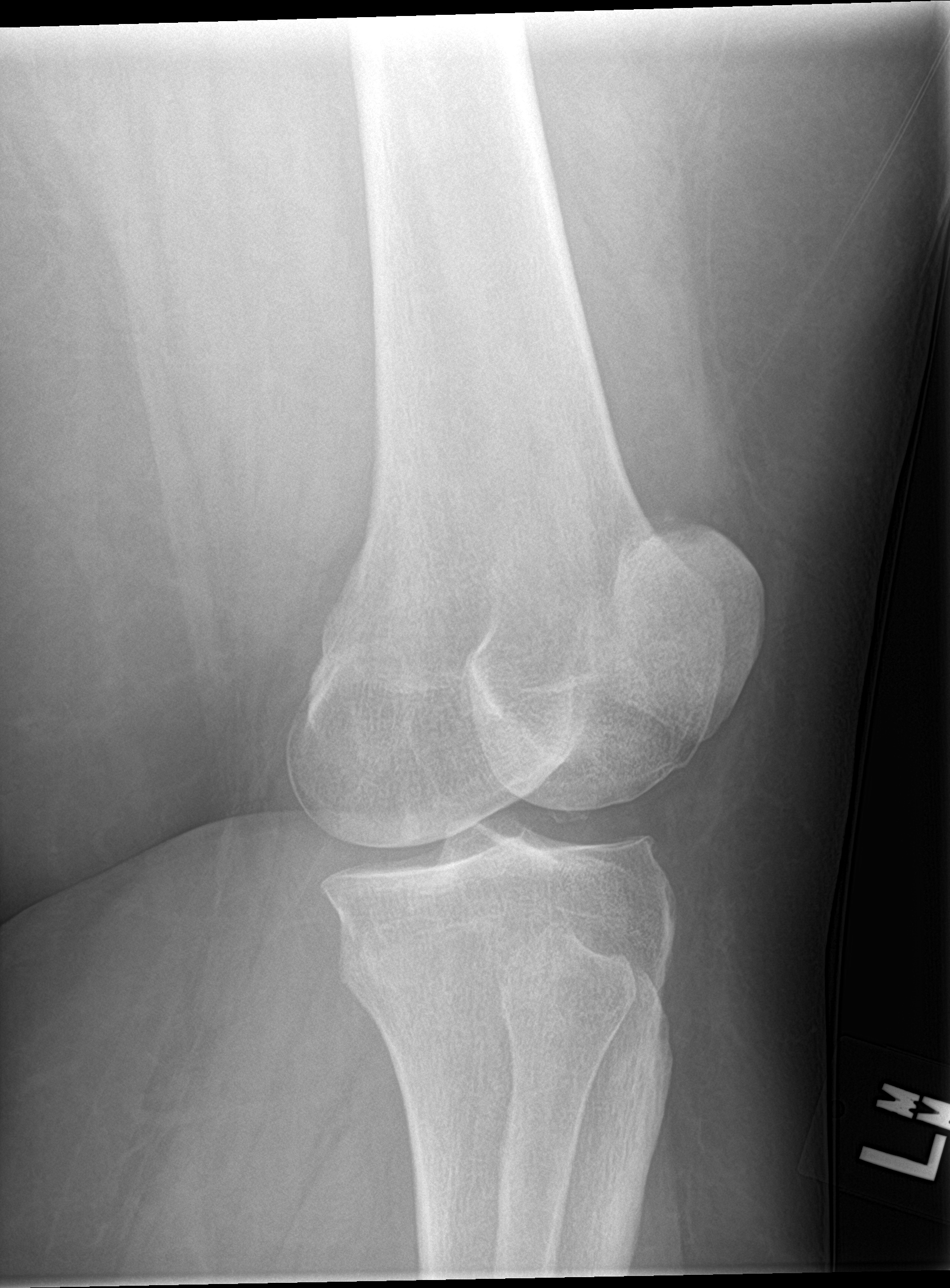

[knee lat]
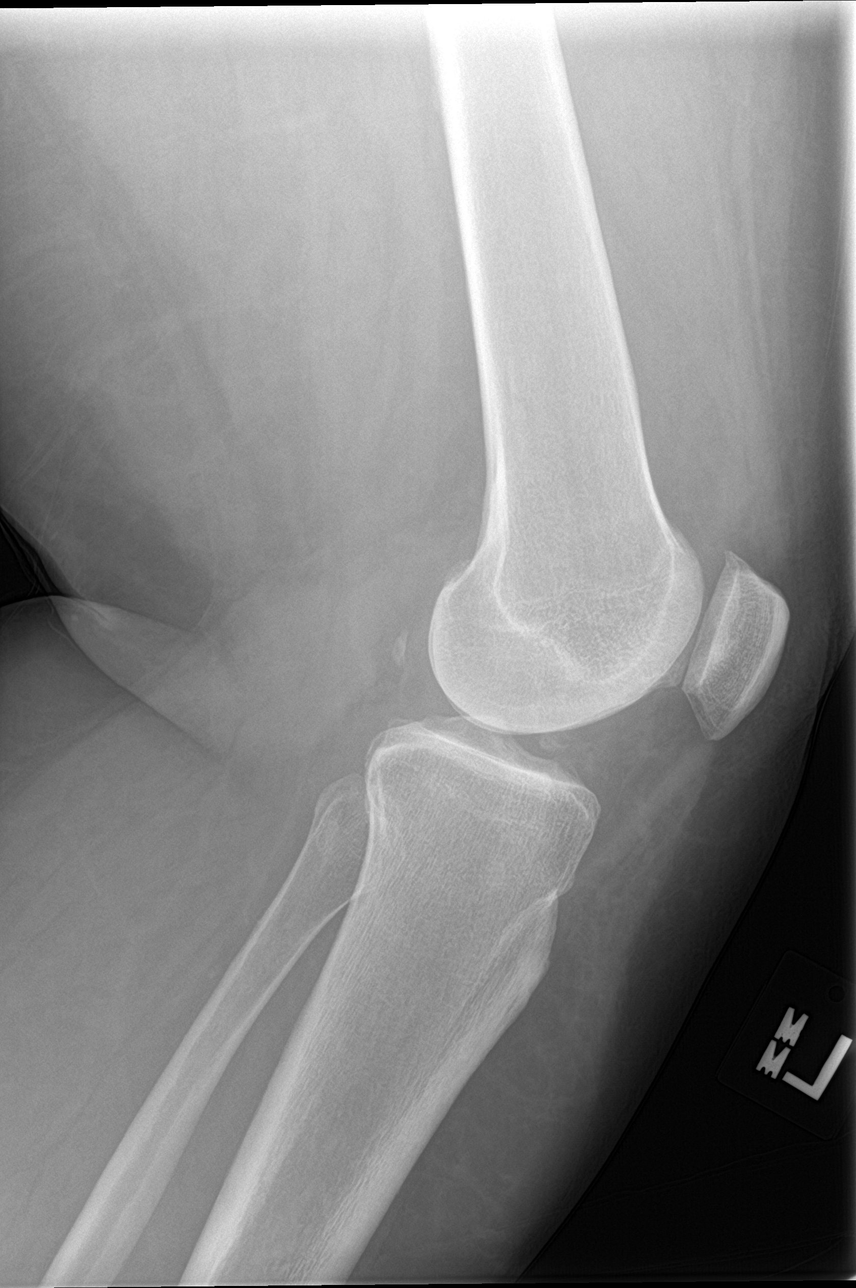

[4 of 4 positions shown; findings below may reference images not displayed]

FINDINGS: Small, faintly radiopaque 7 millimeter density is identified,
projecting over the central aspect of the intercondylar notch,
suspicious for loose body. There is mild degenerative change of the
MEDIAL and patellofemoral compartments. Joint effusion is present.
IMPRESSION: 1. Suspect small loose body in the intercondylar notch.
2. Joint effusion.

## 2020-03-28 NOTE — ED Triage Notes (Signed)
Patient c/o left knee pain. Per patient dancing last night at graduation party when "she felt a pop with pain." Patient reports using ice and elevation with no relief. Per patient difficult to bear weight.

## 2020-03-28 NOTE — ED Notes (Signed)
Pt with crutches at home so she declines crutches

## 2020-03-28 NOTE — ED Notes (Signed)
Pt here for eval of knee pain after twisting it dancing   Has returned from [place]

## 2020-03-28 NOTE — ED Provider Notes (Signed)
[facility] EMERGENCY DEPARTMENT Provider Note   CSN: [id number] Arrival date & time: 03/28/20  1013     History Chief Complaint  Patient presents with  . Knee Pain    Alexandra Shaffer is a 49 y.o. female history obesity, anxiety/depression, cholecystectomy.  Patient presents today for left knee pain onset around 9 PM last night.  She was at a graduation party and performing a dance called the "wobble" when she felt a pop in her left knee followed by immediate pain.  She describes a throbbing pain constant worsened with ambulation nonradiating moderate intensity.  Denies fall to the ground, head injury, back pain, numbness/tingling, weakness, extremity swelling/color change or any additional concerns.  HPI     Past Medical History:  Diagnosis Date  . Anxiety   . Depression   . Hot flashes 11/26/2014  . Mood swings 11/26/2014    Patient Active Problem List   Diagnosis Date Noted  . Screening for colorectal cancer 11/26/2019  . Encounter for gynecological examination with Papanicolaou smear of cervix 11/26/2019  . Missed periods 11/26/2019  . Night sweats 11/26/2019  . Peri-menopause 11/26/2019  . LLQ pain 08/28/2019  . Urinary frequency 08/28/2019  . Superficial fungus infection of skin 08/28/2019  . Hot flashes 11/26/2014  . Mood swings 11/26/2014    Past Surgical History:  Procedure Laterality Date  . CHOLECYSTECTOMY       OB History    Gravida  2   Para  2   Term      Preterm      AB      Living  2     SAB      TAB      Ectopic      Multiple      Live Births              Family History  Problem Relation Age of Onset  . Anxiety disorder Mother   . Stroke Father   . Hypertension Father   . Diabetes Father   . Other Father        has a pacemaker  . Anxiety disorder Sister   . Depression Sister   . Hyperlipidemia Daughter   . Cancer Maternal Grandmother   . Diabetes Maternal Grandmother   . Stroke Maternal Grandmother   . Heart  disease Maternal Grandmother   . Kidney disease Maternal Grandmother   . Breast cancer Maternal Grandmother   . Cancer Maternal Grandfather   . Emphysema Paternal Grandmother   . Stroke Paternal Grandmother   . Heart disease Paternal Grandmother   . Diabetes Paternal Grandmother   . Other Paternal Grandfather        "black lung"    Social History   Tobacco Use  . Smoking status: Former Smoker    Types: Cigarettes  . Smokeless tobacco: Never Used  Substance Use Topics  . Alcohol use: No  . Drug use: No    Home Medications Prior to Admission medications   Medication Sig Start Date End Date Taking? Authorizing Provider  acetaminophen (TYLENOL) 500 MG tablet Take 1,000 mg by mouth every 6 (six) hours as needed for mild pain or headache.     [provider]  ALPRAZolam Prudy Feeler) 1 MG tablet Take 0.5-1 mg by mouth 2 (two) times daily as needed for anxiety.     [provider]  DULoxetine (CYMBALTA) 60 MG capsule Take 60 mg by mouth daily.    [provider]  ibuprofen (ADVIL,MOTRIN)  200 MG tablet Take 600 mg by mouth 2 (two) times daily as needed for cramping.     [provider]  nystatin-triamcinolone ointment (MYCOLOG) Apply 1 application topically 2 (two) times daily. 08/28/19   Estill Dooms, NP  ofloxacin (OCUFLOX) 0.3 % ophthalmic solution Place 1 drop into the right eye 4 (four) times daily. 03/26/20   Avegno, Darrelyn Hillock, FNP  olopatadine (PATADAY) 0.1 % ophthalmic solution Place 1 drop into the right eye 2 (two) times daily. 03/26/20   Avegno, Darrelyn Hillock, FNP    Allergies    Codeine and Keflex [cephalexin]  Review of Systems   Review of Systems  Constitutional: Negative.  Negative for chills and fever.  Musculoskeletal: Positive for arthralgias (Left knee). Negative for back pain and neck pain.  Skin: Negative.  Negative for color change.  Neurological: Negative.  Negative for weakness, numbness and headaches.    Physical  Exam Updated Vital Signs BP 121/62 (BP Location: Right Arm)   Pulse 85   Temp 98.5 F (36.9 C) (Oral)   Resp 18   Ht 5\' 10"  (1.778 m)   Wt (!) 137.9 kg   SpO2 97%   BMI 43.62 kg/m   Physical Exam Constitutional:      General: She is not in acute distress.    Appearance: Normal appearance. She is well-developed. She is obese. She is not ill-appearing or diaphoretic.  HENT:     Head: Normocephalic and atraumatic.  Eyes:     General: Vision grossly intact. Gaze aligned appropriately.     Pupils: Pupils are equal, round, and reactive to light.  Neck:     Trachea: Trachea and phonation normal.  Pulmonary:     Effort: Pulmonary effort is normal. No respiratory distress.  Abdominal:     General: There is no distension.     Palpations: Abdomen is soft.     Tenderness: There is no abdominal tenderness. There is no guarding or rebound.  Musculoskeletal:        General: Normal range of motion.     Cervical back: Normal range of motion.     Comments: Left Knee: Examination limited by body habitus.  No appreciable difference between bilateral knees on visual examination.  No obvious deformity, no skin break, no erythema fluctuance or induration.  Patient has tenderness over the lateral joint line of the left knee.  Active and passive extension and flexion at the knee is intact with some increase in pain.  Negative anterior/posterior drawers.  No tenderness at the hip, thigh, lower leg, ankle or foot.  Full range of motion appropriate strength at the hip ankle and foot without pain.  Strong and equal pedal pulses.  Compartments soft to palpation.  Skin:    General: Skin is warm and dry.  Neurological:     Mental Status: She is alert.     GCS: GCS eye subscore is 4. GCS verbal subscore is 5. GCS motor subscore is 6.     Comments: Speech is clear and goal oriented, follows commands Major Cranial nerves without deficit, no facial droop Moves extremities without ataxia, coordination intact   Psychiatric:        Behavior: Behavior normal.     ED Results / Procedures / Treatments   Labs (all labs ordered are listed, but only abnormal results are displayed) Labs Reviewed - No data to display  EKG None  Radiology DG Knee Complete 4 Views Left  Result Date: 03/28/2020 CLINICAL DATA:  LEFT knee  pain after hearing a pop while dancing last night. Difficulty bearing weight. EXAM: LEFT KNEE - COMPLETE 4+ VIEW COMPARISON:  None. FINDINGS: Small, faintly radiopaque 7 millimeter density is identified, projecting over the central aspect of the intercondylar notch, suspicious for loose body. There is mild degenerative change of the MEDIAL and patellofemoral compartments. Joint effusion is present. IMPRESSION: 1. Suspect small loose body in the intercondylar notch. 2. Joint effusion. Electronically Signed   By: Norva Pavlov M.D.   On: 03/28/2020 11:00    Procedures Procedures (including critical care time)  Medications Ordered in ED Medications - No data to display  ED Course  I have reviewed the triage vital signs and the nursing notes.  Pertinent labs & imaging results that were available during my care of the patient were reviewed by me and considered in my medical decision making (see chart for details).    MDM Rules/Calculators/A&P                      Additional History Obtained: 1. Nursing notes from this visit. 2. Patient had a prior urgent care visit 2 days ago, 03/26/2020.  She had diagnosis of allergic conjunctivitis of the right eye.  Patient reports that the symptoms have greatly improved and not concerning her today. - DG left knee:  IMPRESSION:  1. Suspect small loose body in the intercondylar notch.  2. Joint effusion.  I have personally reviewed patient's left knee x-ray and agree with radiologist interpretation. - On evaluation patient resting comfortably no acute distress.  I reviewed patient's x-ray with her and she stated understanding.  Possible that  patient may have ligamentous injury or possibly meniscal another soft tissue injury today.  Patient was placed in the immobilizer, given crutches and referred to orthopedic for further evaluation.  RICE therapy discussed.  There is no evidence of cellulitis, septic arthritis, DVT, compartment syndrome, neurovascular compromise or other emergent pathologies requiring further ER work-up at this time.  Patient urged to call orthopedist Dr. Romeo Apple today to schedule a follow-up appointment.  At this time there does not appear to be any evidence of an acute emergency medical condition and the patient appears stable for discharge with appropriate outpatient follow up. Diagnosis was discussed with patient who verbalizes understanding of care plan and is agreeable to discharge. I have discussed return precautions with patient and husband who verbalizes understanding. Patient encouraged to follow-up with their PCP and ortho. All questions answered.  Patient's case discussed with Dr. Jacqulyn Bath who agrees with plan to discharge with follow-up.   Note: Portions of this report may have been transcribed using voice recognition software. Every effort was made to ensure accuracy; however, inadvertent computerized transcription errors may still be present. Final Clinical Impression(s) / ED Diagnoses Final diagnoses:  Acute pain of left knee    Rx / DC Orders ED Discharge Orders    None       Elizabeth Palau 03/28/20 1242    Long, Arlyss Repress, MD 03/29/20 1323

## 2020-03-28 NOTE — Discharge Instructions (Signed)
At this time there does not appear to be the presence of an emergent medical condition, however there is always the potential for conditions to change. Please read and follow the below instructions.  Please return to the Emergency Department immediately for any new or worsening symptoms. Please be sure to follow up with your Primary Care Provider within one week regarding your visit today; please call their office to schedule an appointment even if you are feeling better for a follow-up visit. Please call the orthopedic specialist Dr. Romeo Apple on your discharge paperwork for a follow-up appointment for further evaluation of your knee pain.  As we discussed further evaluation by the orthopedic specialist is needed for definitive diagnosis of your knee pain.  It is possible that you have a ligamentous, tendon, meniscal or other soft tissue injury causing your pain and effusion.  Additionally your x-ray showed a loose body within your knee which may be contributing to your symptoms.  Use rest, ice and elevation to help with your symptoms.  Use the knee immobilizer and crutches given to you today to help avoid placing weight on your knee while moving around.  You may remove the knee immobilizer while you are resting and laying down.  Get help right away if: Your knee swells, and the swelling gets worse. You cannot move your knee. You have very bad knee pain. You have fever or chills You have any new/concerning or worsening of symptoms  Please read the additional information packets attached to your discharge summary.  Do not take your medicine if  develop an itchy rash, swelling in your mouth or lips, or difficulty breathing; call 911 and seek immediate emergency medical attention if this occurs.  Note: Portions of this text may have been transcribed using voice recognition software. Every effort was made to ensure accuracy; however, inadvertent computerized transcription errors may still be present.

## 2020-03-30 ENCOUNTER — Other Ambulatory Visit: Payer: Self-pay | Admitting: Orthopedic Surgery

## 2020-03-30 DIAGNOSIS — S83242S Other tear of medial meniscus, current injury, left knee, sequela: Secondary | ICD-10-CM

## 2020-04-01 ENCOUNTER — Ambulatory Visit: Payer: PRIVATE HEALTH INSURANCE | Admitting: Orthopedic Surgery

## 2020-04-30 ENCOUNTER — Ambulatory Visit
Admission: RE | Admit: 2020-04-30 | Discharge: 2020-04-30 | Disposition: A | Discharge status: Home / Self Care | Payer: PRIVATE HEALTH INSURANCE | Source: Ambulatory Visit | Attending: Orthopedic Surgery | Admitting: Orthopedic Surgery

## 2020-04-30 DIAGNOSIS — S83242S Other tear of medial meniscus, current injury, left knee, sequela: Secondary | ICD-10-CM

## 2020-04-30 IMAGING — MR MR KNEE*L* W/O CM
4 of 7 series · 22 of 40 positions shown · non-contrast
Comparison: None.

CLINICAL DATA: Heard a pop while dancing. Pain around the patella
and lateral aspect.

EXAM:
MRI OF THE LEFT KNEE WITHOUT CONTRAST
TECHNIQUE: Multiplanar, multisequence MR imaging of the knee was performed. No
intravenous contrast was administered.

[Series 3: T2 fat-sat · axial · 4.0mm · 0.50mm/px · z∈[-54,+61]mm · 5 of 24 slices shown]
[im 1/24]
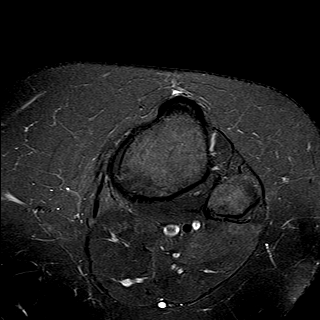
[im 5/24]
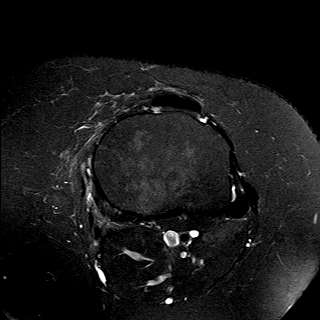
[im 10/24]
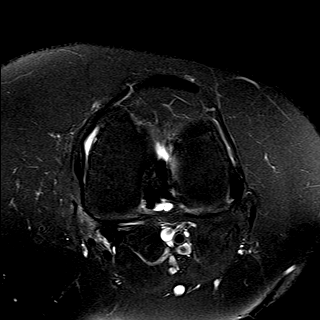
[im 14/24]
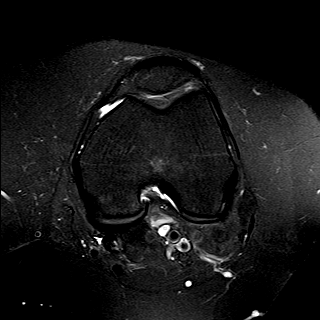
[im 24/24]
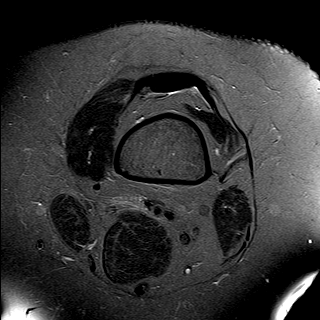

[Series 7: PD fat-sat · sagittal · 3.0mm · 0.29mm/px · 7 of 27 slices shown (1 of 3)]
[im 1/27]
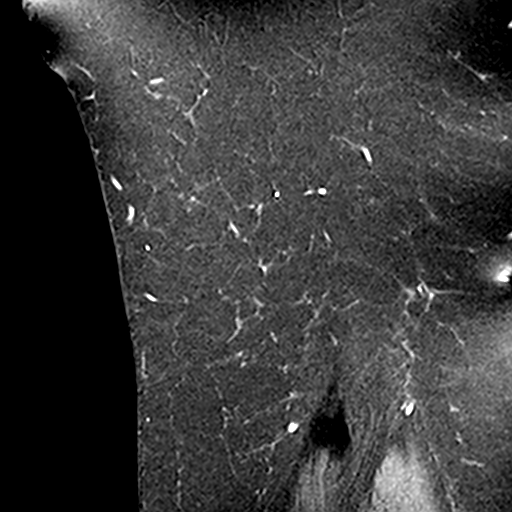
[im 5/27]
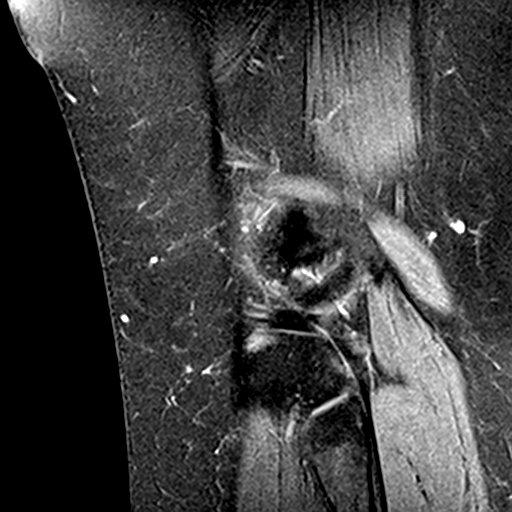
[im 9/27]
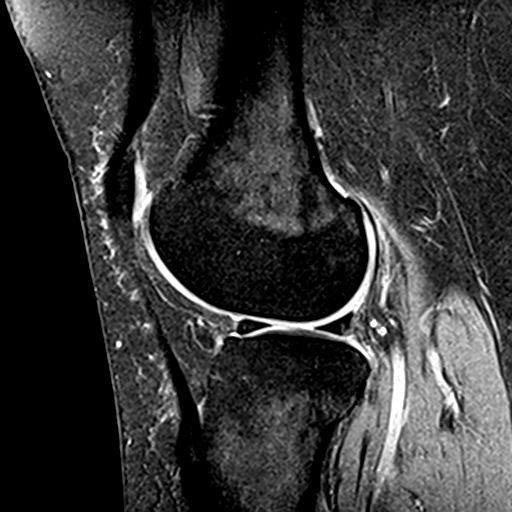
[im 14/27]
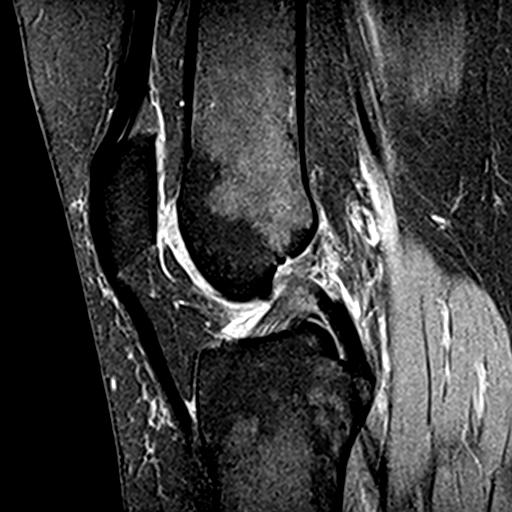
[im 18/27]
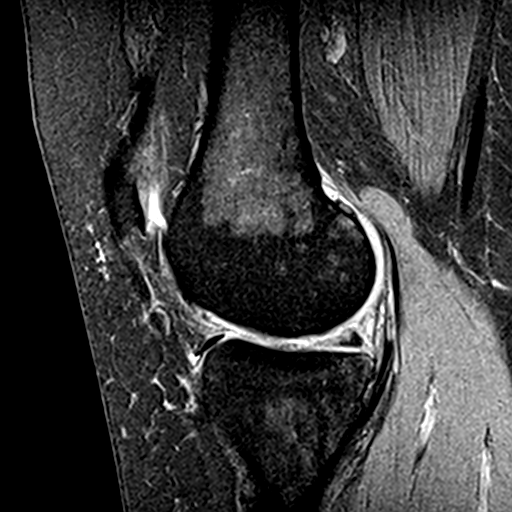
[im 22/27]
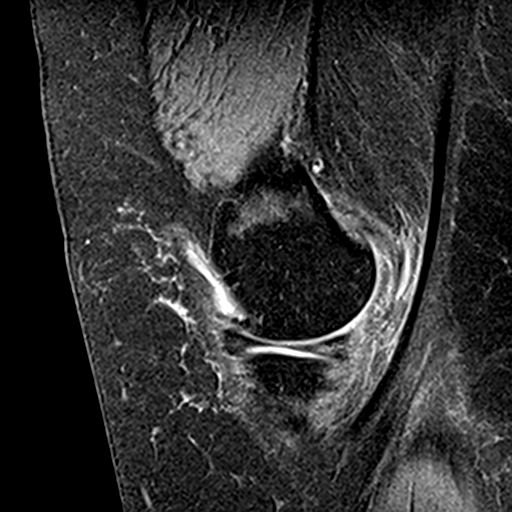
[im 27/27]
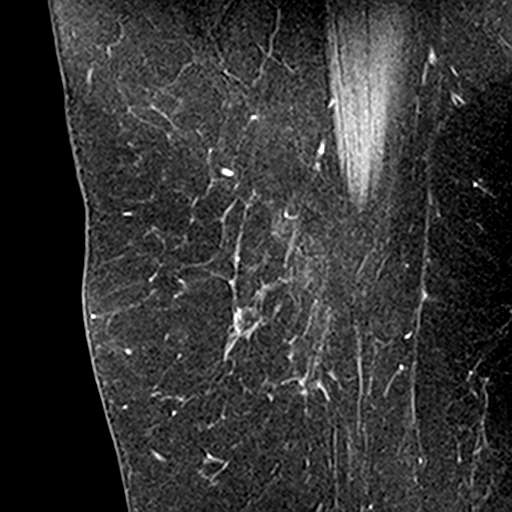

[Series 8: PD fat-sat · coronal · 3.0mm · 0.29mm/px · 7 of 28 slices shown (2 of 3)]
[im 1/28]
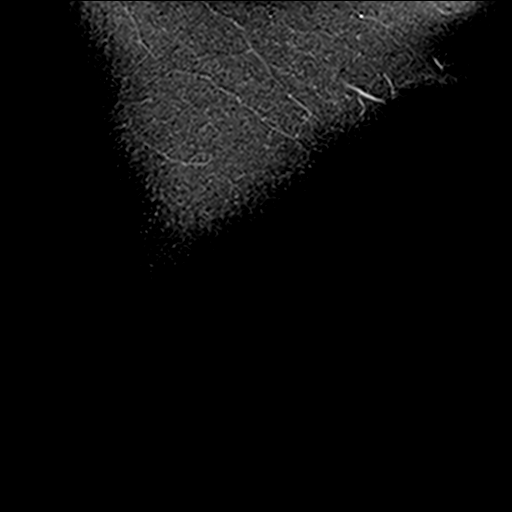
[im 5/28]
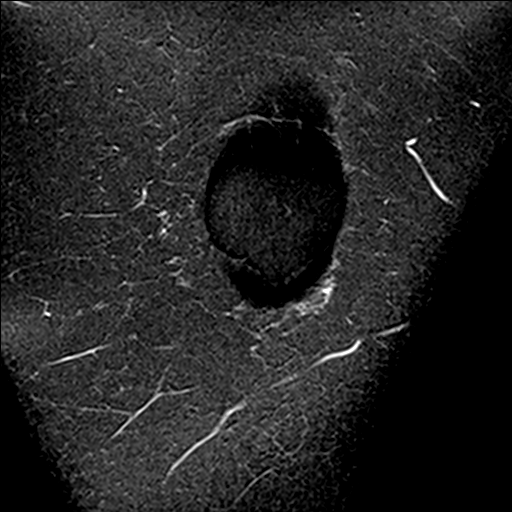
[im 10/28]
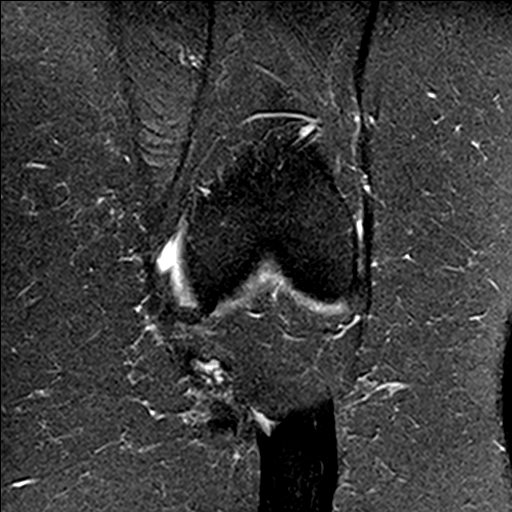
[im 14/28]
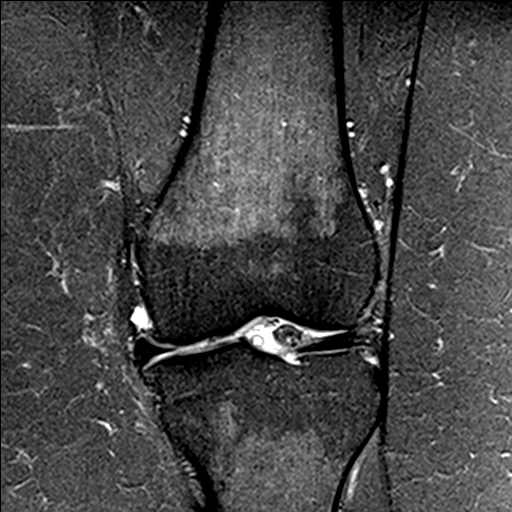
[im 19/28]
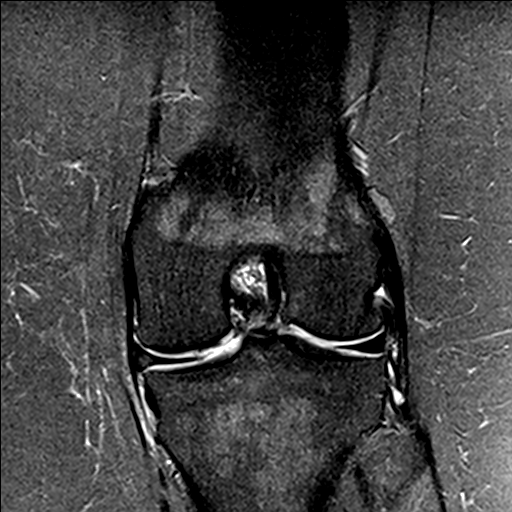
[im 23/28]
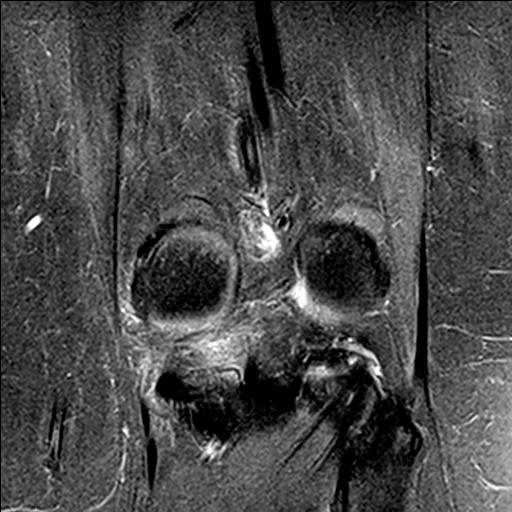
[im 28/28]
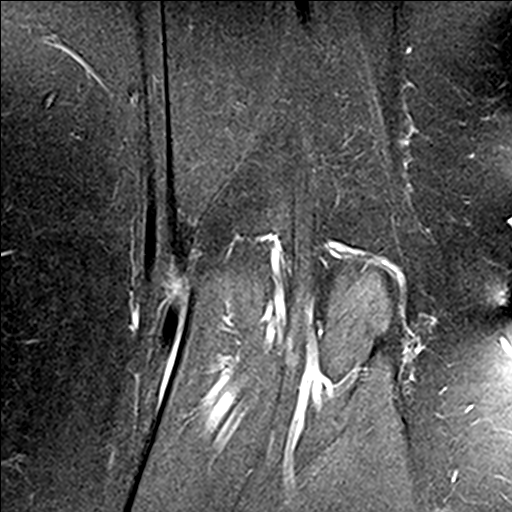

[Series 9: PD fat-sat · coronal · 2.0mm · 0.29mm/px · 3 of 11 slices shown (3 of 3)]
[im 1/11]
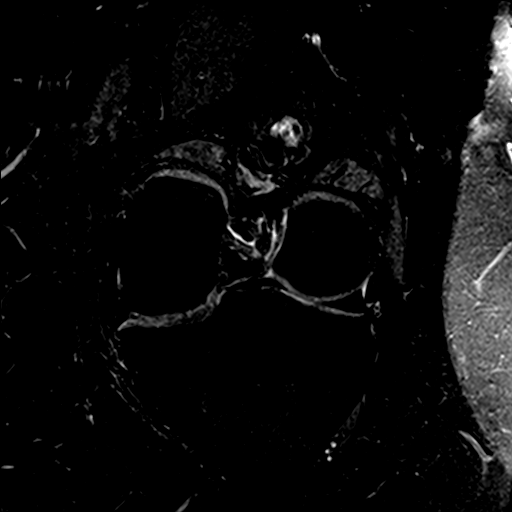
[im 6/11]
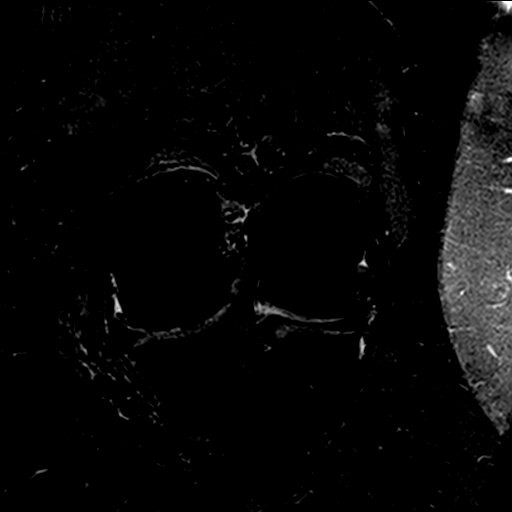
[im 11/11]
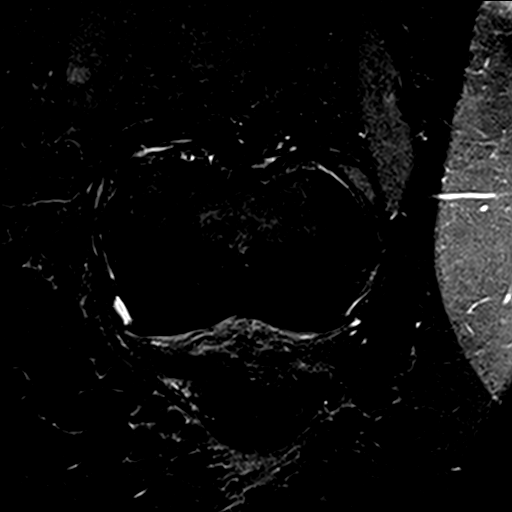

[22 of 40 positions shown; findings below may reference images not displayed]

FINDINGS: MENISCI

Medial meniscus: Radial tear of the posterior horn of the medial
meniscus near the meniscal root with peripheral meniscal extrusion.

Lateral meniscus:  Intact.

LIGAMENTS

Cruciates:  Intact ACL and PCL.

Collaterals: Medial collateral ligament is intact. Lateral
collateral ligament complex is intact.

CARTILAGE

Patellofemoral: Partial-thickness cartilage loss of the
patellofemoral compartment.

Medial: Mild partial-thickness cartilage loss of the medial
femorotibial compartment.

Lateral:  No chondral defect.

Joint:  No joint effusion. Normal Hoffa's fat. No plical thickening.

Popliteal Fossa:  Tiny Baker's cyst. Intact popliteus tendon.

Extensor Mechanism: Intact quadriceps tendon. Intact patellar
tendon. Intact medial patellar retinaculum. Intact lateral patellar
retinaculum. Intact MPFL.

Bones:  No acute osseous abnormality. No aggressive osseous lesion.

Other: No fluid collection or hematoma. Muscles are normal.
IMPRESSION: 1. Radial tear of the posterior horn of the medial meniscus near the
meniscal root with peripheral meniscal extrusion.
2. Partial-thickness cartilage loss of the patellofemoral
compartment.
3. Mild partial-thickness cartilage loss of the medial femorotibial
compartment.

## 2020-08-05 ENCOUNTER — Other Ambulatory Visit: Payer: Self-pay

## 2020-08-05 ENCOUNTER — Encounter: Payer: Self-pay | Admitting: Obstetrics & Gynecology

## 2020-08-05 ENCOUNTER — Ambulatory Visit (INDEPENDENT_AMBULATORY_CARE_PROVIDER_SITE_OTHER): Payer: PRIVATE HEALTH INSURANCE | Admitting: Obstetrics & Gynecology

## 2020-08-05 VITALS — BP 122/78 | HR 79 | Ht 70.0 in | Wt 303.0 lb

## 2020-08-05 DIAGNOSIS — N946 Dysmenorrhea, unspecified: Secondary | ICD-10-CM | POA: Diagnosis not present

## 2020-08-05 DIAGNOSIS — T457X1A Poisoning by anticoagulant antagonists, vitamin K and other coagulants, accidental (unintentional), initial encounter: Secondary | ICD-10-CM

## 2020-08-05 DIAGNOSIS — N921 Excessive and frequent menstruation with irregular cycle: Secondary | ICD-10-CM

## 2020-08-05 DIAGNOSIS — D689 Coagulation defect, unspecified: Secondary | ICD-10-CM | POA: Diagnosis not present

## 2020-08-05 LAB — POCT HEMOGLOBIN: Hemoglobin: 14.1 g/dL (ref 11–14.6)

## 2020-08-05 MED ORDER — MEGESTROL ACETATE 40 MG PO TABS: ORAL_TABLET | ORAL | 3 refills | Status: DC

## 2020-08-05 NOTE — Progress Notes (Signed)
Chief Complaint  Patient presents with  . Menorrhagia    w/clots since October 12, "feeling weak"      49 y.o. G2P2 Patient's last menstrual period was 08/03/2020. The current method of family planning is vasectomy.  Outpatient Encounter Medications as of 08/05/2020  Medication Sig  . ALPRAZolam (XANAX) 1 MG tablet Take 0.5-1 mg by mouth 2 (two) times daily as needed for anxiety.   . DULoxetine (CYMBALTA) 60 MG capsule Take 60 mg by mouth daily.  Marland Kitchen ELIQUIS 5 MG TABS tablet Take 5 mg by mouth 2 (two) times daily.  Marland Kitchen acetaminophen (TYLENOL) 500 MG tablet Take 1,000 mg by mouth every 6 (six) hours as needed for mild pain or headache.  (Patient not taking: Reported on 08/05/2020)  . ibuprofen (ADVIL,MOTRIN) 200 MG tablet Take 600 mg by mouth 2 (two) times daily as needed for cramping.  (Patient not taking: Reported on 08/05/2020)  . megestrol (MEGACE) 40 MG tablet 3 tablets a day for 5 days, 2 tablets a day for 5 days then 1 tablet daily  . [DISCONTINUED] nystatin-triamcinolone ointment (MYCOLOG) Apply 1 application topically 2 (two) times daily. (Patient not taking: Reported on 08/05/2020)  . [DISCONTINUED] ofloxacin (OCUFLOX) 0.3 % ophthalmic solution Place 1 drop into the right eye 4 (four) times daily. (Patient not taking: Reported on 08/05/2020)  . [DISCONTINUED] olopatadine (PATADAY) 0.1 % ophthalmic solution Place 1 drop into the right eye 2 (two) times daily. (Patient not taking: Reported on 08/05/2020)   No facility-administered encounter medications on file as of 08/05/2020.    Subjective Pt started on anti coagulation due to post operative orhtopedic DVT left leg after knee scope Had 1 period that was heavy this one is extremely heavy painful Puddle of blood in bed On exam blood on perineum and thighs Past Medical History:  Diagnosis Date  . Anxiety   . Depression   . Hot flashes 11/26/2014  . Mood swings 11/26/2014    Past Surgical History:  Procedure Laterality  Date  . CHOLECYSTECTOMY    . KNEE SURGERY      OB History    Gravida  2   Para  2   Term      Preterm      AB      Living  2     SAB      TAB      Ectopic      Multiple      Live Births              Allergies  Allergen Reactions  . Codeine Nausea And Vomiting  . Keflex [Cephalexin] Itching and Rash    Social History   Socioeconomic History  . Marital status: Married    Spouse name: Not on file  . Number of children: Not on file  . Years of education: Not on file  . Highest education level: Not on file  Occupational History  . Not on file  Tobacco Use  . Smoking status: Former Smoker    Types: Cigarettes  . Smokeless tobacco: Never Used  Vaping Use  . Vaping Use: Never used  Substance and Sexual Activity  . Alcohol use: No  . Drug use: No  . Sexual activity: Yes    Birth control/protection: None    Comment: husband had vasectomy  Other Topics Concern  . Not on file  Social History Narrative  . Not on file   Social Determinants of Health  Financial Resource Strain:   . Difficulty of Paying Living Expenses: Not on file  Food Insecurity:   . Worried About Programme researcher, broadcasting/film/video in the Last Year: Not on file  . Ran Out of Food in the Last Year: Not on file  Transportation Needs:   . Lack of Transportation (Medical): Not on file  . Lack of Transportation (Non-Medical): Not on file  Physical Activity:   . Days of Exercise per Week: Not on file  . Minutes of Exercise per Session: Not on file  Stress:   . Feeling of Stress : Not on file  Social Connections:   . Frequency of Communication with Friends and Family: Not on file  . Frequency of Social Gatherings with Friends and Family: Not on file  . Attends Religious Services: Not on file  . Active Member of Clubs or Organizations: Not on file  . Attends Banker Meetings: Not on file  . Marital Status: Not on file    Family History  Problem Relation Age of Onset  . Anxiety  disorder Mother   . Stroke Father   . Hypertension Father   . Diabetes Father   . Other Father        has a pacemaker  . Anxiety disorder Sister   . Depression Sister   . Hyperlipidemia Daughter   . Cancer Maternal Grandmother   . Diabetes Maternal Grandmother   . Stroke Maternal Grandmother   . Heart disease Maternal Grandmother   . Kidney disease Maternal Grandmother   . Breast cancer Maternal Grandmother   . Cancer Maternal Grandfather   . Emphysema Paternal Grandmother   . Stroke Paternal Grandmother   . Heart disease Paternal Grandmother   . Diabetes Paternal Grandmother   . Other Paternal Grandfather        "black lung"    Medications:       Current Outpatient Medications:  .  ALPRAZolam (XANAX) 1 MG tablet, Take 0.5-1 mg by mouth 2 (two) times daily as needed for anxiety. , Disp: , Rfl:  .  DULoxetine (CYMBALTA) 60 MG capsule, Take 60 mg by mouth daily., Disp: , Rfl:  .  ELIQUIS 5 MG TABS tablet, Take 5 mg by mouth 2 (two) times daily., Disp: , Rfl:  .  acetaminophen (TYLENOL) 500 MG tablet, Take 1,000 mg by mouth every 6 (six) hours as needed for mild pain or headache.  (Patient not taking: Reported on 08/05/2020), Disp: , Rfl:  .  ibuprofen (ADVIL,MOTRIN) 200 MG tablet, Take 600 mg by mouth 2 (two) times daily as needed for cramping.  (Patient not taking: Reported on 08/05/2020), Disp: , Rfl:  .  megestrol (MEGACE) 40 MG tablet, 3 tablets a day for 5 days, 2 tablets a day for 5 days then 1 tablet daily, Disp: 45 tablet, Rfl: 3  Objective Blood pressure 122/78, pulse 79, height 5\' 10"  (1.778 m), weight (!) 303 lb (137.4 kg), last menstrual period 08/03/2020.  General WDWN female NAD Vulva:  normal appearing vulva with no masses, tenderness or lesions Vagina:  normal mucosa, no discharge blood in vault Cervix:  Normal no lesions Uterus:  normal size, contour, position, consistency, mobility, tender Adnexa: ovaries:present,  normal adnexa in size, nontender and no  masses   Pertinent ROS No burning with urination, frequency or urgency No nausea, vomiting or diarrhea Nor fever chills or other constitutional symptoms   Labs or studies     Impression Diagnoses this Encounter::   ICD-10-CM  1. Menorrhagia with irregular cycle  N92.1   2. Dysmenorrhea  N94.6   3. Hemorrhage secondary to anti-coagulation [facility])  T45.7X1A    D68.9     Established relevant diagnosis(es):   Plan/Recommendations: Meds ordered this encounter  Medications  . megestrol (MEGACE) 40 MG tablet    Sig: 3 tablets a day for 5 days, 2 tablets a day for 5 days then 1 tablet daily    Dispense:  45 tablet    Refill:  3    Labs or Scans Ordered: No orders of the defined types were placed in this encounter.   Management:: Megace algorithm while on anti coagulation Will decide on management when comes off of eliquis  Instructed for patient to send me a MyChart message in 2 weeks to tell me how things are going  Follow up No follow-ups on file.       All questions were answered.

## 2020-08-05 NOTE — Addendum Note (Signed)
Addended by: Moss Mc on: 08/05/2020 04:46 PM   Modules accepted: Orders

## 2020-11-11 ENCOUNTER — Other Ambulatory Visit (HOSPITAL_COMMUNITY): Payer: Self-pay | Admitting: Family Medicine

## 2020-11-11 DIAGNOSIS — Z1231 Encounter for screening mammogram for malignant neoplasm of breast: Secondary | ICD-10-CM

## 2020-12-06 ENCOUNTER — Encounter (HOSPITAL_COMMUNITY): Payer: Self-pay

## 2020-12-06 ENCOUNTER — Ambulatory Visit (HOSPITAL_COMMUNITY): Payer: PRIVATE HEALTH INSURANCE

## 2020-12-06 ENCOUNTER — Other Ambulatory Visit: Payer: Self-pay

## 2020-12-06 ENCOUNTER — Ambulatory Visit (HOSPITAL_COMMUNITY)
Admission: RE | Admit: 2020-12-06 | Discharge: 2020-12-06 | Disposition: A | Discharge status: Home / Self Care | Payer: No Typology Code available for payment source | Source: Ambulatory Visit | Attending: Family Medicine | Admitting: Family Medicine

## 2020-12-06 ENCOUNTER — Other Ambulatory Visit (HOSPITAL_COMMUNITY): Payer: Self-pay | Admitting: Family Medicine

## 2020-12-06 DIAGNOSIS — M6200 Separation of muscle (nontraumatic), unspecified site: Secondary | ICD-10-CM

## 2020-12-06 DIAGNOSIS — R109 Unspecified abdominal pain: Secondary | ICD-10-CM

## 2020-12-06 LAB — POCT I-STAT CREATININE: Creatinine, Ser: 0.7 mg/dL (ref 0.44–1.00)

## 2020-12-06 IMAGING — CT CT ABDOMEN W/ CM
3 of 5 series · 16 of 46 positions shown, 18 images · IV contrast (omnipaque)
Comparison: CT abdomen [DATE].

CLINICAL DATA: Acute mid abdominal pain, possible hernia. Remote
history of gallbladder surgery.

EXAM:
CT ABDOMEN WITH CONTRAST
TECHNIQUE: Multidetector CT imaging of the abdomen was performed using the
standard protocol following bolus administration of intravenous
contrast.
CONTRAST:  100mL OMNIPAQUE IOHEXOL 300 MG/ML  SOLN

[Series 2: axial st · axial · 0.73mm/px · z∈[-458,-208]mm · 11 of 62 slices shown, 13 images]
[im 6/62  soft-tissue]
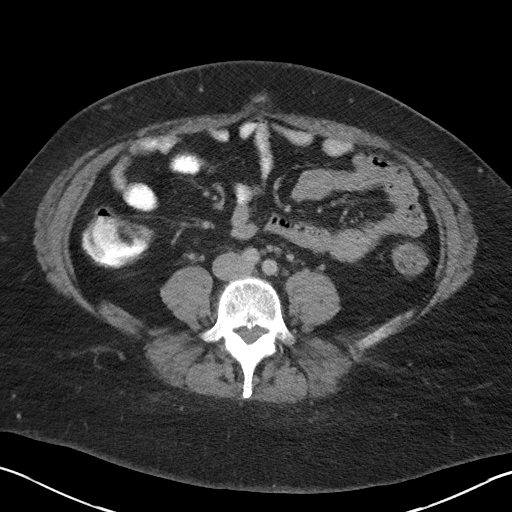
[im 6/62  bone]
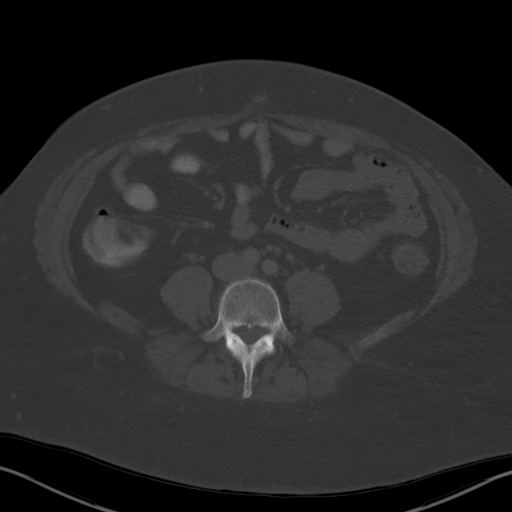
[im 11/62  soft-tissue]
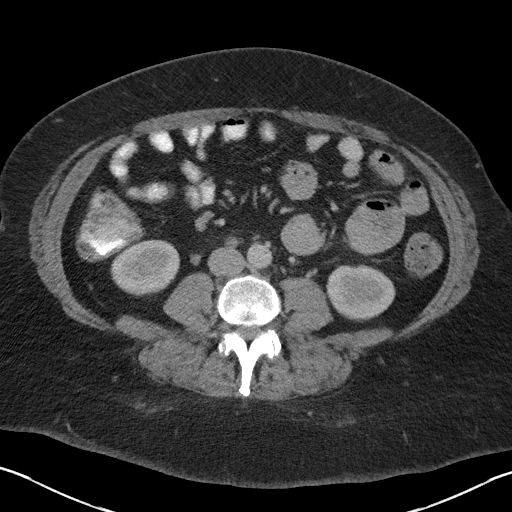
[im 16/62  soft-tissue]
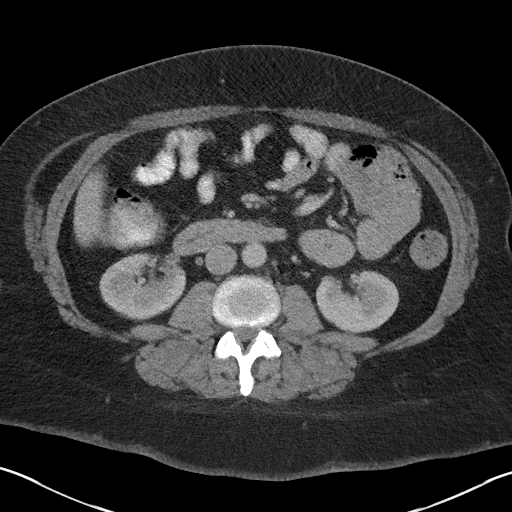
[im 21/62  soft-tissue]
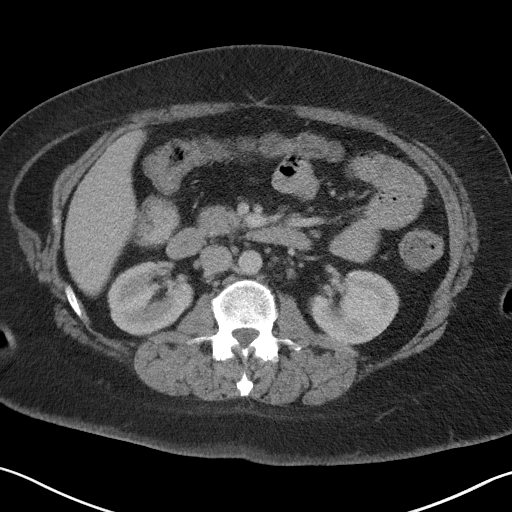
[im 26/62  soft-tissue]
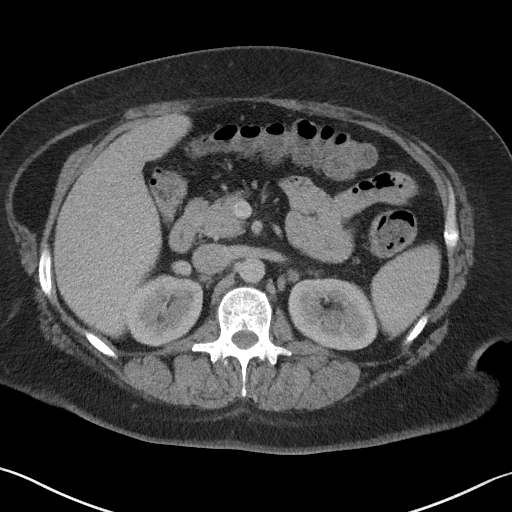
[im 31/62  soft-tissue]
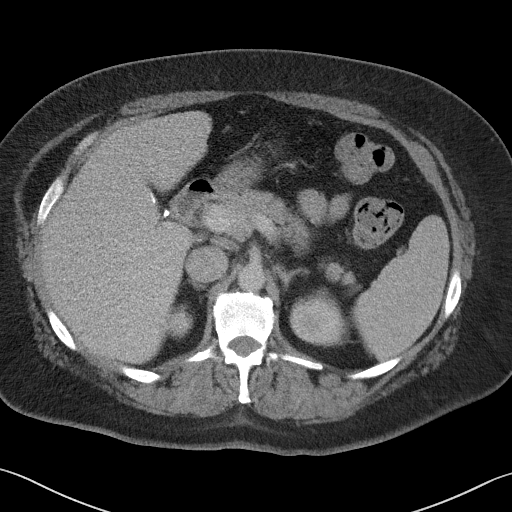
[im 36/62  soft-tissue]
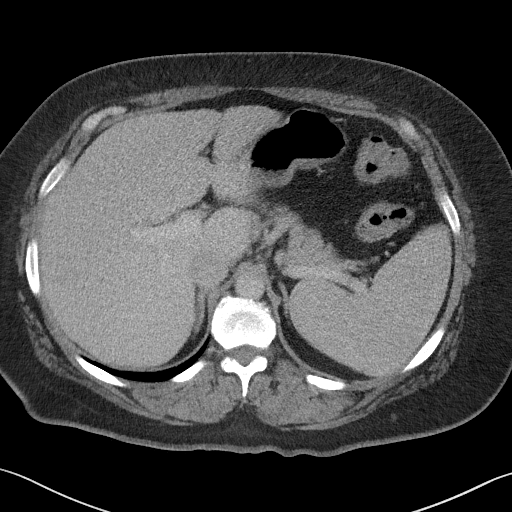
[im 41/62  soft-tissue]
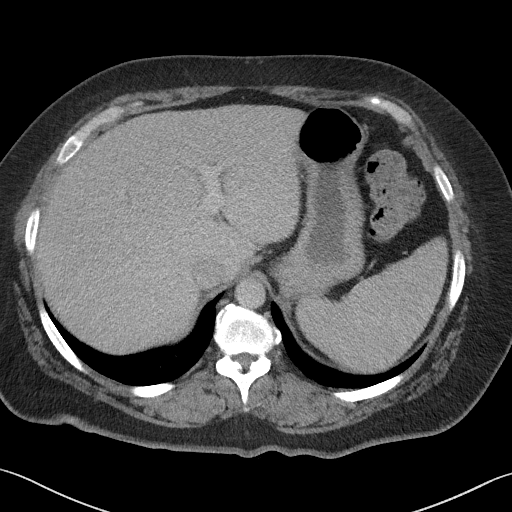
[im 46/62  soft-tissue]
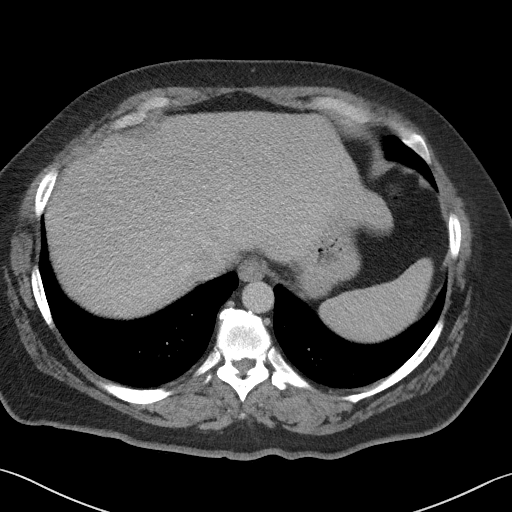
[im 46/62  bone]
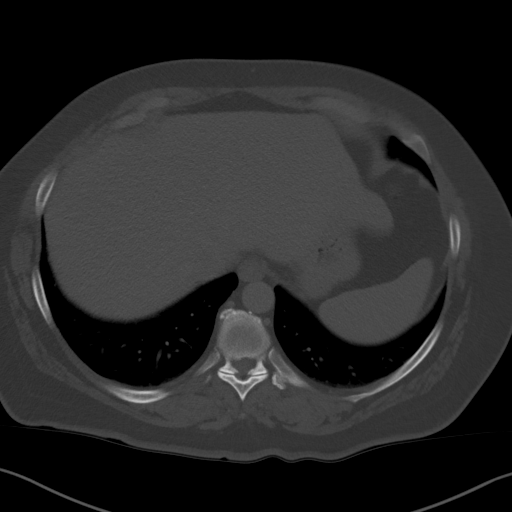
[im 51/62  soft-tissue]
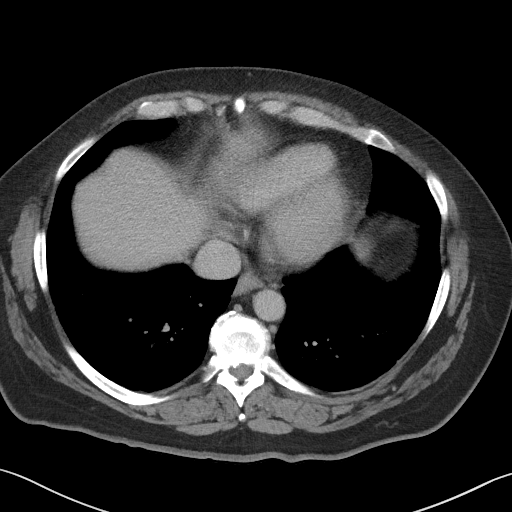
[im 56/62  soft-tissue]
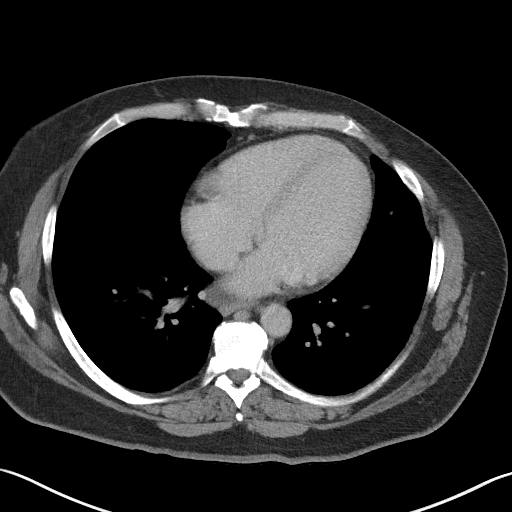

[Series 5: lung bases · axial · 0.73mm/px · z∈[-308,-283]mm · 2 of 32 slices shown]
[im 6/32  bone]
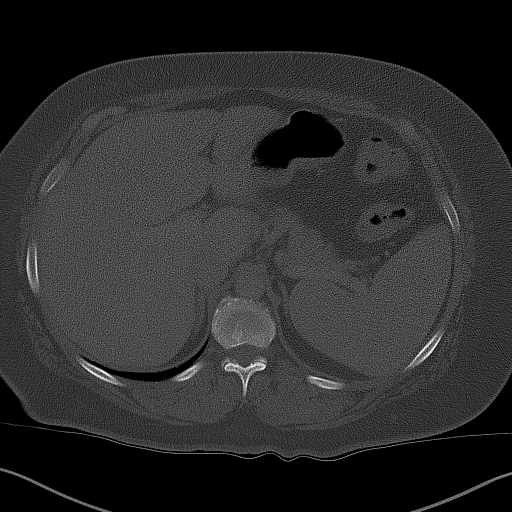
[im 11/32  bone]
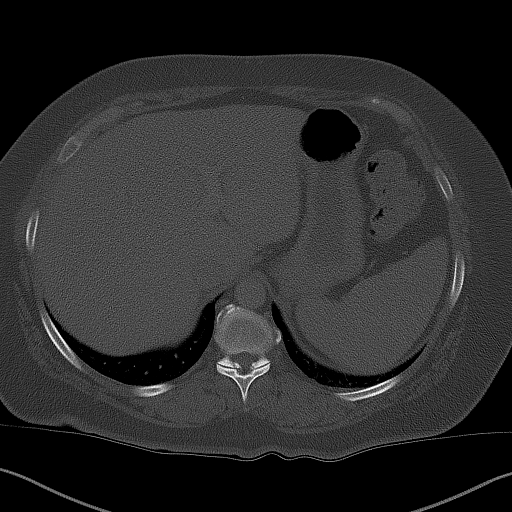

[Series 6: coronal st · coronal · 0.62mm/px · 3 of 115 slices shown]
[im 39/115  soft-tissue]
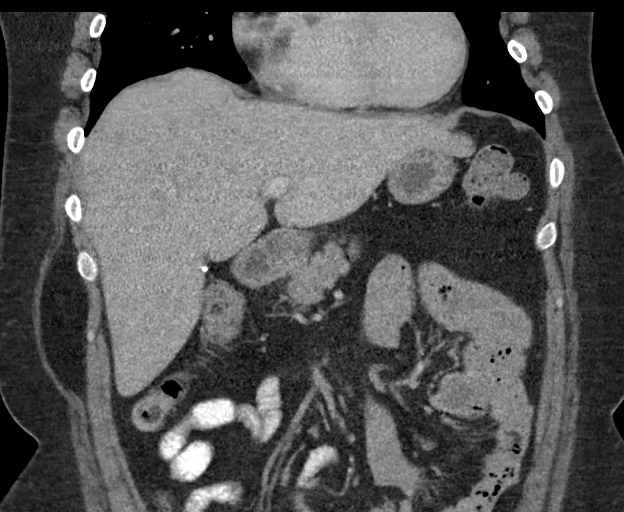
[im 51/115  soft-tissue]
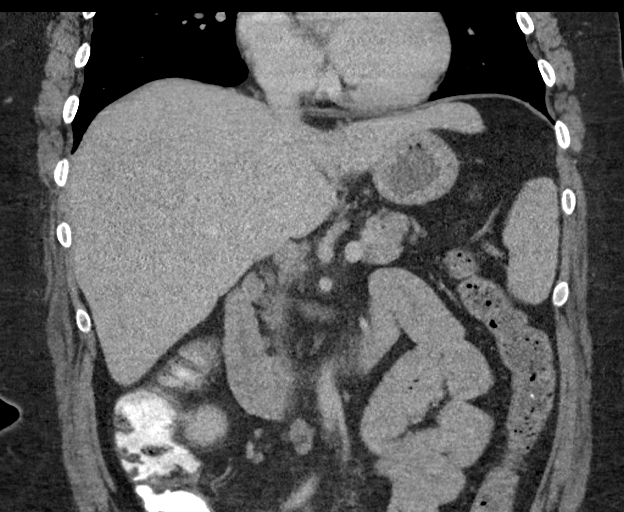
[im 64/115  soft-tissue]
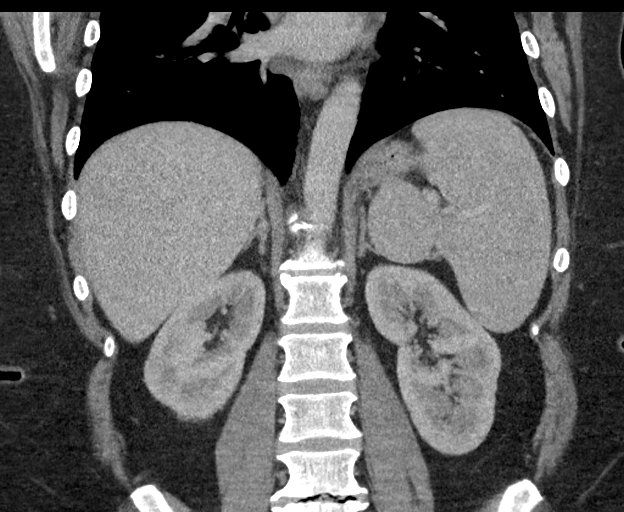

[16 of 46 positions shown; findings below may reference images not displayed]

FINDINGS: Lower chest: No acute abnormality. Normal size heart. No pericardial
effusion.

Hepatobiliary: No suspicious hepatic lesion. Cholecystectomy clips.
No biliary ductal dilatation.

Pancreas: Unremarkable. No pancreatic ductal dilatation or
surrounding inflammatory changes.

Spleen: Normal in size without focal abnormality.

Adrenals/Urinary Tract: Adrenal glands are unremarkable. Kidneys are
normal, without renal calculi, focal lesion, or hydronephrosis.
Bladder is unremarkable.

Stomach/Bowel: Stomach is within normal limits. Visualized portions
of the small large bowel appear normal. No evidence of bowel wall
thickening, distention, or inflammatory changes.

Vascular/Lymphatic: Prominent periportal and retroperitoneal lymph
nodes without adenopathy by size criteria, similar to prior. No
significant vascular finding present.

Other: No abdominal wall hernia. Unchanged appearance of the lipoma
in the right oblique musculature. No abdominal ascites.

Musculoskeletal: Multilevel degenerative changes spine. No acute
osseous abnormality.
IMPRESSION: 1. No acute abdominal findings.
2. No abdominal wall hernia.
3. Unchanged appearance of the lipoma in the right oblique
musculature.

## 2020-12-06 MED ORDER — IOHEXOL 9 MG/ML PO SOLN
ORAL | Status: AC
  Filled 2020-12-06: qty 500

## 2020-12-06 MED ORDER — IOHEXOL 300 MG/ML  SOLN
100.0000 mL | Freq: Once | INTRAMUSCULAR | Status: AC | PRN
  Administered 2020-12-06: 100 mL via INTRAVENOUS

## 2020-12-27 ENCOUNTER — Ambulatory Visit (HOSPITAL_COMMUNITY): Payer: PRIVATE HEALTH INSURANCE

## 2020-12-30 ENCOUNTER — Ambulatory Visit (HOSPITAL_COMMUNITY)
Admission: RE | Admit: 2020-12-30 | Discharge: 2020-12-30 | Disposition: A | Discharge status: Home / Self Care | Payer: PRIVATE HEALTH INSURANCE | Source: Ambulatory Visit | Attending: Family Medicine | Admitting: Family Medicine

## 2020-12-30 ENCOUNTER — Other Ambulatory Visit: Payer: Self-pay

## 2020-12-30 DIAGNOSIS — Z1231 Encounter for screening mammogram for malignant neoplasm of breast: Secondary | ICD-10-CM

## 2020-12-30 IMAGING — MG MM DIGITAL SCREENING BILAT W/ TOMO AND CAD
8 series · 9 of 24 positions shown · non-contrast
Comparison: Previous exam(s).

CLINICAL DATA: Screening.

EXAM:
DIGITAL SCREENING BILATERAL MAMMOGRAM WITH TOMOSYNTHESIS AND CAD
TECHNIQUE: Bilateral screening digital craniocaudal and mediolateral oblique
mammograms were obtained. Bilateral screening digital breast
tomosynthesis was performed. The images were evaluated with
computer-aided detection.

[L MLO synth-2D]
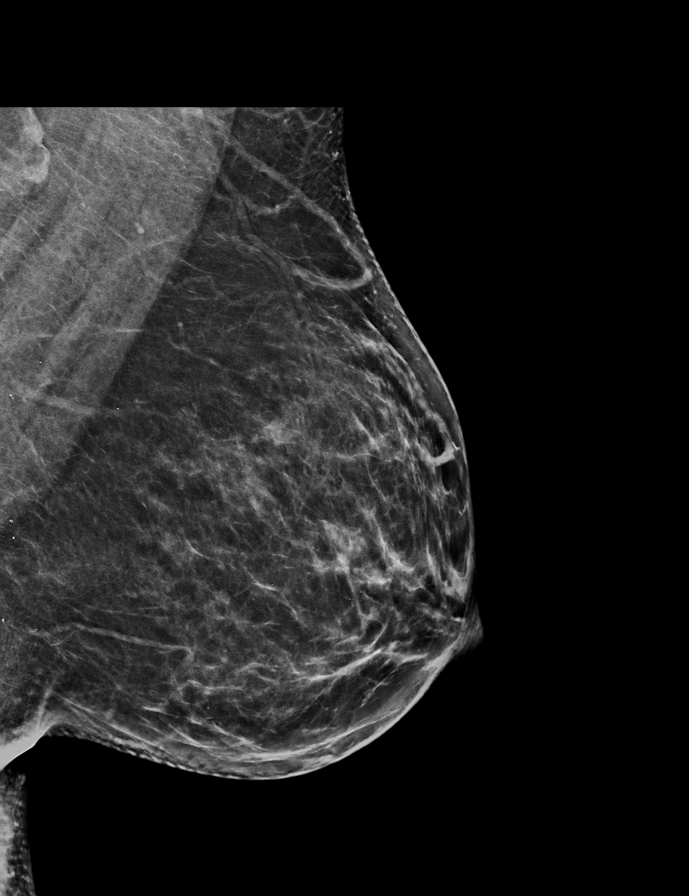

[R CC synth-2D]
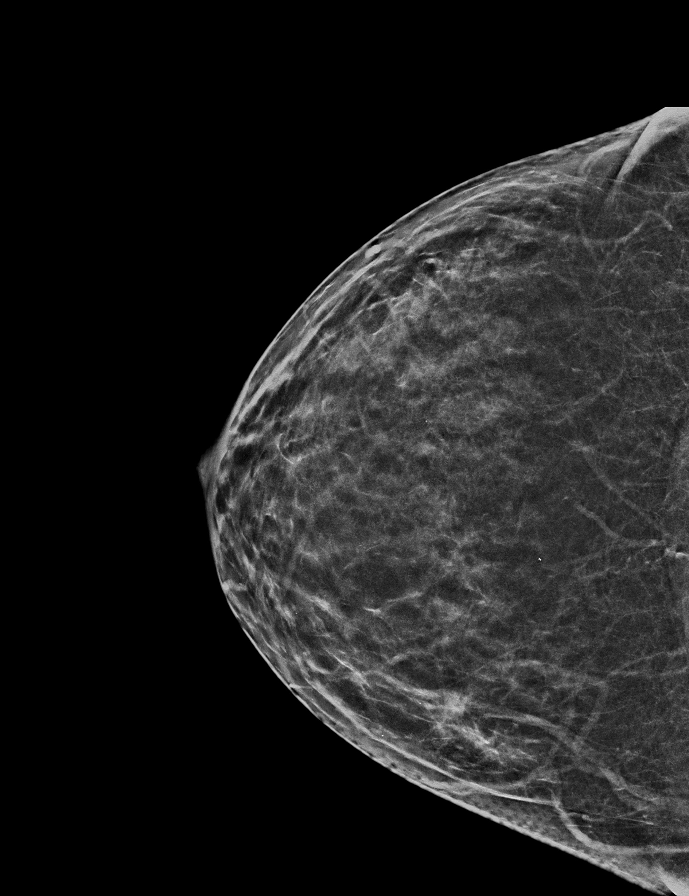

[R MLO synth-2D]
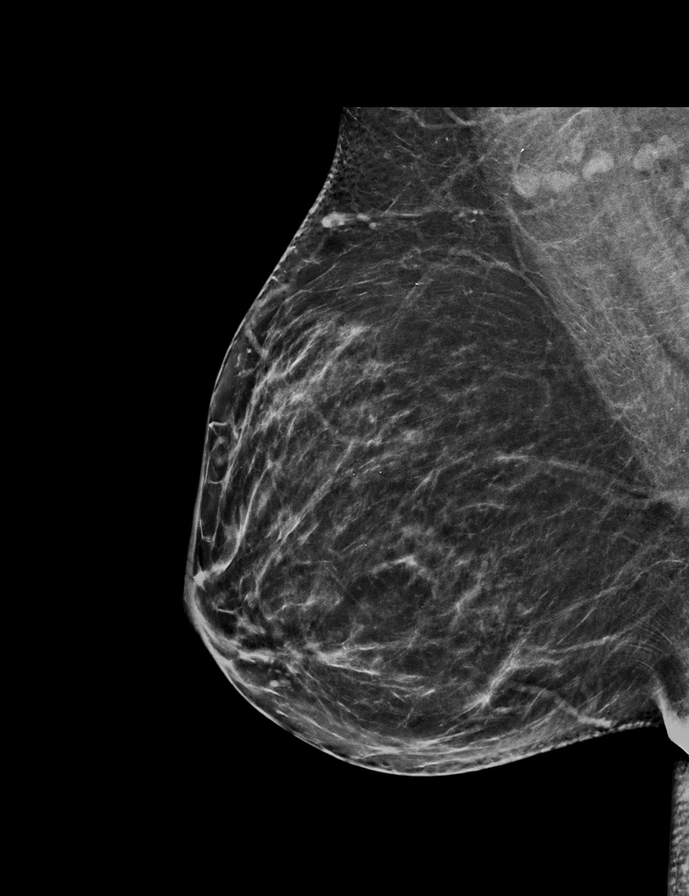

[L CC synth-2D]
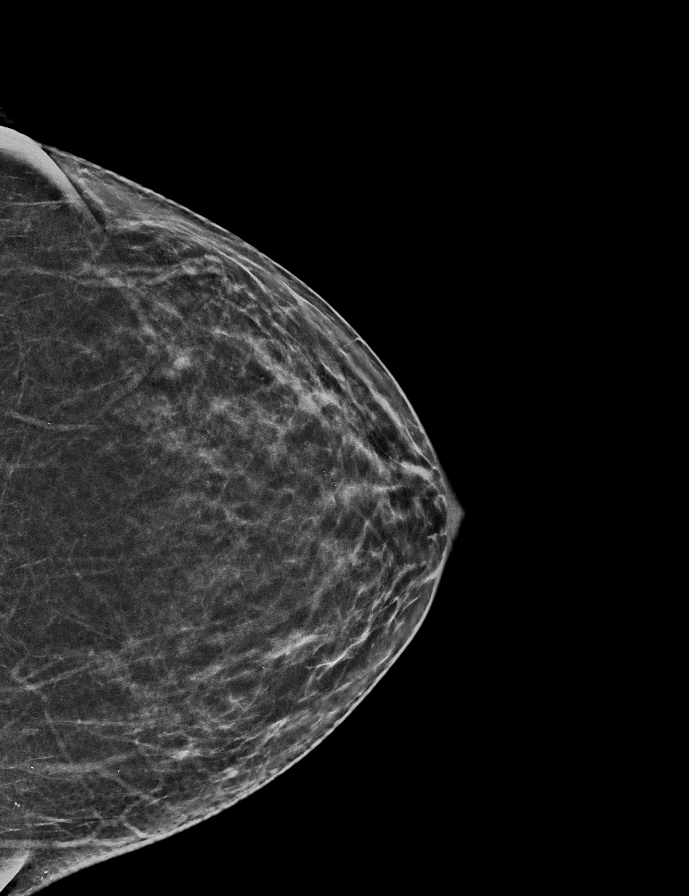

[R MLO tomo · 2 of 67 frames shown]
[frame 22/67]
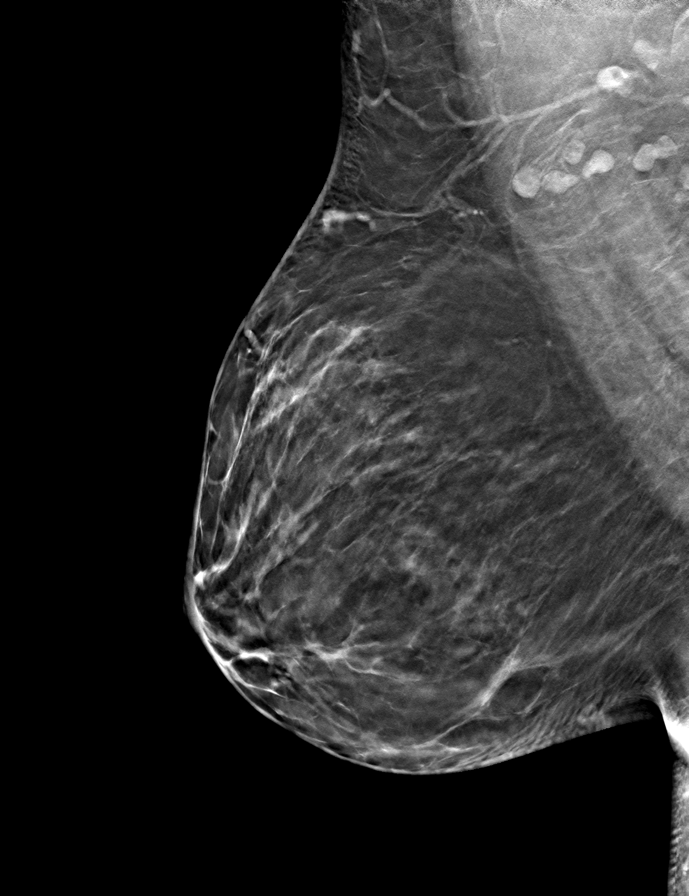
[frame 34/67]
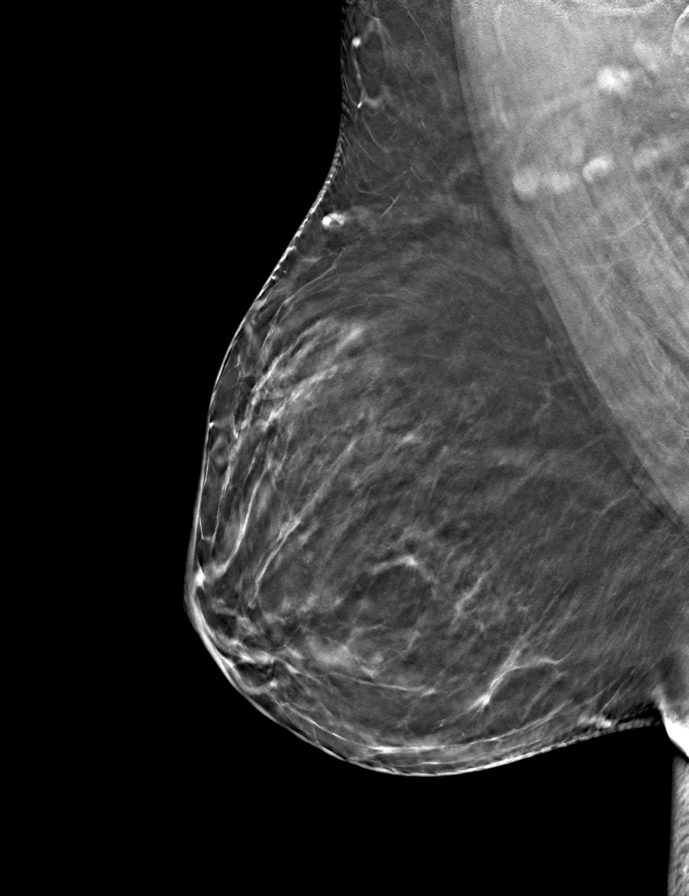

[R CC tomo · tomo slice 29/57.0]
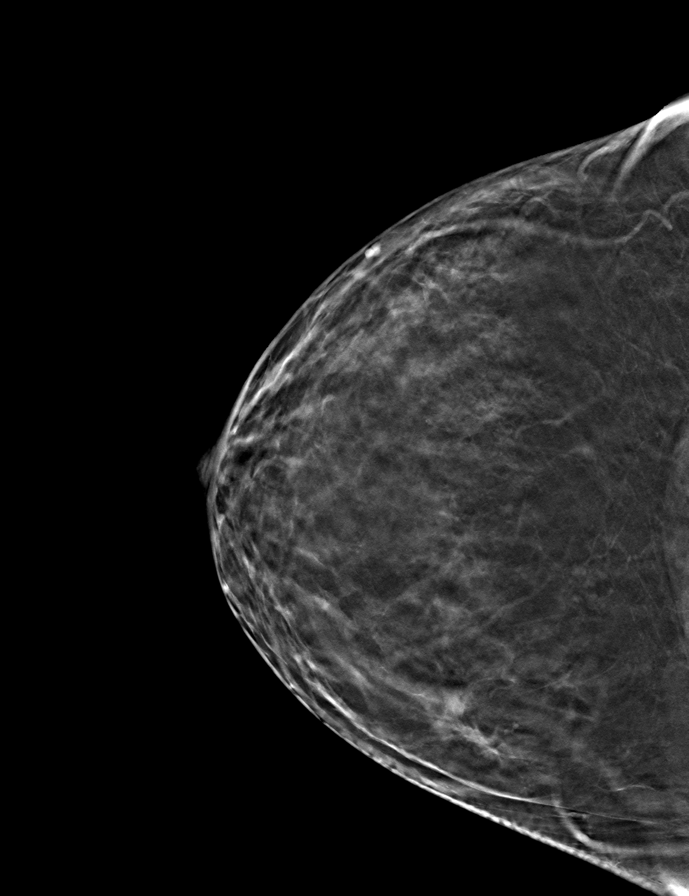

[L CC tomo · tomo slice 29/58.0]
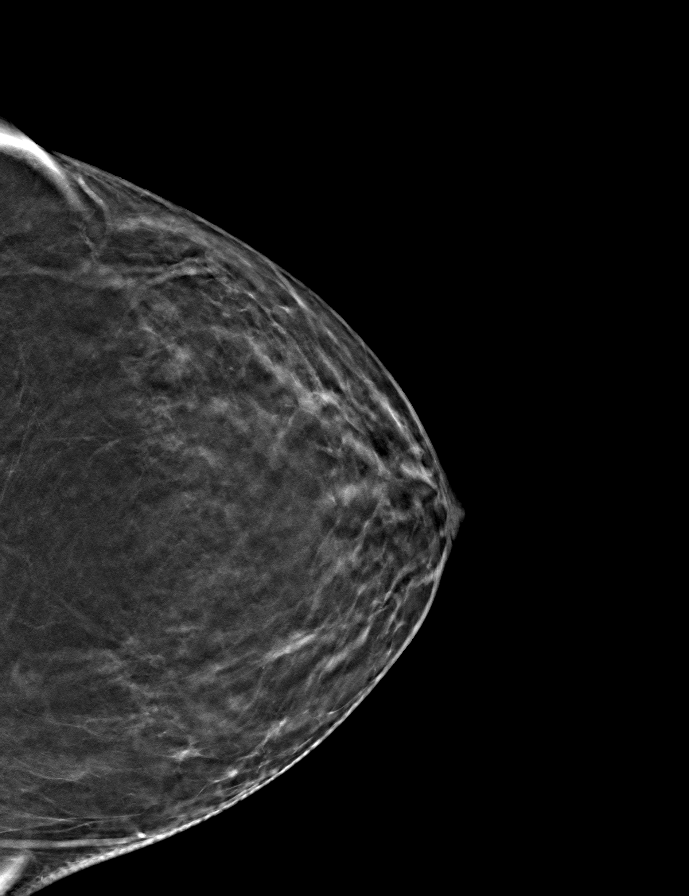

[L MLO tomo · tomo slice 35/69.0]
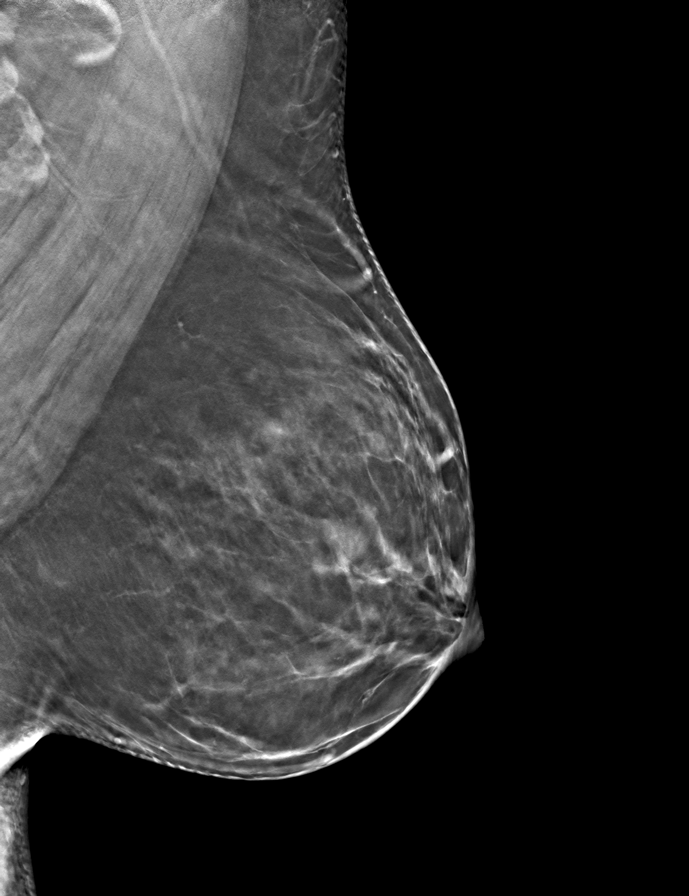

[9 of 24 positions shown; findings below may reference images not displayed]

ACR Breast Density Category c: The breast tissue is heterogeneously
dense, which may obscure small masses.
FINDINGS: There are no findings suspicious for malignancy. The images were
evaluated with computer-aided detection.
IMPRESSION: No mammographic evidence of malignancy. A result letter of this
screening mammogram will be mailed directly to the patient.

RECOMMENDATION:
Screening mammogram in one year. (Code:[6J])

BI-RADS CATEGORY  1: Negative.

## 2021-05-02 ENCOUNTER — Other Ambulatory Visit: Payer: Self-pay | Admitting: Family Medicine

## 2021-05-02 ENCOUNTER — Ambulatory Visit (HOSPITAL_COMMUNITY)
Admission: RE | Admit: 2021-05-02 | Discharge: 2021-05-02 | Disposition: A | Discharge status: Home / Self Care | Payer: 59 | Source: Ambulatory Visit | Attending: Family Medicine | Admitting: Family Medicine

## 2021-05-02 ENCOUNTER — Other Ambulatory Visit (HOSPITAL_COMMUNITY): Payer: Self-pay | Admitting: Family Medicine

## 2021-05-02 ENCOUNTER — Other Ambulatory Visit: Payer: Self-pay

## 2021-05-02 DIAGNOSIS — M79605 Pain in left leg: Secondary | ICD-10-CM | POA: Insufficient documentation

## 2021-06-13 ENCOUNTER — Telehealth: Payer: Self-pay | Admitting: Adult Health

## 2021-06-13 MED ORDER — MEGESTROL ACETATE 40 MG PO TABS: ORAL_TABLET | ORAL | 3 refills | Status: DC

## 2021-06-13 NOTE — Telephone Encounter (Signed)
Alexandra Shaffer is bleeding heavy for 5 days now, is on eliquis and has used megace in the past, and requests refill. Will rx megace, and let me know if not better by end of the week. She is moving to [place] in September

## 2021-06-13 NOTE — Telephone Encounter (Signed)
Patient wanted to see if Victorino Dike would give her some more pills to stop her period. She said she won't stop bleeding and she is on blood thinners.

## 2021-06-16 ENCOUNTER — Telehealth: Payer: Self-pay | Admitting: Adult Health

## 2021-06-16 NOTE — Telephone Encounter (Signed)
Patient calling stating that she is still bleeding that the medline is not helping wanting to know what is the next steps

## 2021-06-16 NOTE — Telephone Encounter (Signed)
Returned pt's call for clarification. Pt stated that she was on day 3 of the initial prescription phase and the bleeding had become lighter, but had not stopped. She was instructed to continue the medication as prescribed and contact the office if the bleeding continued or increased after getting to the once daily tablet phase. She was instructed to take Advil and Tylenol for the ovary pain. Pt confirmed understanding.

## 2021-09-09 DIAGNOSIS — R5383 Other fatigue: Secondary | ICD-10-CM | POA: Diagnosis not present

## 2021-09-09 DIAGNOSIS — E782 Mixed hyperlipidemia: Secondary | ICD-10-CM | POA: Diagnosis not present

## 2021-09-09 DIAGNOSIS — Z780 Asymptomatic menopausal state: Secondary | ICD-10-CM | POA: Diagnosis not present

## 2021-09-09 DIAGNOSIS — K219 Gastro-esophageal reflux disease without esophagitis: Secondary | ICD-10-CM | POA: Diagnosis not present

## 2021-09-09 DIAGNOSIS — F321 Major depressive disorder, single episode, moderate: Secondary | ICD-10-CM | POA: Diagnosis not present

## 2021-09-09 DIAGNOSIS — F419 Anxiety disorder, unspecified: Secondary | ICD-10-CM | POA: Diagnosis not present

## 2021-09-09 DIAGNOSIS — I824Z2 Acute embolism and thrombosis of unspecified deep veins of left distal lower extremity: Secondary | ICD-10-CM | POA: Diagnosis not present

## 2021-09-09 DIAGNOSIS — Z6841 Body Mass Index (BMI) 40.0 and over, adult: Secondary | ICD-10-CM | POA: Diagnosis not present

## 2021-09-09 DIAGNOSIS — J302 Other seasonal allergic rhinitis: Secondary | ICD-10-CM | POA: Diagnosis not present

## 2021-09-22 DIAGNOSIS — B349 Viral infection, unspecified: Secondary | ICD-10-CM | POA: Diagnosis not present

## 2021-09-22 DIAGNOSIS — J22 Unspecified acute lower respiratory infection: Secondary | ICD-10-CM | POA: Diagnosis not present

## 2021-12-26 ENCOUNTER — Ambulatory Visit (INDEPENDENT_AMBULATORY_CARE_PROVIDER_SITE_OTHER): Payer: 59 | Admitting: Orthopedic Surgery

## 2021-12-26 ENCOUNTER — Other Ambulatory Visit: Payer: Self-pay

## 2021-12-26 ENCOUNTER — Ambulatory Visit: Payer: 59

## 2021-12-26 ENCOUNTER — Encounter: Payer: Self-pay | Admitting: Orthopedic Surgery

## 2021-12-26 VITALS — BP 133/82 | HR 77 | Ht 69.0 in | Wt 279.2 lb

## 2021-12-26 DIAGNOSIS — G8929 Other chronic pain: Secondary | ICD-10-CM

## 2021-12-26 DIAGNOSIS — M25561 Pain in right knee: Secondary | ICD-10-CM

## 2021-12-26 DIAGNOSIS — S83241A Other tear of medial meniscus, current injury, right knee, initial encounter: Secondary | ICD-10-CM

## 2021-12-26 NOTE — Patient Instructions (Signed)
While we are working on your approval for MRI please go ahead and call to schedule your appointment with [facility] within at least 2 weeks ? ?Central Scheduling ?[phone]  ?

## 2021-12-26 NOTE — Progress Notes (Signed)
Chief Complaint  ?Patient presents with  ? Knee Pain  ?  Right, chronic had a twisting injury heard a pop DOI 06/5712  ? ?51 year old female last September was moving a sandbag preparation for her cane twisted her knee felt a pop complains of medial knee pain and loss of range of motion giving way ? ?She took over-the-counter medications ice tried to deal with it on her own no prior treatment ? ?Review of systems negative had left knee arthroscopy for similar problem a year or 2 ago ? ?Exam right knee cannot extend the knee has pain with attempted extension flexes to about 110 degrees then has pain small effusion medial joint line tenderness ligaments stable neurovascular exam intact ? ?Past Medical History:  ?Diagnosis Date  ? Anxiety   ? Depression   ? Hot flashes 11/26/2014  ? Mood swings 11/26/2014  ? ?Past Surgical History:  ?Procedure Laterality Date  ? CHOLECYSTECTOMY    ? KNEE SURGERY Left   ? Arthroscopy  ? ? ? ?Current Outpatient Medications:  ?  acetaminophen (TYLENOL) 500 MG tablet, Take 1,000 mg by mouth every 6 (six) hours as needed for mild pain or headache., Disp: , Rfl:  ?  ALPRAZolam (XANAX) 1 MG tablet, Take 0.5-1 mg by mouth 2 (two) times daily as needed for anxiety. , Disp: , Rfl:  ?  celecoxib (CELEBREX) 200 MG capsule, Take 200 mg by mouth 2 (two) times daily., Disp: , Rfl:  ?  DULoxetine (CYMBALTA) 60 MG capsule, Take 60 mg by mouth daily., Disp: , Rfl:  ?  ibuprofen (ADVIL,MOTRIN) 200 MG tablet, Take 600 mg by mouth 2 (two) times daily as needed for cramping., Disp: , Rfl:  ? ?X-rays ?Patellofemoral arthritis mild there are some osteophytes in the lateral facet area tibiofemoral alignment is about 4 degrees ? ?Encounter Diagnoses  ?Name Primary?  ? Chronic pain of right knee Yes  ? Acute medial meniscus tear of right knee, initial encounter   ? ?MRI right knee ? ? ? ? ?

## 2021-12-27 ENCOUNTER — Encounter: Payer: Self-pay | Admitting: Orthopedic Surgery

## 2022-01-11 ENCOUNTER — Ambulatory Visit (HOSPITAL_COMMUNITY)
Admission: RE | Admit: 2022-01-11 | Discharge: 2022-01-11 | Disposition: A | Discharge status: Home / Self Care | Payer: 59 | Source: Ambulatory Visit | Attending: Orthopedic Surgery | Admitting: Orthopedic Surgery

## 2022-01-11 ENCOUNTER — Other Ambulatory Visit: Payer: Self-pay

## 2022-01-11 DIAGNOSIS — S83241A Other tear of medial meniscus, current injury, right knee, initial encounter: Secondary | ICD-10-CM | POA: Diagnosis present

## 2022-01-11 IMAGING — MR MR KNEE*R* W/O CM
7 series · 40 of 40 positions shown · non-contrast
Comparison: None.

CLINICAL DATA: Acute right knee pain

EXAM:
MRI OF THE RIGHT KNEE WITHOUT CONTRAST
TECHNIQUE: Multiplanar, multisequence MR imaging of the knee was performed. No
intravenous contrast was administered.

[Series 5: T2 fat-sat · axial · right · 4.0mm · 0.56mm/px · z∈[-39,+86]mm · 6 of 26 slices shown (1 of 3)]
[im 1/26]
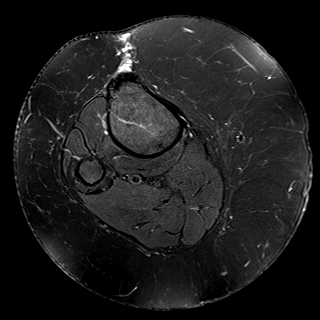
[im 6/26]
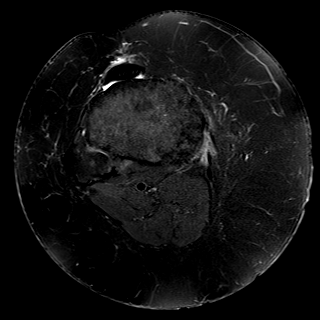
[im 11/26]
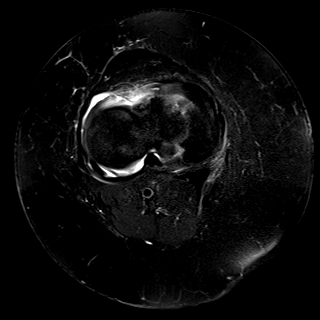
[im 16/26]
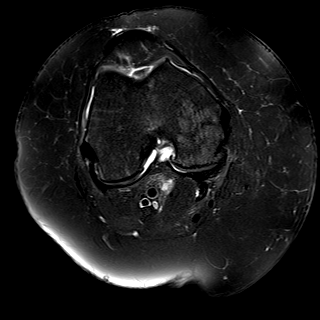
[im 21/26]
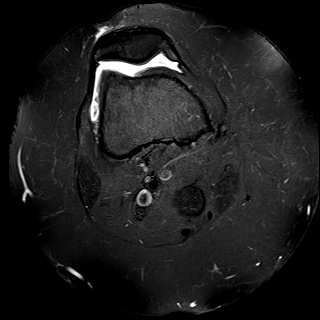
[im 26/26]
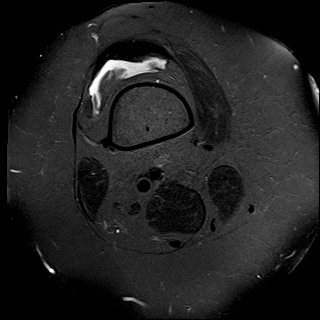

[Series 6: T1 · coronal · right · 4.0mm · 0.59mm/px · 5 of 24 slices shown]
[im 1/24]
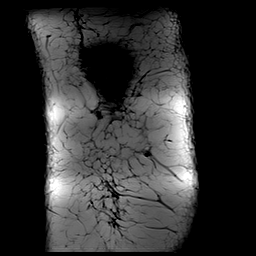
[im 6/24]
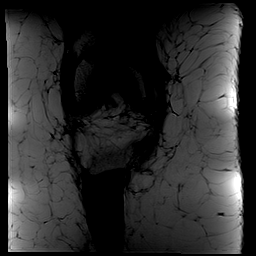
[im 12/24]
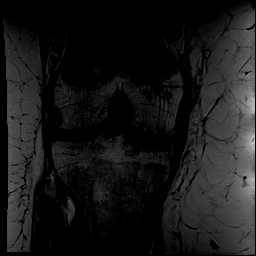
[im 18/24]
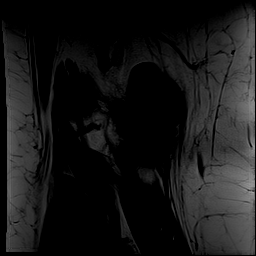
[im 24/24]
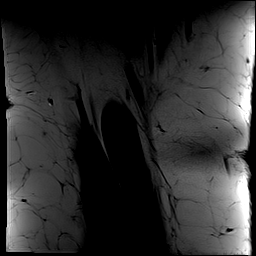

[Series 7: T2 fat-sat · coronal · right · 4.0mm · 0.59mm/px · 5 of 24 slices shown (2 of 3)]
[im 1/24]
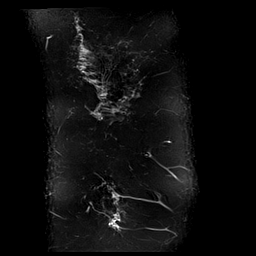
[im 6/24]
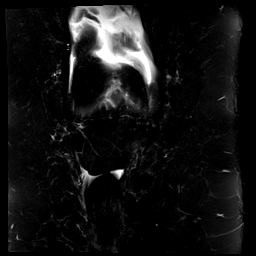
[im 12/24]
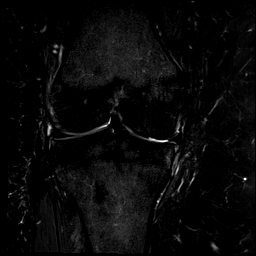
[im 18/24]
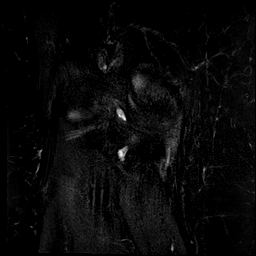
[im 24/24]
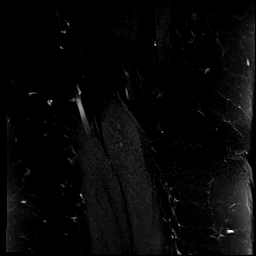

[Series 8: PD fat-sat · coronal · right · 3.0mm · 0.59mm/px · 8 of 40 slices shown (1 of 2)]
[im 1/40]
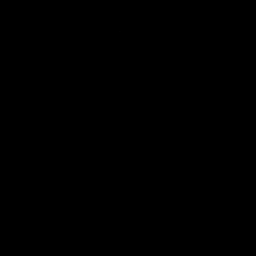
[im 6/40]
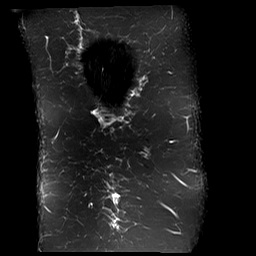
[im 12/40]
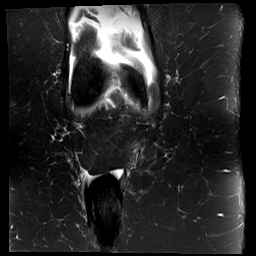
[im 17/40]
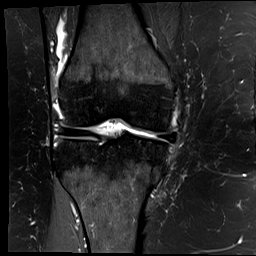
[im 23/40]
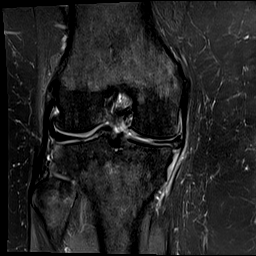
[im 28/40]
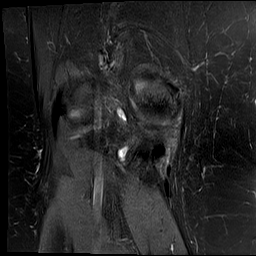
[im 34/40]
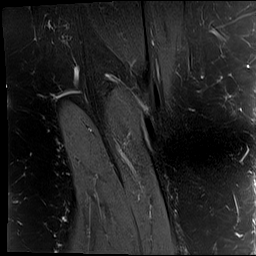
[im 40/40]
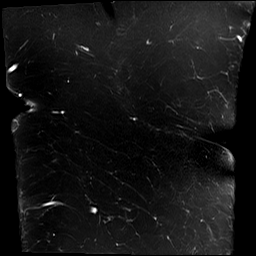

[Series 9: PD fat-sat · sagittal · right · 3.0mm · 0.59mm/px · 6 of 28 slices shown (2 of 2)]
[im 1/28]
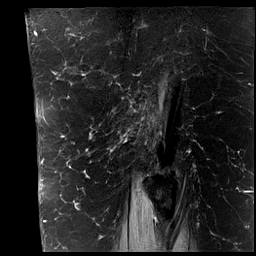
[im 6/28]
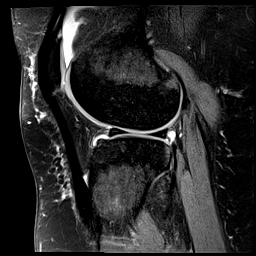
[im 11/28]
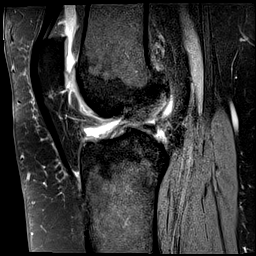
[im 17/28]
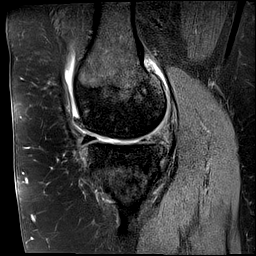
[im 22/28]
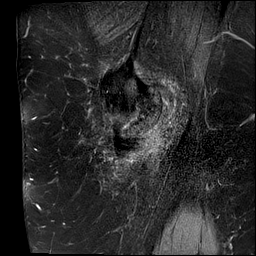
[im 28/28]
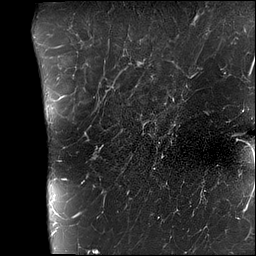

[Series 10: T2 fat-sat · sagittal · right · 3.0mm · 0.59mm/px · 6 of 28 slices shown (3 of 3)]
[im 1/28]
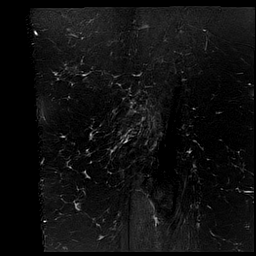
[im 6/28]
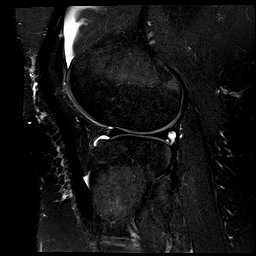
[im 11/28]
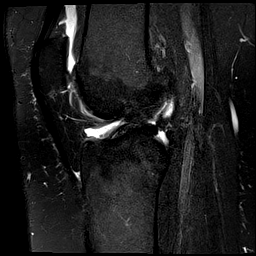
[im 17/28]
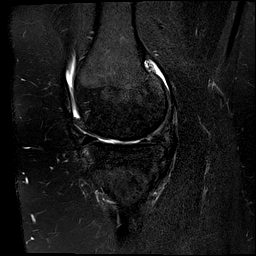
[im 22/28]
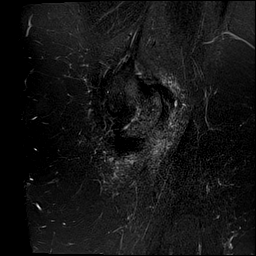
[im 28/28]
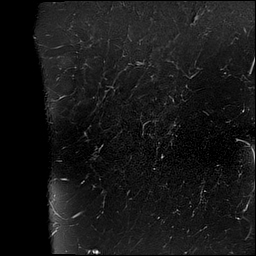

[Series 11: PD · coronal · right · 2.0mm · 0.47mm/px · 4 of 20 slices shown]
[im 1/20]
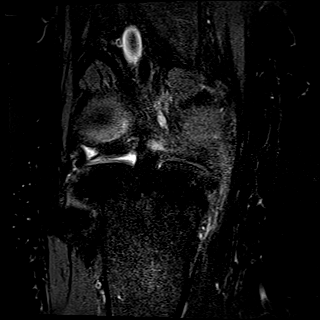
[im 7/20]
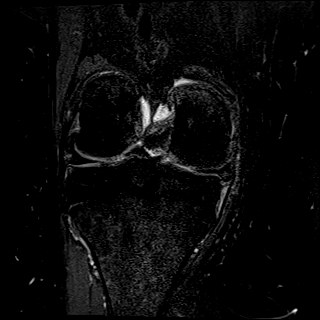
[im 13/20]
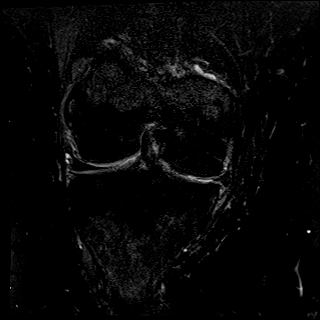
[im 20/20]
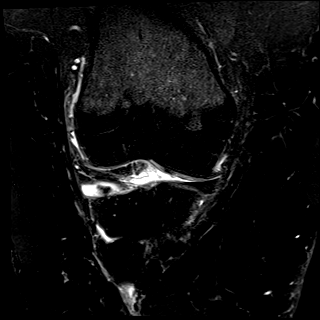

[40 of 40 positions shown; findings below may reference images not displayed]

FINDINGS: MENISCI

Medial: Radial tear of the posterior horn of the medial meniscus
with peripheral meniscal extrusion. Degeneration of the body of the
medial meniscus.

Lateral: Intact

LIGAMENTS

Cruciates: ACL and PCL are intact.

Collaterals: Medial collateral ligament is intact. Lateral
collateral ligament complex is intact.

CARTILAGE

Patellofemoral: Partial-thickness cartilage loss of the patellar
apex extending into the medial and lateral patellar facets.
Partial-thickness cartilage loss of the trochlea.

Medial: High-grade partial-thickness cartilage loss with areas of
full-thickness cartilage loss of the medial femorotibial
compartment.

Lateral: Mild partial-thickness cartilage loss of the lateral
femorotibial compartment.

JOINT: Moderate joint effusion. Normal MADSEN. No plical
thickening. Small ganglion cyst along the posterior aspect of the
joint capsule.

POPLITEAL FOSSA: Popliteus tendon is intact. No Baker's cyst.

EXTENSOR MECHANISM: Intact quadriceps tendon. Intact patellar
tendon. Intact lateral patellar retinaculum. Intact medial patellar
retinaculum. Intact MPFL.

BONES: No aggressive osseous lesion. No fracture or dislocation.

Other: No fluid collection or hematoma. Muscles are normal.
IMPRESSION: 1. Radial tear of the posterior horn of the medial meniscus with
peripheral meniscal extrusion. Degeneration of the body of the
medial meniscus.
2. Tricompartmental cartilage abnormalities as described above most
severe in the medial femorotibial compartment.
3. Moderate joint effusion.

## 2022-01-12 ENCOUNTER — Ambulatory Visit: Payer: 59 | Admitting: Orthopedic Surgery

## 2022-01-17 ENCOUNTER — Telehealth: Payer: Self-pay | Admitting: Orthopedic Surgery

## 2022-01-17 NOTE — Telephone Encounter (Signed)
same

## 2022-01-17 NOTE — Telephone Encounter (Signed)
I called Alexandra Shaffer discussed her MRI results she has arthritis all 3 compartments some areas high-grade some partial-thickness she also has a medial meniscus tear ? ?Her arthritis is grade 1 on x-ray she is 50 she is 279 pounds she cannot have knee replacement at this point ? ?She wants to have something done because of the pain that she has not so we decided to proceed with arthroscopy right knee partial medial meniscectomy and deal with the arthritis best we can ?

## 2022-01-18 ENCOUNTER — Telehealth: Payer: Self-pay

## 2022-01-18 ENCOUNTER — Other Ambulatory Visit: Payer: Self-pay

## 2022-01-18 DIAGNOSIS — S83241A Other tear of medial meniscus, current injury, right knee, initial encounter: Secondary | ICD-10-CM

## 2022-01-18 DIAGNOSIS — Z01818 Encounter for other preprocedural examination: Secondary | ICD-10-CM

## 2022-01-18 NOTE — Telephone Encounter (Signed)
-----   Message from Vickki Hearing, MD sent at 01/17/2022  3:53 PM EDT ----- ?Schedule for right knee arthroscopy and medial menisectomy  ? ?For April 13 (a Thurs, am dr Dallas Schimke is out )  ?----- Message ----- ?From: Interface, Rad Results In ?Sent: 01/13/2022   6:38 PM EDT ?To: Vickki Hearing, MD ? ? ?

## 2022-01-18 NOTE — Telephone Encounter (Signed)
Patient advised of surgery scheduled for 02/02/22. I advised her that we would precert her insurance and [facility] would call her to discuss her pre-op testing appt. She verbalizes understanding and is in agreement of treatment plan.  ?

## 2022-01-25 NOTE — Patient Instructions (Signed)
? ? ? ? ? ? ? Alexandra Shaffer ? 01/25/2022  ?  ? @PREFPERIOPPHARMACY @ ? ? Your procedure is scheduled on  02/02/2022. ? ? Report to 02/04/2022 at  0700 A.M. ? ? Call this number if you have problems the morning of surgery: ? [phone] ? ? Remember: ? Do not eat after midnight. ? ? ? You may drink clear liquids until  0500 AM on 02/02/2022.  ? ? ?  Clear liquids allowed are:                    Water, Juice (non-citric and without pulp - diabetics please choose diet or no sugar options), Carbonated beverages - (diabetics please choose diet or no sugar options), Clear Tea, Black Coffee only (no creamer, milk or cream including half and half), Plain Jell-O only (diabetics please choose diet or no sugar options), Gatorade (diabetics please choose diet or no sugar options), and Plain Popsicles only ? ?At 0500 AM on 02/02/2022, drink your carb drink. You can have nothing else to drink after this ?  ? ? Take these medicines the morning of surgery with A SIP OF WATER  ? ?Xanax(if needed), celebrex, cymbalta. ? ?  ? Do not wear jewelry, make-up or nail polish. ? Do not wear lotions, powders, or perfumes, or deodorant. ? Do not shave 48 hours prior to surgery.  Men may shave face and neck. ? Do not bring valuables to the hospital. ? [facility] is not responsible for any belongings or valuables. ? ?Contacts, dentures or bridgework may not be worn into surgery.  Leave your suitcase in the car.  After surgery it may be brought to your room. ? ?For patients admitted to the hospital, discharge time will be determined by your treatment team. ? ?Patients discharged the day of surgery will not be allowed to drive home and must have someone with them for 24 hours.  ? ? ?Special instructions:   DO NOT smoke tobacco or vapef or 24 hours before your procedure. ? ?Please read over the following fact sheets that you were given. ?Coughing and Deep Breathing, Surgical Site Infection Prevention, Anesthesia Post-op Instructions, and Care and  Recovery After Surgery ?  ? ? ? Arthroscopic Knee Ligament Repair, Care After ?This sheet gives you information about how to care for yourself after your procedure. Your health care provider may also give you more specific instructions. If you have problems or questions, contact your health care provider. ?What can I expect after the procedure? ?After the procedure, it is common to have: ?Soreness or pain in your knee. ?Bruising and swelling on your knee, calf, and ankle for 3-4 days. ?A small amount of fluid coming from the incisions. ?Follow these instructions at home: ?Medicines ?Take over-the-counter and prescription medicines only as told by your health care provider. ?Ask your health care provider if the medicine prescribed to you: ?Requires you to avoid driving or using machinery. ?Can cause constipation. You may need to take these actions to prevent or treat constipation: ?Drink enough fluid to keep your urine pale yellow. ?Take over-the-counter or prescription medicines. ?Eat foods that are high in fiber, such as beans, whole grains, and fresh fruits and vegetables. ?Limit foods that are high in fat and processed sugars, such as fried or sweet foods. ?If you have a brace or immobilizer: ?Wear it as told by your health care provider. Remove it only as told by your health care provider. ?Loosen it if your toes tingle,  become numb, or turn cold and blue. ?Keep it clean and dry. ?Ask your health care provider when it is safe to drive. ?Bathing ?Do not take baths, swim, or use a hot tub until your health care provider approves. ?Keep your bandage (dressing) dry until your health care provider says that it can be removed. ?If the brace or immobilizer is not waterproof: ?Do not let it get wet. ?Cover it with a watertight covering when you take a bath or shower. ?Incision care ? ?Follow instructions from your health care provider about how to take care of your incisions. Make sure you: ?Wash your hands with soap  and water for at least 20 seconds before and after you change your dressing. If soap and water are not available, use hand sanitizer. ?Change your dressing as told by your health care provider. ?Leave stitches (sutures), skin glue, or adhesive strips in place. These skin closures may need to stay in place for 2 weeks or longer. If adhesive strip edges start to loosen and curl up, you may trim the loose edges. Do not remove adhesive strips completely unless your health care provider tells you to do that. ?Check your incision areas every day for signs of infection. Check for: ?Redness. ?More swelling or pain. ?Blood or more fluid. ?Warmth. ?Pus or a bad smell. ?Managing pain, stiffness, and swelling ? ?If directed, put ice on the affected area. To do this: ?If you have a removable brace or immobilizer, remove it as told by your health care provider. ?Put ice in a plastic bag. ?Place a towel between your skin and the bag. ?Leave the ice on for 20 minutes, 2-3 times a day. ?Remove the ice if your skin turns bright red. This is very important. If you cannot feel pain, heat, or cold, you have a greater risk of damage to the area. ?Move your toes often to reduce stiffness and swelling. ?Raise (elevate) the injured area above the level of your heart while you are sitting or lying down. ?Activity ?Do not use your knee to support your body weight until your health care provider says that you can. Use crutches or other devices as told by your health care provider. ?Do physical therapy exercises as told by your health care provider. Physical therapy will help you regain movement and strength in your knee. ?Follow instructions from your health care provider about: ?When you may start motion exercises. ?When you may start riding a stationary bike and doing other low-impact activities. ?When you may start to jog and do other high-impact activities. ?Do not lift anything that is heavier than 10 lb (4.5 kg), or the limit that you  are told, until your health care provider says that it is safe. ?Ask your health care provider what activities are safe for you. ?General instructions ?Do not use any products that contain nicotine or tobacco, such as cigarettes, e-cigarettes, and chewing tobacco. These can delay healing. If you need help quitting, ask your health care provider. ?Wear compression stockings as told by your health care provider. These stockings help to prevent blood clots and reduce swelling in your legs. ?Keep all follow-up visits. This is important. ?Contact a health care provider if: ?You have any of these signs of infection: ?Redness around an incision. ?Blood or more fluid coming from an incision. ?Warmth coming from an incision. ?Pus or a bad smell coming from an incision. ?More swelling or pain in your knee. ?A fever or chills. ?You have pain that does not get  better with medicine. ?Your incision opens up. ?Get help right away if: ?You have trouble breathing. ?You have chest pain. ?You have increased pain or swelling in your calf or at the back of your knee. ?You have numbness and tingling near the knee joint or in the foot, ankle, or toes. ?You notice that your foot or toes look darker than normal or are cooler than normal. ?These symptoms may represent a serious problem that is an emergency. Do not wait to see if the symptoms will go away. Get medical help right away. Call your local emergency services (911 in the [place] Do not drive yourself to the hospital. ?Summary ?After the procedure, it is common to have knee pain with bruising and swelling on your knee, calf, and ankle. ?Icing your knee and raising your leg above the level of your heart will help control the pain and swelling. ?Do physical therapy exercises as told by your health care provider. Physical therapy will help you regain movement and strength in your knee. ?This information is not intended to replace advice given to you by your health care provider. Make  sure you discuss any questions you have with your health care provider. ?Document Revised: 03/08/2020 Document Reviewed: 03/08/2020 ?Elsevier Patient Education ? 2022 [employer] ?General Anesthesia, Adult, C

## 2022-01-31 ENCOUNTER — Encounter (HOSPITAL_COMMUNITY)
Admission: RE | Admit: 2022-01-31 | Discharge: 2022-01-31 | Disposition: A | Discharge status: Home / Self Care | Payer: 59 | Source: Ambulatory Visit | Attending: Orthopedic Surgery | Admitting: Orthopedic Surgery

## 2022-01-31 ENCOUNTER — Encounter (HOSPITAL_COMMUNITY): Payer: Self-pay

## 2022-01-31 ENCOUNTER — Other Ambulatory Visit: Payer: Self-pay

## 2022-01-31 VITALS — BP 119/80 | HR 70 | Temp 97.7°F | Resp 18 | Ht 69.0 in | Wt 277.0 lb

## 2022-01-31 DIAGNOSIS — Z01818 Encounter for other preprocedural examination: Secondary | ICD-10-CM

## 2022-01-31 DIAGNOSIS — Z01812 Encounter for preprocedural laboratory examination: Secondary | ICD-10-CM | POA: Diagnosis not present

## 2022-01-31 DIAGNOSIS — S83241A Other tear of medial meniscus, current injury, right knee, initial encounter: Secondary | ICD-10-CM | POA: Diagnosis not present

## 2022-01-31 LAB — CBC
HCT: 44.9 % (ref 36.0–46.0)
Hemoglobin: 14.6 g/dL (ref 12.0–15.0)
MCH: 28.4 pg (ref 26.0–34.0)
MCHC: 32.5 g/dL (ref 30.0–36.0)
MCV: 87.4 fL (ref 80.0–100.0)
Platelets: 201 10*3/uL (ref 150–400)
RBC: 5.14 MIL/uL — ABNORMAL HIGH (ref 3.87–5.11)
RDW: 16.7 % — ABNORMAL HIGH (ref 11.5–15.5)
WBC: 6.4 10*3/uL (ref 4.0–10.5)
nRBC: 0 % (ref 0.0–0.2)

## 2022-01-31 LAB — BASIC METABOLIC PANEL
Anion gap: 8 (ref 5–15)
BUN: 16 mg/dL (ref 6–20)
CO2: 27 mmol/L (ref 22–32)
Calcium: 8.9 mg/dL (ref 8.9–10.3)
Chloride: 105 mmol/L (ref 98–111)
Creatinine, Ser: 0.73 mg/dL (ref 0.44–1.00)
GFR, Estimated: >60 mL/min (ref >60)
Glucose, Bld: 93 mg/dL (ref 70–99)
Potassium: 4.2 mmol/L (ref 3.5–5.1)
Sodium: 140 mmol/L (ref 135–145)

## 2022-01-31 LAB — HCG, SERUM, QUALITATIVE: Preg, Serum: NEGATIVE

## 2022-02-01 NOTE — H&P (Signed)
Outpatient surgery history and physical for right knee surgery ? ? ?Chief Complaint  ?Patient presents with  ? Knee Pain  ?    Right, chronic had a twisting injury heard a pop DOI 9/432  ?  ?51 year old female last September was moving a sandbag preparation for her cane twisted her knee felt a pop complains of medial knee pain and loss of range of motion and giving way ?  ?She took over-the-counter medications ice tried to deal with it on her own no prior treatment ? ?Review of systems negative had left knee arthroscopy for similar problem a year or 2 ago ? ?Physical exam ? ?General appearance well-developed well-nourished grooming hygiene normal height 5 9 weight 125 kg BMI 40.91 ? ?She is oriented x3 ? ?Pleasant mood normal affect ? ?Gait slightly abnormal ? ?Exam right knee cannot extend the knee has pain with attempted extension flexes to about 110 degrees then has pain small effusion medial joint line tenderness ligaments stable neurovascular exam intact ?  ?    ?Past Medical History:  ?Diagnosis Date  ? Anxiety    ? Depression    ? Hot flashes 11/26/2014  ? Mood swings 11/26/2014  ?  ?     ?Past Surgical History:  ?Procedure Laterality Date  ? CHOLECYSTECTOMY      ? KNEE SURGERY Left    ?  Arthroscopy  ?  ?Family History  ?Problem Relation Age of Onset  ? Anxiety disorder Mother   ? Stroke Father   ? Hypertension Father   ? Diabetes Father   ? Other Father   ?     has a pacemaker  ? Anxiety disorder Sister   ? Depression Sister   ? Hyperlipidemia Daughter   ? Cancer Maternal Grandmother   ? Diabetes Maternal Grandmother   ? Stroke Maternal Grandmother   ? Heart disease Maternal Grandmother   ? Kidney disease Maternal Grandmother   ? Breast cancer Maternal Grandmother   ? Cancer Maternal Grandfather   ? Emphysema Paternal Grandmother   ? Stroke Paternal Grandmother   ? Heart disease Paternal Grandmother   ? Diabetes Paternal Grandmother   ? Other Paternal Grandfather   ?     "black lung"  ? ?Social History   ? ?Tobacco Use  ? Smoking status: Every Day  ?  Packs/day: 0.50  ?  Types: Cigarettes  ? Smokeless tobacco: Never  ?Vaping Use  ? Vaping Use: Never used  ?Substance Use Topics  ? Alcohol use: No  ? Drug use: No  ? ? ?  ?  ?Current Outpatient Medications:  ?  acetaminophen (TYLENOL) 500 MG tablet, Take 1,000 mg by mouth every 6 (six) hours as needed for mild pain or headache., Disp: , Rfl:  ?  ALPRAZolam (XANAX) 1 MG tablet, Take 0.5-1 mg by mouth 2 (two) times daily as needed for anxiety. , Disp: , Rfl:  ?  celecoxib (CELEBREX) 200 MG capsule, Take 200 mg by mouth 2 (two) times daily., Disp: , Rfl:  ?  DULoxetine (CYMBALTA) 60 MG capsule, Take 60 mg by mouth daily., Disp: , Rfl:  ?  ibuprofen (ADVIL,MOTRIN) 200 MG tablet, Take 600 mg by mouth 2 (two) times daily as needed for cramping., Disp: , Rfl:  ?  ?X-rays ?Patellofemoral arthritis mild there are some osteophytes in the lateral facet area tibiofemoral alignment is about 4 degrees ?  ?    ?Encounter Diagnoses  ?Name Primary?  ? Chronic pain of right knee Yes  ?  Acute medial meniscus tear of right knee, initial encounter    ?  ?MRI right knee ? CLINICAL DATA:  Acute right knee pain ?  ?EXAM: ?MRI OF THE RIGHT KNEE WITHOUT CONTRAST ?  ?TECHNIQUE: ?Multiplanar, multisequence MR imaging of the knee was performed. No ?intravenous contrast was administered. ?  ?COMPARISON:  None. ?  ?FINDINGS: ?MENISCI ?  ?Medial: Radial tear of the posterior horn of the medial meniscus ?with peripheral meniscal extrusion. Degeneration of the body of the ?medial meniscus. ?  ?Lateral: Intact ?  ?LIGAMENTS ?  ?Cruciates: ACL and PCL are intact. ?  ?Collaterals: Medial collateral ligament is intact. Lateral ?collateral ligament complex is intact. ?  ?CARTILAGE ?  ?Patellofemoral: Partial-thickness cartilage loss of the patellar ?apex extending into the medial and lateral patellar facets. ?Partial-thickness cartilage loss of the trochlea. ?  ?Medial: High-grade partial-thickness  cartilage loss with areas of ?full-thickness cartilage loss of the medial femorotibial ?compartment. ?  ?Lateral: Mild partial-thickness cartilage loss of the lateral ?femorotibial compartment. ?  ?JOINT: Moderate joint effusion. Normal Hoffa's fat-pad. No plical ?thickening. Small ganglion cyst along the posterior aspect of the ?joint capsule. ?  ?POPLITEAL FOSSA: Popliteus tendon is intact. No Baker's cyst. ?  ?EXTENSOR MECHANISM: Intact quadriceps tendon. Intact patellar ?tendon. Intact lateral patellar retinaculum. Intact medial patellar ?retinaculum. Intact MPFL. ?  ?BONES: No aggressive osseous lesion. No fracture or dislocation. ?  ?Other: No fluid collection or hematoma. Muscles are normal. ?  ?IMPRESSION: ?1. Radial tear of the posterior horn of the medial meniscus with ?peripheral meniscal extrusion. Degeneration of the body of the ?medial meniscus. ?2. Tricompartmental cartilage abnormalities as described above most ?severe in the medial femorotibial compartment. ?3. Moderate joint effusion. ?  ?  ?Electronically Signed ?  By: Elige Ko M.D. ?  On: 01/13/2022 18:36 ? ? ?Assessment and plan ? ?The patient has arthritis in all 3 compartments with some areas of high-grade partial-thickness cartilage loss and full-thickness cartilage loss as well as a posterior horn medial meniscal tear with degeneration of the meniscus ? ?She is also overweight and this will affect her postop course ? ?However, she does have specific signs for the meniscal tear and this should be improved with arthroscopic surgery so we are planning to do a ? ?Arthroscopy of the left knee with partial medial meniscectomy ? ?The procedure has been fully reviewed with the patient; The risks and benefits of surgery have been discussed and explained and understood. Alternative treatment has also been reviewed, questions were encouraged and answered. The postoperative plan is also been reviewed. ?-There is high likelihood of arthritis symptoms  in this knee ? ?-There is also a likelihood of prolonged and protracted recovery more than the normal 3 weeks. ?

## 2022-02-02 ENCOUNTER — Ambulatory Visit (HOSPITAL_COMMUNITY): Payer: 59 | Admitting: Anesthesiology

## 2022-02-02 ENCOUNTER — Ambulatory Visit (HOSPITAL_BASED_OUTPATIENT_CLINIC_OR_DEPARTMENT_OTHER): Payer: 59 | Admitting: Anesthesiology

## 2022-02-02 ENCOUNTER — Other Ambulatory Visit: Payer: Self-pay

## 2022-02-02 ENCOUNTER — Encounter (HOSPITAL_COMMUNITY)
Admission: RE | Disposition: A | Discharge status: Home / Self Care | Payer: Self-pay | Source: Home / Self Care | Attending: Orthopedic Surgery

## 2022-02-02 ENCOUNTER — Telehealth: Payer: Self-pay | Admitting: Orthopedic Surgery

## 2022-02-02 ENCOUNTER — Encounter (HOSPITAL_COMMUNITY): Payer: Self-pay | Admitting: Orthopedic Surgery

## 2022-02-02 ENCOUNTER — Ambulatory Visit (HOSPITAL_COMMUNITY)
Admission: RE | Admit: 2022-02-02 | Discharge: 2022-02-02 | Disposition: A | Discharge status: Home / Self Care | Payer: 59 | Attending: Orthopedic Surgery | Admitting: Orthopedic Surgery

## 2022-02-02 DIAGNOSIS — M94261 Chondromalacia, right knee: Secondary | ICD-10-CM

## 2022-02-02 DIAGNOSIS — M23321 Other meniscus derangements, posterior horn of medial meniscus, right knee: Secondary | ICD-10-CM

## 2022-02-02 DIAGNOSIS — F17210 Nicotine dependence, cigarettes, uncomplicated: Secondary | ICD-10-CM | POA: Insufficient documentation

## 2022-02-02 DIAGNOSIS — I082 Rheumatic disorders of both aortic and tricuspid valves: Secondary | ICD-10-CM | POA: Diagnosis not present

## 2022-02-02 DIAGNOSIS — F418 Other specified anxiety disorders: Secondary | ICD-10-CM | POA: Diagnosis not present

## 2022-02-02 DIAGNOSIS — S83241A Other tear of medial meniscus, current injury, right knee, initial encounter: Secondary | ICD-10-CM

## 2022-02-02 DIAGNOSIS — F32A Depression, unspecified: Secondary | ICD-10-CM | POA: Diagnosis not present

## 2022-02-02 DIAGNOSIS — X501XXA Overexertion from prolonged static or awkward postures, initial encounter: Secondary | ICD-10-CM | POA: Insufficient documentation

## 2022-02-02 DIAGNOSIS — Z6841 Body Mass Index (BMI) 40.0 and over, adult: Secondary | ICD-10-CM | POA: Diagnosis not present

## 2022-02-02 DIAGNOSIS — F419 Anxiety disorder, unspecified: Secondary | ICD-10-CM | POA: Diagnosis not present

## 2022-02-02 DIAGNOSIS — M2241 Chondromalacia patellae, right knee: Secondary | ICD-10-CM | POA: Diagnosis not present

## 2022-02-02 DIAGNOSIS — M1711 Unilateral primary osteoarthritis, right knee: Secondary | ICD-10-CM | POA: Insufficient documentation

## 2022-02-02 HISTORY — PX: KNEE ARTHROSCOPY WITH MEDIAL MENISECTOMY: SHX5651

## 2022-02-02 HISTORY — PX: CHONDROPLASTY: SHX5177

## 2022-02-02 SURGERY — ARTHROSCOPY, KNEE, WITH MEDIAL MENISCECTOMY
Anesthesia: General | Site: Knee | Laterality: Right

## 2022-02-02 MED ORDER — FENTANYL CITRATE (PF) 100 MCG/2ML IJ SOLN
INTRAMUSCULAR | Status: AC
  Filled 2022-02-02: qty 2

## 2022-02-02 MED ORDER — OXYCODONE HCL 5 MG PO TABS: 5.0000 mg | ORAL_TABLET | Freq: Once | ORAL | Status: DC

## 2022-02-02 MED ORDER — EPINEPHRINE PF 1 MG/ML IJ SOLN
INTRAMUSCULAR | Status: DC | PRN
  Administered 2022-02-02 (×2): 3000 mL

## 2022-02-02 MED ORDER — ONDANSETRON HCL 4 MG/2ML IJ SOLN
INTRAMUSCULAR | Status: DC | PRN
  Administered 2022-02-02: 4 mg via INTRAVENOUS

## 2022-02-02 MED ORDER — LIDOCAINE HCL (PF) 2 % IJ SOLN
INTRAMUSCULAR | Status: AC
  Filled 2022-02-02: qty 5

## 2022-02-02 MED ORDER — MIDAZOLAM HCL 2 MG/2ML IJ SOLN
INTRAMUSCULAR | Status: AC
Start: 2022-02-02 — End: ?
  Filled 2022-02-02: qty 2

## 2022-02-02 MED ORDER — DEXMEDETOMIDINE HCL IN NACL 80 MCG/20ML IV SOLN
INTRAVENOUS | Status: AC
  Filled 2022-02-02: qty 20

## 2022-02-02 MED ORDER — PROPOFOL 10 MG/ML IV BOLUS
INTRAVENOUS | Status: DC | PRN
  Administered 2022-02-02: 70 mg via INTRAVENOUS
  Administered 2022-02-02: 200 mg via INTRAVENOUS
  Administered 2022-02-02: 30 mg via INTRAVENOUS

## 2022-02-02 MED ORDER — ONDANSETRON HCL 4 MG/2ML IJ SOLN
INTRAMUSCULAR | Status: AC
  Filled 2022-02-02: qty 2

## 2022-02-02 MED ORDER — MIDAZOLAM HCL 2 MG/2ML IJ SOLN
INTRAMUSCULAR | Status: DC | PRN
  Administered 2022-02-02: 2 mg via INTRAVENOUS

## 2022-02-02 MED ORDER — IBUPROFEN 800 MG PO TABS: 800.0000 mg | ORAL_TABLET | Freq: Three times a day (TID) | ORAL | 1 refills | Status: AC | PRN

## 2022-02-02 MED ORDER — ONDANSETRON HCL 4 MG/2ML IJ SOLN: 4.0000 mg | Freq: Once | INTRAMUSCULAR | Status: DC

## 2022-02-02 MED ORDER — KETOROLAC TROMETHAMINE 30 MG/ML IJ SOLN
INTRAMUSCULAR | Status: AC
  Filled 2022-02-02: qty 1

## 2022-02-02 MED ORDER — ORAL CARE MOUTH RINSE: 15.0000 mL | Freq: Once | OROMUCOSAL | Status: AC

## 2022-02-02 MED ORDER — BUPIVACAINE-EPINEPHRINE (PF) 0.5% -1:200000 IJ SOLN
INTRAMUSCULAR | Status: AC
  Filled 2022-02-02: qty 60

## 2022-02-02 MED ORDER — HYDROMORPHONE HCL 1 MG/ML IJ SOLN: 0.2500 mg | INTRAMUSCULAR | Status: DC | PRN

## 2022-02-02 MED ORDER — MEPERIDINE HCL 50 MG/ML IJ SOLN: 6.2500 mg | INTRAMUSCULAR | Status: DC | PRN

## 2022-02-02 MED ORDER — ROCURONIUM BROMIDE 10 MG/ML (PF) SYRINGE
PREFILLED_SYRINGE | INTRAVENOUS | Status: AC
  Filled 2022-02-02: qty 10

## 2022-02-02 MED ORDER — LACTATED RINGERS IV SOLN: INTRAVENOUS | Status: DC

## 2022-02-02 MED ORDER — FENTANYL CITRATE (PF) 250 MCG/5ML IJ SOLN
INTRAMUSCULAR | Status: DC | PRN
  Administered 2022-02-02 (×4): 50 ug via INTRAVENOUS

## 2022-02-02 MED ORDER — SODIUM CHLORIDE 0.9 % IR SOLN
Status: DC | PRN
  Administered 2022-02-02: 1000 mL

## 2022-02-02 MED ORDER — VANCOMYCIN HCL IN DEXTROSE 1-5 GM/200ML-% IV SOLN
INTRAVENOUS | Status: AC
  Filled 2022-02-02: qty 200

## 2022-02-02 MED ORDER — ONDANSETRON HCL 4 MG/2ML IJ SOLN: 4.0000 mg | Freq: Once | INTRAMUSCULAR | Status: DC | PRN

## 2022-02-02 MED ORDER — ROCURONIUM BROMIDE 10 MG/ML (PF) SYRINGE
PREFILLED_SYRINGE | INTRAVENOUS | Status: DC | PRN
  Administered 2022-02-02: 50 mg via INTRAVENOUS

## 2022-02-02 MED ORDER — HYDROCODONE-ACETAMINOPHEN 5-325 MG PO TABS: 1.0000 | ORAL_TABLET | ORAL | 0 refills | Status: AC | PRN

## 2022-02-02 MED ORDER — CHLORHEXIDINE GLUCONATE 0.12 % MT SOLN
15.0000 mL | Freq: Once | OROMUCOSAL | Status: AC
  Administered 2022-02-02: 15 mL via OROMUCOSAL

## 2022-02-02 MED ORDER — EPINEPHRINE PF 1 MG/ML IJ SOLN
INTRAMUSCULAR | Status: AC
  Filled 2022-02-02: qty 6

## 2022-02-02 MED ORDER — SUGAMMADEX SODIUM 200 MG/2ML IV SOLN
INTRAVENOUS | Status: DC | PRN
  Administered 2022-02-02: 200 mg via INTRAVENOUS

## 2022-02-02 MED ORDER — DEXMEDETOMIDINE (PRECEDEX) IN NS 20 MCG/5ML (4 MCG/ML) IV SYRINGE
PREFILLED_SYRINGE | INTRAVENOUS | Status: DC | PRN
  Administered 2022-02-02 (×2): 12 ug via INTRAVENOUS

## 2022-02-02 MED ORDER — LIDOCAINE 2% (20 MG/ML) 5 ML SYRINGE
INTRAMUSCULAR | Status: DC | PRN
Start: 2022-02-02 — End: 2022-02-02
  Administered 2022-02-02: 100 mg via INTRAVENOUS

## 2022-02-02 MED ORDER — KETOROLAC TROMETHAMINE 30 MG/ML IJ SOLN
INTRAMUSCULAR | Status: DC | PRN
  Administered 2022-02-02: 30 mg via INTRAVENOUS

## 2022-02-02 MED ORDER — DEXAMETHASONE SODIUM PHOSPHATE 4 MG/ML IJ SOLN
INTRAMUSCULAR | Status: DC | PRN
  Administered 2022-02-02: 5 mg via INTRAVENOUS

## 2022-02-02 MED ORDER — PROPOFOL 10 MG/ML IV BOLUS
INTRAVENOUS | Status: AC
  Filled 2022-02-02: qty 20

## 2022-02-02 MED ORDER — BUPIVACAINE-EPINEPHRINE (PF) 0.5% -1:200000 IJ SOLN
INTRAMUSCULAR | Status: DC | PRN
Start: 2022-02-02 — End: 2022-02-02
  Administered 2022-02-02: 30 mL

## 2022-02-02 MED ORDER — DEXAMETHASONE SODIUM PHOSPHATE 10 MG/ML IJ SOLN
INTRAMUSCULAR | Status: AC
  Filled 2022-02-02: qty 1

## 2022-02-02 MED ORDER — ACETAMINOPHEN 500 MG PO TABS: 500.0000 mg | ORAL_TABLET | Freq: Once | ORAL | Status: DC

## 2022-02-02 MED ORDER — VANCOMYCIN HCL 1500 MG/300ML IV SOLN
1500.0000 mg | INTRAVENOUS | Status: AC
  Administered 2022-02-02: 1500 mg via INTRAVENOUS
  Filled 2022-02-02 (×2): qty 300

## 2022-02-02 SURGICAL SUPPLY — 52 items
APL PRP STRL LF DISP 70% ISPRP (MISCELLANEOUS) ×1
BAG HAMPER (MISCELLANEOUS) ×3 IMPLANT
BANDAGE ELASTIC 6 VELCRO NS (GAUZE/BANDAGES/DRESSINGS) ×1 IMPLANT
BLADE SHAVER TORPEDO 4X13 (MISCELLANEOUS) ×1 IMPLANT
BLADE SURG SZ11 CARB STEEL (BLADE) ×3 IMPLANT
BNDG CMPR STD VLCR NS LF 5.8X6 (GAUZE/BANDAGES/DRESSINGS) ×1
BNDG ELASTIC 6X5.8 VLCR NS LF (GAUZE/BANDAGES/DRESSINGS) ×3 IMPLANT
CHLORAPREP W/TINT 26 (MISCELLANEOUS) ×3 IMPLANT
CLOTH BEACON ORANGE TIMEOUT ST (SAFETY) ×3 IMPLANT
COOLER ICEMAN CLASSIC (MISCELLANEOUS) ×3 IMPLANT
DECANTER SPIKE VIAL GLASS SM (MISCELLANEOUS) ×5 IMPLANT
DRAPE HALF SHEET 40X57 (DRAPES) ×3 IMPLANT
GAUZE 4X4 16PLY ~~LOC~~+RFID DBL (SPONGE) ×3 IMPLANT
GAUZE SPONGE 4X4 12PLY STRL (GAUZE/BANDAGES/DRESSINGS) ×3 IMPLANT
GAUZE SPONGE 4X4 16PLY XRAY LF (GAUZE/BANDAGES/DRESSINGS) ×3 IMPLANT
GAUZE XEROFORM 5X9 LF (GAUZE/BANDAGES/DRESSINGS) ×3 IMPLANT
GLOVE BIO SURGEON STRL SZ 6.5 (GLOVE) ×1 IMPLANT
GLOVE BIOGEL PI IND STRL 7.0 (GLOVE) IMPLANT
GLOVE BIOGEL PI IND STRL 8.5 (GLOVE) IMPLANT
GLOVE BIOGEL PI INDICATOR 7.0 (GLOVE) ×3
GLOVE BIOGEL PI INDICATOR 8.5 (GLOVE) ×1
GLOVE ECLIPSE 6.5 STRL STRAW (GLOVE) ×1 IMPLANT
GLOVE SKINSENSE NS SZ8.0 LF (GLOVE) ×1
GLOVE SKINSENSE STRL SZ8.0 LF (GLOVE) IMPLANT
GLOVE SS N UNI LF 8.5 STRL (GLOVE) ×3 IMPLANT
GOWN STRL REUS W/TWL LRG LVL3 (GOWN DISPOSABLE) ×3 IMPLANT
GOWN STRL REUS W/TWL XL LVL3 (GOWN DISPOSABLE) ×3 IMPLANT
IV NS IRRIG 3000ML ARTHROMATIC (IV SOLUTION) ×6 IMPLANT
KIT BLADEGUARD II DBL (SET/KITS/TRAYS/PACK) ×3 IMPLANT
KIT TURNOVER CYSTO (KITS) ×3 IMPLANT
MANIFOLD NEPTUNE II (INSTRUMENTS) ×3 IMPLANT
MARKER SKIN DUAL TIP RULER LAB (MISCELLANEOUS) ×3 IMPLANT
NDL HYPO 21X1.5 SAFETY (NEEDLE) ×2 IMPLANT
NDL SPNL 18GX3.5 QUINCKE PK (NEEDLE) ×2 IMPLANT
NEEDLE HYPO 21X1.5 SAFETY (NEEDLE) ×2 IMPLANT
NEEDLE SPNL 18GX3.5 QUINCKE PK (NEEDLE) ×2 IMPLANT
NS IRRIG 1000ML POUR BTL (IV SOLUTION) ×1 IMPLANT
PACK ARTHRO LIMB DRAPE STRL (MISCELLANEOUS) ×3 IMPLANT
PAD ABD 5X9 TENDERSORB (GAUZE/BANDAGES/DRESSINGS) ×3 IMPLANT
PAD ARMBOARD 7.5X6 YLW CONV (MISCELLANEOUS) ×3 IMPLANT
PAD COLD SHLDR WRAP-ON (PAD) ×1 IMPLANT
PAD FOR LEG HOLDER (MISCELLANEOUS) ×3 IMPLANT
PADDING CAST COTTON 6X4 STRL (CAST SUPPLIES) ×3 IMPLANT
PORT APPOLLO RF 90DEGREE MULTI (SURGICAL WAND) ×1 IMPLANT
SET ARTHROSCOPY INST (INSTRUMENTS) ×3 IMPLANT
SET BASIN LINEN APH (SET/KITS/TRAYS/PACK) ×3 IMPLANT
SPONGE GAUZE 4X4 12PLY (GAUZE/BANDAGES/DRESSINGS) ×1 IMPLANT
SUT ETHILON 3 0 FSL (SUTURE) ×3 IMPLANT
SYR 10ML LL (SYRINGE) ×3 IMPLANT
SYR 30ML LL (SYRINGE) ×3 IMPLANT
TUBE CONNECTING 12X1/4 (SUCTIONS) ×5 IMPLANT
TUBING IN/OUT FLOW W/MAIN PUMP (TUBING) ×3 IMPLANT

## 2022-02-02 NOTE — Interval H&P Note (Signed)
History and Physical Interval Note: ? ?02/02/2022 ?8:38 AM ? ?Alexandra Shaffer  has presented today for surgery, with the diagnosis of medial menisectomy.  The various methods of treatment have been discussed with the patient and family. After consideration of risks, benefits and other options for treatment, the patient has consented to  Procedure(s): ?KNEE ARTHROSCOPY WITH MEDIAL MENISECTOMY (Right) as a surgical intervention.  The patient's history has been reviewed, patient examined, no change in status, stable for surgery.  I have reviewed the patient's chart and labs.  Questions were answered to the patient's satisfaction.   ? ? ?Fuller [place] ? ? ?

## 2022-02-02 NOTE — Transfer of Care (Signed)
Immediate Anesthesia Transfer of Care Note ? ?Patient: Alexandra Shaffer ? ?Procedure(s) Performed: KNEE ARTHROSCOPY WITH MEDIAL MENISECTOMY (Right: Knee) ?CHONDROPLASTY (Right: Knee) ? ?Patient Location: PACU ? ?Anesthesia Type:General ? ?Level of Consciousness: awake, alert  and oriented ? ?Airway & Oxygen Therapy: Patient Spontanous Breathing and Patient connected to nasal cannula oxygen ? ?Post-op Assessment: Report given to RN and Post -op Vital signs reviewed and stable ? ?Post vital signs: Reviewed and stable ? ?Last Vitals:  ?Vitals Value Taken Time  ?BP    ?Temp    ?Pulse    ?Resp    ?SpO2    ? ? ?Last Pain:  ?Vitals:  ? 02/02/22 0717  ?TempSrc: Oral  ?PainSc: 3   ?   ? ?  ? ?Complications: No notable events documented. ?

## 2022-02-02 NOTE — Anesthesia Preprocedure Evaluation (Addendum)
Anesthesia Evaluation  ?Patient identified by MRN, date of birth, ID band ?Patient awake ? ? ? ?Reviewed: ?Allergy & Precautions, NPO status , Patient's Chart, lab work & pertinent test results ? ?Airway ?Mallampati: II ? ?TM Distance: >3 FB ?Neck ROM: Full ? ? ? Dental ? ?(+) Dental Advisory Given ?  ?Pulmonary ?Current Smoker and Patient abstained from smoking.,  ?  ?Pulmonary exam normal ?breath sounds clear to auscultation ? ? ? ? ? ? Cardiovascular ?negative cardio ROS ?Normal cardiovascular exam ?Rhythm:Regular Rate:Normal ? ?1. Left ventricular ejection fraction, by visual estimation, is 55 to 60%. The left ventricle has normal function. Normal left ventricular size. Left ventricular septal wall thickness was normal. Normal left ventricular posterior wall thickness. There is no left ventricular hypertrophy.  ??2. Global right ventricle has normal systolic function.The right ventricular size is normal. No increase in right ventricular wall thickness.  ??3. Left atrial size was normal.  ??4. Right atrial size was normal.  ??5. The mitral valve is grossly normal. Trace mitral valve regurgitation.  ??6. The tricuspid valve is grossly normal. Tricuspid valve regurgitation is mild.  ??7. The aortic valve is tricuspid Aortic valve regurgitation was not visualized by color flow Doppler. Structurally normal aortic valve, with no evidence of sclerosis or stenosis.  ??8. The pulmonic valve was grossly normal. Pulmonic valve regurgitation is not visualized by color flow Doppler.  ??9. Normal pulmonary artery systolic pressure.  ?10. The inferior vena cava is normal in size with greater than 50% respiratory variability, suggesting right atrial pressure of 3 mmHg.  ?  ?Neuro/Psych ?PSYCHIATRIC DISORDERS Anxiety Depression negative neurological ROS ?   ? GI/Hepatic ?Neg liver ROS, GERD (occasional, takes tums)  ,  ?Endo/Other  ?Morbid obesity ? Renal/GU ?negative Renal ROS  ?negative  genitourinary ?  ?Musculoskeletal ?negative musculoskeletal ROS ?(+)  ? Abdominal ?  ?Peds ?negative pediatric ROS ?(+)  Hematology ?negative hematology ROS ?(+)   ?Anesthesia Other Findings ? ? Reproductive/Obstetrics ?negative OB ROS ? ?  ? ? ? ? ? ? ? ? ? ? ? ? ? ?  ?  ? ? ? ? ? ?Anesthesia Physical ?Anesthesia Plan ? ?ASA: 3 ? ?Anesthesia Plan: General  ? ?Post-op Pain Management: Dilaudid IV  ? ?Induction: Intravenous ? ?PONV Risk Score and Plan: 3 ? ?Airway Management Planned: Oral ETT ? ?Additional Equipment:  ? ?Intra-op Plan:  ? ?Post-operative Plan: Extubation in OR ? ?Informed Consent: I have reviewed the patients History and Physical, chart, labs and discussed the procedure including the risks, benefits and alternatives for the proposed anesthesia with the patient or authorized representative who has indicated his/her understanding and acceptance.  ? ? ? ?Dental advisory given ? ?Plan Discussed with: CRNA and Surgeon ? ?Anesthesia Plan Comments:   ? ? ? ? ? ?Anesthesia Quick Evaluation ? ?

## 2022-02-02 NOTE — Telephone Encounter (Signed)
Rec surgery  ?

## 2022-02-02 NOTE — Op Note (Signed)
02/02/2022 ? ?9:52 AM ? ?PATIENT:  Alexandra Shaffer  51 y.o. female ? ?PRE-OPERATIVE DIAGNOSIS:  medial menisectomy ? ?POST-OPERATIVE DIAGNOSIS:  medial menisectomy ? ?PROCEDURE:  Procedure(s): ?KNEE ARTHROSCOPY WITH MEDIAL MENISECTOMY (Right) ?CHONDROPLASTY: MEDIAL FEMORAL CONDYLE. PATELLA MEDIAL FACET, TROCHLEA (Right) ? ?Operative findings ? 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 ?ACL and PCL intact ? ?Lateral compartment small 5 mm partial-thickness chondral defect lateral femoral condyle, normal lateral meniscus and tibial plateau ? ?Grade 3 lesion trochlea, grade 2 lesion medial facet of the patella ? ?The patient was identified in the preoperative holding area using 2 approved identification mechanisms. The chart was reviewed and updated. The surgical site was confirmed as right knee and marked with an indelible marker. ? ?The patient was taken to the operating room for anesthesia. After successful general anesthesia, vancomycin 1500 mg based on BMI of 40.91 was used as IV antibiotics. ? ?The patient was placed in the supine position with the (right) the operative extremity in an arthroscopic leg holder and the opposite extremity in a padded leg holder. ? ?The timeout was executed. ? ?A lateral portal was established with an 11 blade and the scope was introduced into the joint. A diagnostic arthroscopy was performed in circumferential manner examining the entire knee joint. A medial portal was established and the diagnostic arthroscopy was repeated using a probe to palpate intra-articular structures as they were encountered.  ? ? ?The medial meniscus was resected using a duckbill forceps. The meniscal fragments were removed with a motorized shaver. The meniscus was balanced with a combination of a motorized shaver and a 90? ArthroCare wand until a stable rim was obtained. ? ?A separate portal was made with a spinal  needle ? ?A Chondroplasty was then performed with a straight motorized shaver using a torpedo shaver, we then performed the same on the medial meniscus and the same on the trochlea ? ?The arthroscopic pump was placed on the wash mode and any excess debris was removed from the joint using suction. ? ?60 cc of Marcaine with epinephrine was injected through the arthroscope. ? ?The portals were closed with 3-0 nylon suture. ? ?A sterile bandage, Ace wrap and Cryo/Cuff was placed and the Cryo/Cuff was activated. The patient was taken to the recovery room in stable condition. ? ?PHYSICIAN ASSISTANT: no ? ?ASSISTANTS: none  ? ?ANESTHESIA:   General ? ?EBL:  none  ? ?BLOOD ADMINISTERED:none ? ?DRAINS: none  ? ?LOCAL MEDICATIONS USED:  MARCAINE    ? ?SPECIMEN:  No Specimen ? ?DISPOSITION OF SPECIMEN:  N/A ? ?COUNTS:  YES ? ? ?DICTATION: .Dragon Dictation ? ?PLAN OF CARE: Discharge to home after PACU ? ?PATIENT DISPOSITION:  PACU - hemodynamically stable. ?  ?Delay start of Pharmacological VTE agent (>24hrs) due to surgical blood loss or risk of bleeding: not applicable  ?SURGEON:  Surgeon(s) and Role: ?   Vickki Hearing, MD - Primary ? ?PHYSICIAN ASSISTANT:  ? ?ASSISTANTS: none  ? ?ANESTHESIA:   general ? ?EBL:  NONE  ? ?BLOOD ADMINISTERED:none ? ?DRAINS: none  ? ?LOCAL MEDICATIONS USED:  MARCAINE    ? ?SPECIMEN:  No Specimen ? ?DISPOSITION OF SPECIMEN:  N/A ? ?COUNTS:  YES ? ?TOURNIQUET:  * No tourniquets in log * ? ?DICTATION: .Dragon Dictation ? ?PLAN OF CARE: Discharge to home after PACU ? ?PATIENT DISPOSITION:  PACU - hemodynamically stable. ?  ?Delay start of Pharmacological VTE agent (>24hrs) due to  surgical blood loss or risk of bleeding: not applicable ? ?

## 2022-02-02 NOTE — Anesthesia Postprocedure Evaluation (Signed)
Anesthesia Post Note ? ?Patient: Grissel Tyrell ? ?Procedure(s) Performed: KNEE ARTHROSCOPY WITH MEDIAL MENISECTOMY (Right: Knee) ?CHONDROPLASTY (Right: Knee) ? ?Patient location during evaluation: Phase II ?Anesthesia Type: General ?Level of consciousness: awake and alert and oriented ?Pain management: pain level controlled ?Vital Signs Assessment: post-procedure vital signs reviewed and stable ?Respiratory status: spontaneous breathing, nonlabored ventilation and respiratory function stable ?Cardiovascular status: blood pressure returned to baseline and stable ?Postop Assessment: no apparent nausea or vomiting ?Anesthetic complications: no ? ? ?No notable events documented. ? ? ?Last Vitals:  ?Vitals:  ? 02/02/22 1030 02/02/22 1044  ?BP: 117/76 124/78  ?Pulse: (!) 59 64  ?Resp: 17 16  ?Temp:  36.7 ?C  ?SpO2: 100% 97%  ?  ?Last Pain:  ?Vitals:  ? 02/02/22 1044  ?TempSrc: Oral  ?PainSc: 0-No pain  ? ? ?  ?  ?  ?  ?  ?  ? ?Dillie Burandt C Lalisa Kiehn ? ? ? ? ?

## 2022-02-02 NOTE — Anesthesia Procedure Notes (Signed)
Procedure Name: Intubation ?Date/Time: 02/02/2022 8:54 AM ?Performed by: Orlie Dakin, CRNA ?Pre-anesthesia Checklist: Patient identified, Emergency Drugs available, Patient being monitored and Suction available ?Patient Re-evaluated:Patient Re-evaluated prior to induction ?Oxygen Delivery Method: Circle system utilized ?Preoxygenation: Pre-oxygenation with 100% oxygen ?Induction Type: IV induction ?Ventilation: Mask ventilation without difficulty ?Laryngoscope Size: Sabra Heck and 3 ?Grade View: Grade II ?Tube type: Oral ?Tube size: 7.0 mm ?Number of attempts: 1 ?Airway Equipment and Method: Stylet ?Placement Confirmation: ETT inserted through vocal cords under direct vision, positive ETCO2 and breath sounds checked- equal and bilateral ?Secured at: 23 cm ?Tube secured with: Tape ?Dental Injury: Teeth and Oropharynx as per pre-operative assessment  ?Comments: Coughing on DL. Oral airway placed for bite block. ? ? ? ? ?

## 2022-02-02 NOTE — Brief Op Note (Signed)
02/02/2022 ? ?9:52 AM ? ?PATIENT:  Alexandra Shaffer  51 y.o. female ? ?PRE-OPERATIVE DIAGNOSIS:  medial menisectomy ? ?POST-OPERATIVE DIAGNOSIS:  medial menisectomy ? ?PROCEDURE:  Procedure(s): ?KNEE ARTHROSCOPY WITH MEDIAL MENISECTOMY (Right) ?CHONDROPLASTY: MEDIAL FEMORAL CONDYLE. PATELLA MEDIAL FACET, TROCHLEA (Right) ? ?SURGEON:  Surgeon(s) and Role: ?   Vickki Hearing, MD - Primary ? ?PHYSICIAN ASSISTANT:  ? ?ASSISTANTS: none  ? ?ANESTHESIA:   general ? ?EBL:  NONE  ? ?BLOOD ADMINISTERED:none ? ?DRAINS: none  ? ?LOCAL MEDICATIONS USED:  MARCAINE    ? ?SPECIMEN:  No Specimen ? ?DISPOSITION OF SPECIMEN:  N/A ? ?COUNTS:  YES ? ?TOURNIQUET:  * No tourniquets in log * ? ?DICTATION: .Dragon Dictation ? ?PLAN OF CARE: Discharge to home after PACU ? ?PATIENT DISPOSITION:  PACU - hemodynamically stable. ?  ?Delay start of Pharmacological VTE agent (>24hrs) due to surgical blood loss or risk of bleeding: not applicable ? ?

## 2022-02-03 ENCOUNTER — Telehealth: Payer: Self-pay

## 2022-02-03 NOTE — Telephone Encounter (Signed)
Not sure of the name of the mutual group. Ladie stated they need the following information: ?ICD-10 Code for the disability ?Date of out of work due to the disability ?What are her current restrictions ?What is the current treatment plan ?What is the estimated recovery date. ? ?I only gave the 1st visit date and the next appointment date. ? ?Please call [phone]    Case # [phone]  ?

## 2022-02-06 NOTE — Telephone Encounter (Signed)
I called [employer] and s/w rep Boneta Lucks, and asked them to fax [place] a signed release and request for the info needed.  ?

## 2022-02-08 ENCOUNTER — Ambulatory Visit (INDEPENDENT_AMBULATORY_CARE_PROVIDER_SITE_OTHER): Payer: 59 | Admitting: Orthopedic Surgery

## 2022-02-08 DIAGNOSIS — Z9889 Other specified postprocedural states: Secondary | ICD-10-CM

## 2022-02-08 NOTE — Progress Notes (Signed)
FOLLOW UP  ? ?Encounter Diagnosis  ?Name Primary?  ? S/P right knee arthroscopy, 02/02/2022 Yes  ? ? ? ?Chief Complaint  ?Patient presents with  ? Routine Post Op  ?  RT knee/ post scope  ?DOS 02/02/22  ? ? ? ?Postop day 6 status post arthroscopy of the right knee ? ?Procedure knee arthroscopy with medial meniscectomy and chondroplasty medial femoral condyle patella medial facet and trochlea ? ?Operative findings medial compartment grade II chondromalacia medial femoral condyle radial tear posterior horn medial meniscus near the root, ACL PCL intact, lateral compartment small 5 mm partial-thickness chondral defect lateral femoral condyle normal lateral meniscus and tibial plateau ? ?Grade 3 lesion trochlea and grade 2 lesion medial facet of patella ? ?The patient is doing well she is got her knee bent to 90 degrees she can extend it she is using little to no assistive devices ? ?She has a slight limp ? ?We took her sutures out ? ?She can begin exercises at home and follow-up in 3 to 4 weeks ?

## 2022-02-16 ENCOUNTER — Other Ambulatory Visit (HOSPITAL_COMMUNITY): Payer: Self-pay | Admitting: Family Medicine

## 2022-02-16 DIAGNOSIS — Z1231 Encounter for screening mammogram for malignant neoplasm of breast: Secondary | ICD-10-CM

## 2022-02-20 ENCOUNTER — Ambulatory Visit (HOSPITAL_COMMUNITY): Payer: 59

## 2022-02-27 ENCOUNTER — Ambulatory Visit (HOSPITAL_COMMUNITY)
Admission: RE | Admit: 2022-02-27 | Discharge: 2022-02-27 | Disposition: A | Discharge status: Home / Self Care | Payer: 59 | Source: Ambulatory Visit | Attending: Family Medicine | Admitting: Family Medicine

## 2022-02-27 DIAGNOSIS — Z1231 Encounter for screening mammogram for malignant neoplasm of breast: Secondary | ICD-10-CM | POA: Diagnosis present

## 2022-02-27 IMAGING — MG MM DIGITAL SCREENING BILAT W/ TOMO AND CAD
8 series · 8 of 24 positions shown · non-contrast
Comparison: Previous exam(s).

CLINICAL DATA: Screening.

EXAM:
DIGITAL SCREENING BILATERAL MAMMOGRAM WITH TOMOSYNTHESIS AND CAD
TECHNIQUE: Bilateral screening digital craniocaudal and mediolateral oblique
mammograms were obtained. Bilateral screening digital breast
tomosynthesis was performed. The images were evaluated with
computer-aided detection.

[L CC synth-2D]
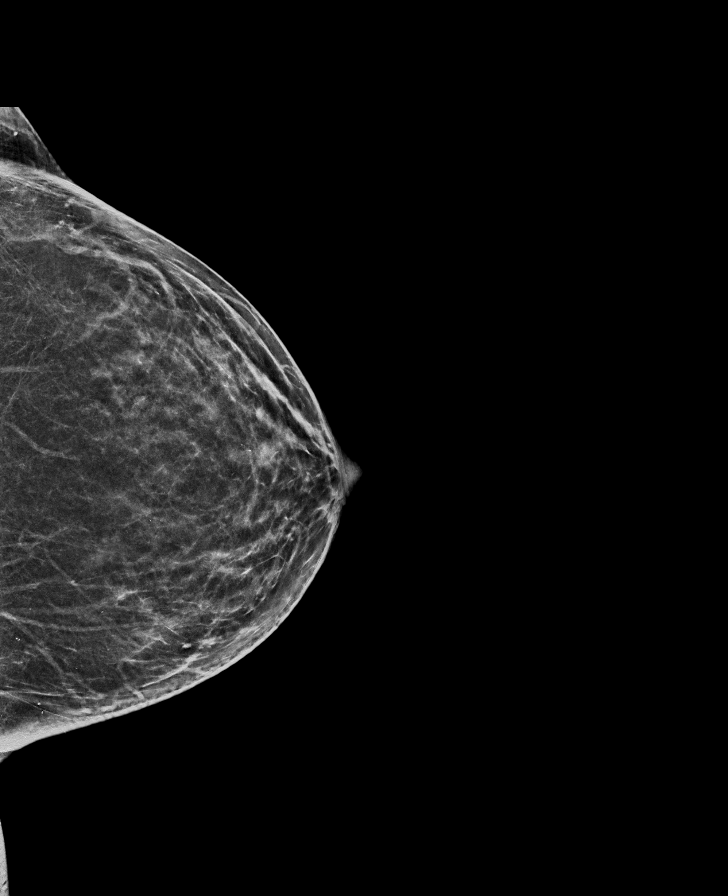

[R CC synth-2D]
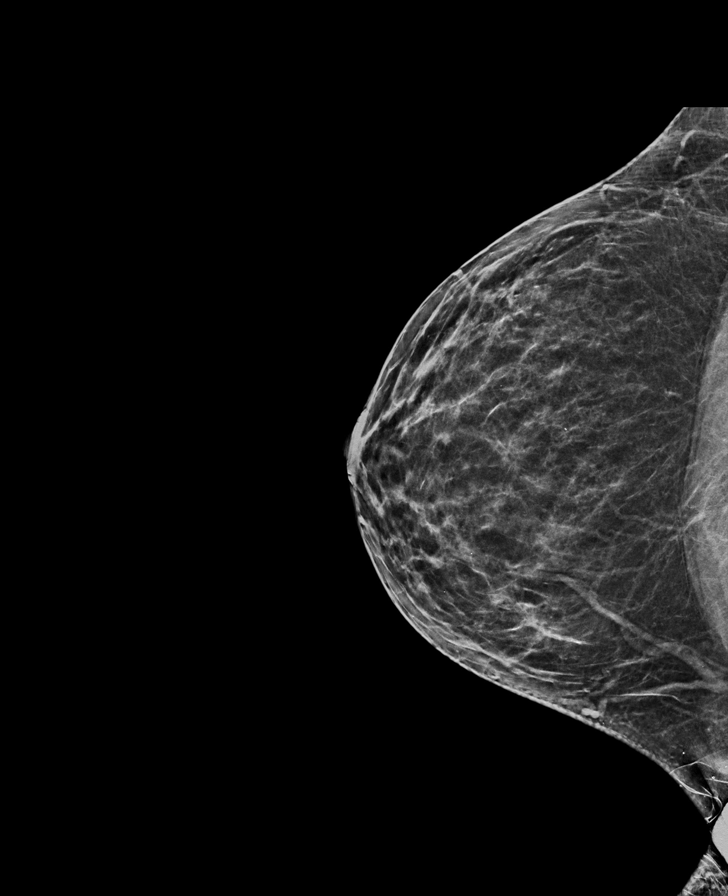

[L MLO synth-2D]
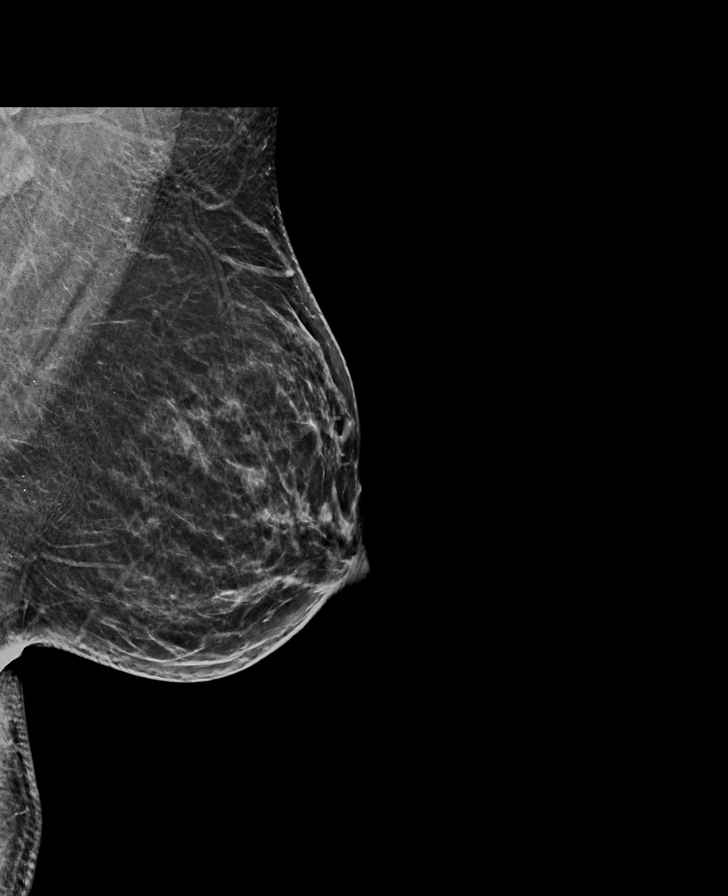

[R MLO synth-2D]
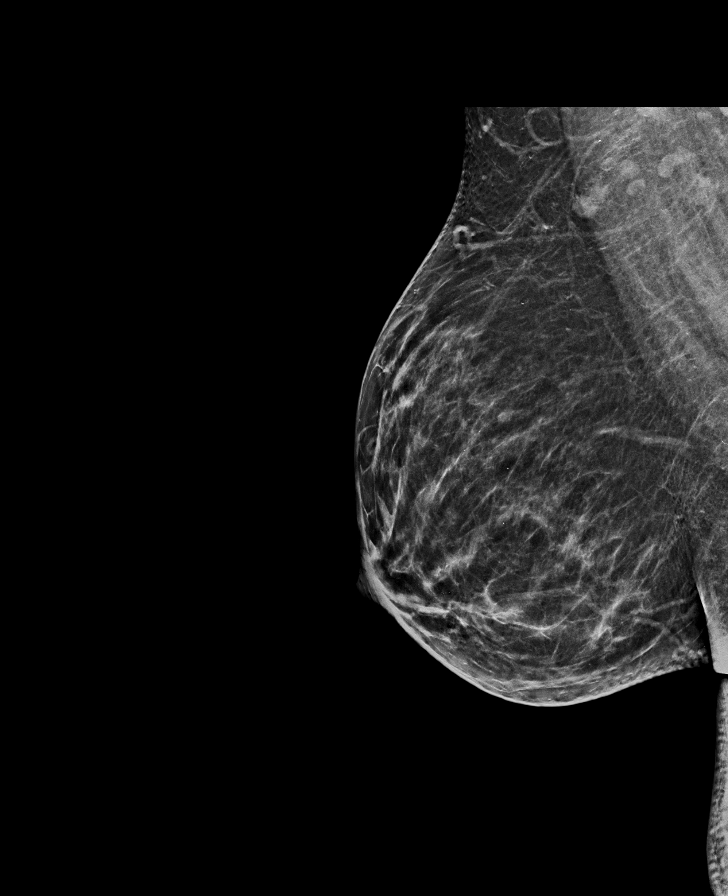

[R CC tomo · tomo slice 31/62.0]
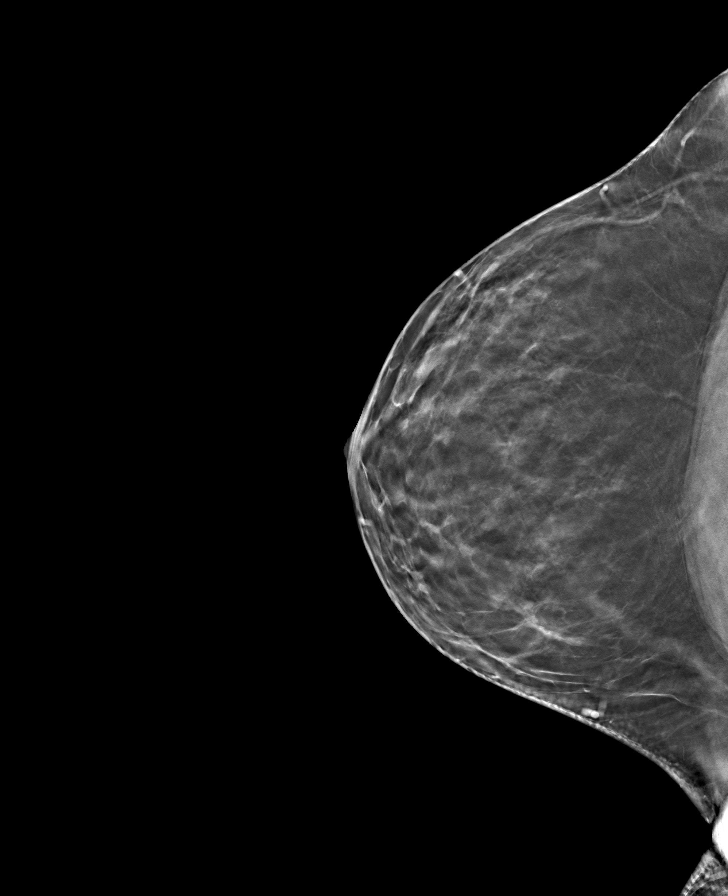

[L MLO tomo · tomo slice 33/66.0]
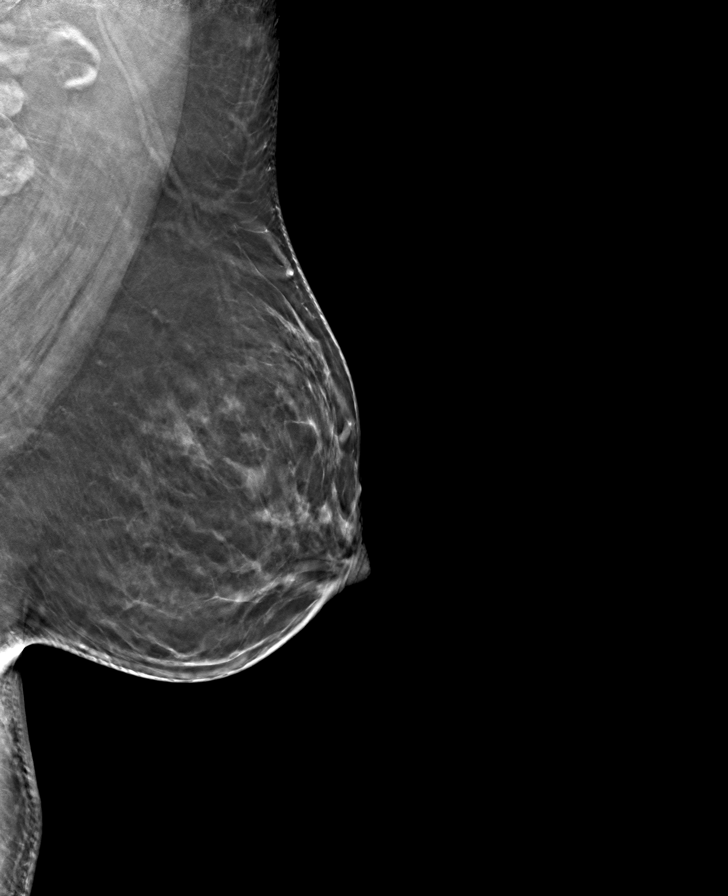

[L CC tomo · tomo slice 31/61.0]
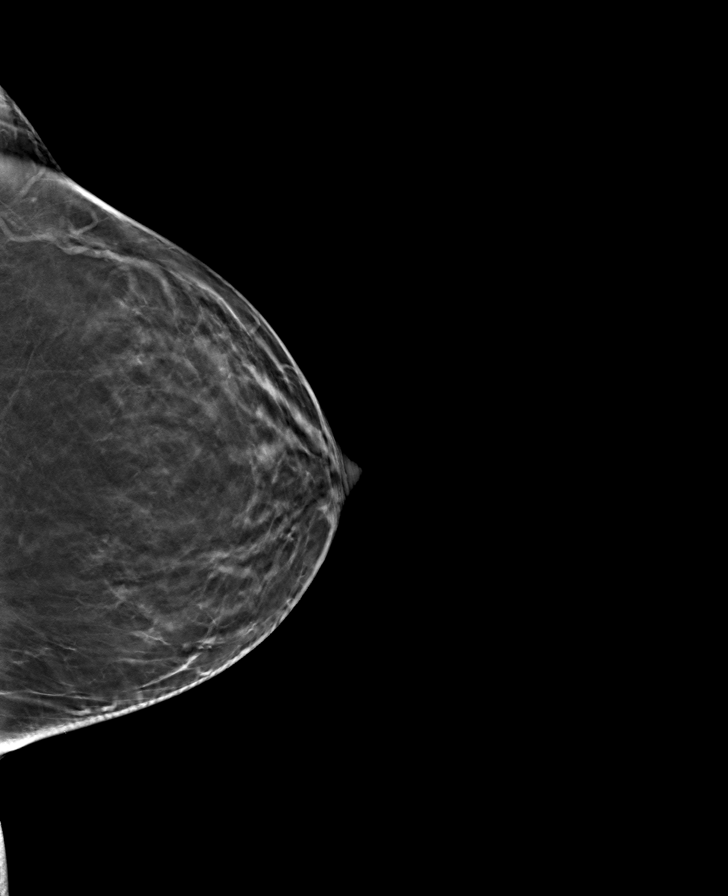

[R MLO tomo · tomo slice 33/65.0]
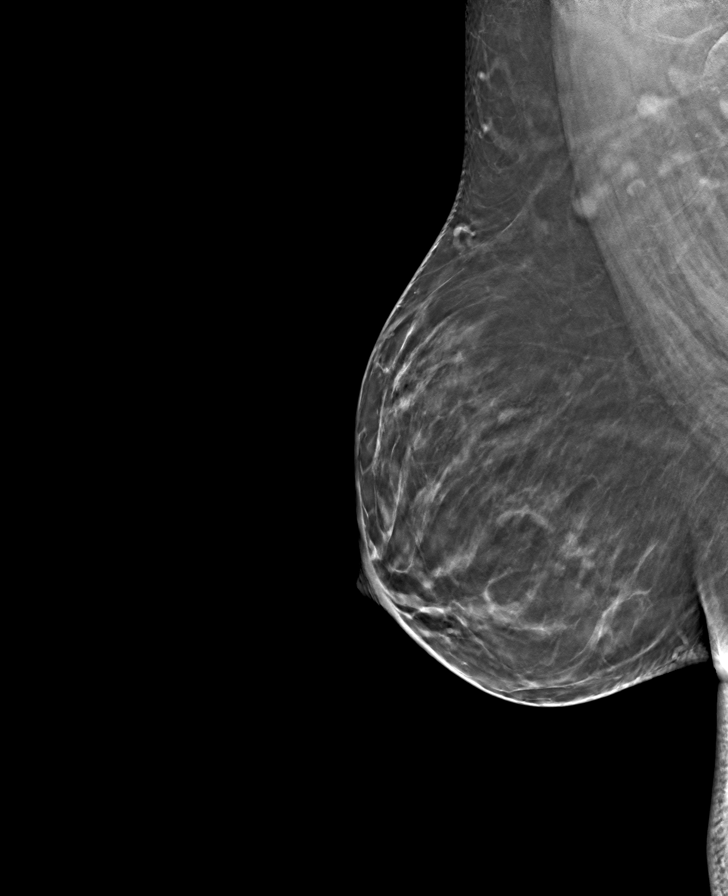

[8 of 24 positions shown; findings below may reference images not displayed]

ACR Breast Density Category b: There are scattered areas of
fibroglandular density.
FINDINGS: There are no findings suspicious for malignancy.
IMPRESSION: No mammographic evidence of malignancy. A result letter of this
screening mammogram will be mailed directly to the patient.

RECOMMENDATION:
Screening mammogram in one year. (Code:[BY])

BI-RADS CATEGORY  1: Negative.

## 2022-03-08 ENCOUNTER — Ambulatory Visit: Payer: 59 | Admitting: Adult Health

## 2022-03-09 ENCOUNTER — Encounter: Payer: Self-pay | Admitting: Orthopedic Surgery

## 2022-03-09 ENCOUNTER — Ambulatory Visit (INDEPENDENT_AMBULATORY_CARE_PROVIDER_SITE_OTHER): Payer: 59 | Admitting: Orthopedic Surgery

## 2022-03-09 VITALS — Ht 69.0 in | Wt 277.0 lb

## 2022-03-09 DIAGNOSIS — M1711 Unilateral primary osteoarthritis, right knee: Secondary | ICD-10-CM

## 2022-03-09 DIAGNOSIS — M23203 Derangement of unspecified medial meniscus due to old tear or injury, right knee: Secondary | ICD-10-CM

## 2022-03-09 DIAGNOSIS — Z9889 Other specified postprocedural states: Secondary | ICD-10-CM

## 2022-03-09 MED ORDER — MELOXICAM 7.5 MG PO TABS: 7.5000 mg | ORAL_TABLET | Freq: Every day | ORAL | 5 refills | Status: AC

## 2022-03-09 NOTE — Patient Instructions (Signed)
Ice at the end of the day for 30 mins  Continue home exercises   Wear TED hose  Start meloxicam as directed

## 2022-03-09 NOTE — Progress Notes (Signed)
Chief Complaint  Patient presents with   Routine Post Op    Rt knee DOS 02/02/22, pt c/o swelling under the knee and pain along the bone after standing for over 30 mins and it gets progressively worse.    Body mass index is 40.91 kg/m.  Alexandra Shaffer is improving slightly and slowly  She can now extend her knee with a little bit of quad weakness I do not detect an effusion she can bend the knee well she is having some pain after standing and is over the medial femoral condyle  We did discuss some arthritis medicine should be started and we will go ahead and do that today  She should ice the knee at the end of the day try to lose a little bit of weight continue her exercises to get her quadricep strength better  She will follow-up if this does not seem to help  Meds ordered this encounter  Medications   meloxicam (MOBIC) 7.5 MG tablet    Sig: Take 1 tablet (7.5 mg total) by mouth daily.    Dispense:  30 tablet    Refill:  5    Encounter Diagnoses  Name Primary?   S/P right knee arthroscopy, 02/02/2022 Yes   Degenerative tear of medial meniscus of right knee    Primary osteoarthritis of right knee

## 2022-03-10 ENCOUNTER — Encounter: Payer: 59 | Admitting: Orthopedic Surgery

## 2022-04-03 ENCOUNTER — Ambulatory Visit: Payer: 59 | Admitting: Obstetrics & Gynecology

## 2022-06-09 DIAGNOSIS — K219 Gastro-esophageal reflux disease without esophagitis: Secondary | ICD-10-CM | POA: Diagnosis not present

## 2022-06-09 DIAGNOSIS — R1310 Dysphagia, unspecified: Secondary | ICD-10-CM | POA: Diagnosis not present

## 2022-06-09 DIAGNOSIS — K625 Hemorrhage of anus and rectum: Secondary | ICD-10-CM | POA: Diagnosis not present

## 2022-06-09 DIAGNOSIS — K5900 Constipation, unspecified: Secondary | ICD-10-CM | POA: Diagnosis not present

## 2022-06-20 DIAGNOSIS — R69 Illness, unspecified: Secondary | ICD-10-CM | POA: Diagnosis not present

## 2022-07-03 DIAGNOSIS — U071 COVID-19: Secondary | ICD-10-CM | POA: Diagnosis not present

## 2022-07-27 DIAGNOSIS — J069 Acute upper respiratory infection, unspecified: Secondary | ICD-10-CM | POA: Diagnosis not present

## 2022-09-13 DIAGNOSIS — K644 Residual hemorrhoidal skin tags: Secondary | ICD-10-CM | POA: Diagnosis not present

## 2022-09-13 DIAGNOSIS — K2289 Other specified disease of esophagus: Secondary | ICD-10-CM | POA: Diagnosis not present

## 2022-09-13 DIAGNOSIS — K259 Gastric ulcer, unspecified as acute or chronic, without hemorrhage or perforation: Secondary | ICD-10-CM | POA: Diagnosis not present

## 2022-09-13 DIAGNOSIS — K449 Diaphragmatic hernia without obstruction or gangrene: Secondary | ICD-10-CM | POA: Diagnosis not present

## 2022-09-13 DIAGNOSIS — K209 Esophagitis, unspecified without bleeding: Secondary | ICD-10-CM | POA: Diagnosis not present

## 2022-09-13 DIAGNOSIS — K625 Hemorrhage of anus and rectum: Secondary | ICD-10-CM | POA: Diagnosis not present

## 2022-09-13 DIAGNOSIS — K648 Other hemorrhoids: Secondary | ICD-10-CM | POA: Diagnosis not present

## 2022-09-13 DIAGNOSIS — R1319 Other dysphagia: Secondary | ICD-10-CM | POA: Diagnosis not present

## 2022-09-13 DIAGNOSIS — K222 Esophageal obstruction: Secondary | ICD-10-CM | POA: Diagnosis not present

## 2022-09-13 DIAGNOSIS — K2930 Chronic superficial gastritis without bleeding: Secondary | ICD-10-CM | POA: Diagnosis not present

## 2022-11-07 DIAGNOSIS — F321 Major depressive disorder, single episode, moderate: Secondary | ICD-10-CM | POA: Diagnosis not present

## 2022-11-07 DIAGNOSIS — Z6841 Body Mass Index (BMI) 40.0 and over, adult: Secondary | ICD-10-CM | POA: Diagnosis not present

## 2022-11-07 DIAGNOSIS — I824Z2 Acute embolism and thrombosis of unspecified deep veins of left distal lower extremity: Secondary | ICD-10-CM | POA: Diagnosis not present

## 2022-11-07 DIAGNOSIS — F419 Anxiety disorder, unspecified: Secondary | ICD-10-CM | POA: Diagnosis not present

## 2022-11-16 DIAGNOSIS — J111 Influenza due to unidentified influenza virus with other respiratory manifestations: Secondary | ICD-10-CM | POA: Diagnosis not present

## 2022-12-11 ENCOUNTER — Encounter: Payer: Self-pay | Admitting: Orthopedic Surgery

## 2022-12-11 ENCOUNTER — Ambulatory Visit (INDEPENDENT_AMBULATORY_CARE_PROVIDER_SITE_OTHER): Payer: Self-pay | Admitting: Orthopedic Surgery

## 2022-12-11 DIAGNOSIS — M7651 Patellar tendinitis, right knee: Secondary | ICD-10-CM

## 2022-12-11 MED ORDER — PREDNISONE 10 MG PO TABS: 10.0000 mg | ORAL_TABLET | Freq: Three times a day (TID) | ORAL | 0 refills | Status: AC

## 2022-12-11 NOTE — Progress Notes (Signed)
Chief Complaint  Patient presents with   Knee Pain    Right  Scope 02/02/22    Alexandra Shaffer had arthroscopy in 2023 she had some arthritis in her knee as well as a medial meniscus tear she did well until about 2 months ago when her knee started hurting in the front.  It radiates down the shin when the knee is hurting.  All of the pain is anteriorly.  She says she did move and has a second story apartment which requires her to climb a flight of stairs daily or when she goes in and out of the house  Other than that no aggravating incidents  She does take meloxicam once a day for the arthritis but it does not seem to help the knee  Examination as follows  Right knee skin looks good there is no effusion.  The shin is not tender at this time.  She does have tenderness in the patellar tendon and some in the quadriceps tendon.  The joint lines are nontender her range of motion is normal her knee is stable.  Impression patellar tendinitis  Recommend stopping meloxicam for 2 weeks taking prednisone for 2 weeks using ice nightly and capsaicin  Follow-up if no improvement  Encounter Diagnosis  Name Primary?   Patellar tendonitis of right knee Yes   Meds ordered this encounter  Medications   predniSONE (DELTASONE) 10 MG tablet    Sig: Take 1 tablet (10 mg total) by mouth 3 (three) times daily.    Dispense:  42 tablet    Refill:  0

## 2022-12-16 DIAGNOSIS — R070 Pain in throat: Secondary | ICD-10-CM | POA: Diagnosis not present

## 2022-12-21 ENCOUNTER — Encounter: Payer: Self-pay | Admitting: Radiology

## 2023-02-21 DIAGNOSIS — Z20828 Contact with and (suspected) exposure to other viral communicable diseases: Secondary | ICD-10-CM | POA: Diagnosis not present

## 2023-02-21 DIAGNOSIS — R6889 Other general symptoms and signs: Secondary | ICD-10-CM | POA: Diagnosis not present

## 2023-02-21 DIAGNOSIS — J069 Acute upper respiratory infection, unspecified: Secondary | ICD-10-CM | POA: Diagnosis not present

## 2023-02-21 DIAGNOSIS — F419 Anxiety disorder, unspecified: Secondary | ICD-10-CM | POA: Diagnosis not present

## 2023-02-21 DIAGNOSIS — Z6841 Body Mass Index (BMI) 40.0 and over, adult: Secondary | ICD-10-CM | POA: Diagnosis not present

## 2023-04-04 DIAGNOSIS — H6992 Unspecified Eustachian tube disorder, left ear: Secondary | ICD-10-CM | POA: Diagnosis not present

## 2023-04-04 DIAGNOSIS — H6502 Acute serous otitis media, left ear: Secondary | ICD-10-CM | POA: Diagnosis not present

## 2023-04-18 DIAGNOSIS — R6889 Other general symptoms and signs: Secondary | ICD-10-CM | POA: Diagnosis not present

## 2023-04-18 DIAGNOSIS — S239XXA Sprain of unspecified parts of thorax, initial encounter: Secondary | ICD-10-CM | POA: Diagnosis not present

## 2023-04-18 DIAGNOSIS — R7309 Other abnormal glucose: Secondary | ICD-10-CM | POA: Diagnosis not present

## 2023-04-18 DIAGNOSIS — Z780 Asymptomatic menopausal state: Secondary | ICD-10-CM | POA: Diagnosis not present

## 2023-04-18 DIAGNOSIS — F419 Anxiety disorder, unspecified: Secondary | ICD-10-CM | POA: Diagnosis not present

## 2023-04-18 DIAGNOSIS — R5383 Other fatigue: Secondary | ICD-10-CM | POA: Diagnosis not present

## 2023-04-18 DIAGNOSIS — M549 Dorsalgia, unspecified: Secondary | ICD-10-CM | POA: Diagnosis not present

## 2023-04-18 DIAGNOSIS — I824Z2 Acute embolism and thrombosis of unspecified deep veins of left distal lower extremity: Secondary | ICD-10-CM | POA: Diagnosis not present

## 2023-04-18 DIAGNOSIS — Z1331 Encounter for screening for depression: Secondary | ICD-10-CM | POA: Diagnosis not present

## 2023-04-18 DIAGNOSIS — Z6841 Body Mass Index (BMI) 40.0 and over, adult: Secondary | ICD-10-CM | POA: Diagnosis not present

## 2023-04-18 DIAGNOSIS — E782 Mixed hyperlipidemia: Secondary | ICD-10-CM | POA: Diagnosis not present

## 2023-04-18 DIAGNOSIS — F321 Major depressive disorder, single episode, moderate: Secondary | ICD-10-CM | POA: Diagnosis not present

## 2023-04-18 DIAGNOSIS — Z0001 Encounter for general adult medical examination with abnormal findings: Secondary | ICD-10-CM | POA: Diagnosis not present

## 2023-05-17 DIAGNOSIS — F419 Anxiety disorder, unspecified: Secondary | ICD-10-CM | POA: Diagnosis not present

## 2023-05-17 DIAGNOSIS — Z6841 Body Mass Index (BMI) 40.0 and over, adult: Secondary | ICD-10-CM | POA: Diagnosis not present

## 2023-06-13 DIAGNOSIS — U071 COVID-19: Secondary | ICD-10-CM | POA: Diagnosis not present

## 2023-07-10 DIAGNOSIS — H6502 Acute serous otitis media, left ear: Secondary | ICD-10-CM | POA: Diagnosis not present

## 2023-07-10 DIAGNOSIS — Z20828 Contact with and (suspected) exposure to other viral communicable diseases: Secondary | ICD-10-CM | POA: Diagnosis not present

## 2023-07-10 DIAGNOSIS — Z6841 Body Mass Index (BMI) 40.0 and over, adult: Secondary | ICD-10-CM | POA: Diagnosis not present

## 2023-07-10 DIAGNOSIS — R6889 Other general symptoms and signs: Secondary | ICD-10-CM | POA: Diagnosis not present

## 2023-07-10 DIAGNOSIS — U099 Post covid-19 condition, unspecified: Secondary | ICD-10-CM | POA: Diagnosis not present

## 2023-10-08 DIAGNOSIS — Z124 Encounter for screening for malignant neoplasm of cervix: Secondary | ICD-10-CM | POA: Diagnosis not present

## 2023-10-08 DIAGNOSIS — N3281 Overactive bladder: Secondary | ICD-10-CM | POA: Diagnosis not present

## 2023-10-08 DIAGNOSIS — Z01419 Encounter for gynecological examination (general) (routine) without abnormal findings: Secondary | ICD-10-CM | POA: Diagnosis not present

## 2023-10-31 DIAGNOSIS — S56911A Strain of unspecified muscles, fascia and tendons at forearm level, right arm, initial encounter: Secondary | ICD-10-CM | POA: Diagnosis not present

## 2023-10-31 DIAGNOSIS — S46911A Strain of unspecified muscle, fascia and tendon at shoulder and upper arm level, right arm, initial encounter: Secondary | ICD-10-CM | POA: Diagnosis not present

## 2023-11-15 DIAGNOSIS — N950 Postmenopausal bleeding: Secondary | ICD-10-CM | POA: Diagnosis not present

## 2023-12-17 DIAGNOSIS — J069 Acute upper respiratory infection, unspecified: Secondary | ICD-10-CM | POA: Diagnosis not present

## 2024-03-03 DIAGNOSIS — Z0110 Encounter for examination of ears and hearing without abnormal findings: Secondary | ICD-10-CM | POA: Diagnosis not present

## 2024-06-20 DIAGNOSIS — F321 Major depressive disorder, single episode, moderate: Secondary | ICD-10-CM | POA: Diagnosis not present

## 2024-06-20 DIAGNOSIS — Z6841 Body Mass Index (BMI) 40.0 and over, adult: Secondary | ICD-10-CM | POA: Diagnosis not present

## 2024-06-20 DIAGNOSIS — Z0001 Encounter for general adult medical examination with abnormal findings: Secondary | ICD-10-CM | POA: Diagnosis not present

## 2024-06-20 DIAGNOSIS — E782 Mixed hyperlipidemia: Secondary | ICD-10-CM | POA: Diagnosis not present

## 2024-06-20 DIAGNOSIS — F419 Anxiety disorder, unspecified: Secondary | ICD-10-CM | POA: Diagnosis not present

## 2024-07-02 DIAGNOSIS — Z0000 Encounter for general adult medical examination without abnormal findings: Secondary | ICD-10-CM | POA: Diagnosis not present

## 2024-07-04 DIAGNOSIS — Z23 Encounter for immunization: Secondary | ICD-10-CM | POA: Diagnosis not present

## 2024-07-21 DIAGNOSIS — Z23 Encounter for immunization: Secondary | ICD-10-CM | POA: Diagnosis not present

## 2024-08-18 DIAGNOSIS — N319 Neuromuscular dysfunction of bladder, unspecified: Secondary | ICD-10-CM | POA: Diagnosis not present

## 2024-08-18 DIAGNOSIS — Z299 Encounter for prophylactic measures, unspecified: Secondary | ICD-10-CM | POA: Diagnosis not present

## 2024-08-18 DIAGNOSIS — R12 Heartburn: Secondary | ICD-10-CM | POA: Diagnosis not present

## 2024-08-18 DIAGNOSIS — F419 Anxiety disorder, unspecified: Secondary | ICD-10-CM | POA: Diagnosis not present

## 2024-10-20 ENCOUNTER — Emergency Department (HOSPITAL_COMMUNITY)
Admission: EM | Admit: 2024-10-20 | Discharge: 2024-10-20 | Disposition: A | Discharge status: Home / Self Care | Payer: Self-pay | Attending: Emergency Medicine | Admitting: Emergency Medicine

## 2024-10-20 ENCOUNTER — Other Ambulatory Visit: Payer: Self-pay

## 2024-10-20 ENCOUNTER — Encounter (HOSPITAL_COMMUNITY): Payer: Self-pay

## 2024-10-20 DIAGNOSIS — Z720 Tobacco use: Secondary | ICD-10-CM | POA: Insufficient documentation

## 2024-10-20 DIAGNOSIS — R0602 Shortness of breath: Secondary | ICD-10-CM | POA: Insufficient documentation

## 2024-10-20 DIAGNOSIS — R509 Fever, unspecified: Secondary | ICD-10-CM | POA: Insufficient documentation

## 2024-10-20 DIAGNOSIS — M7918 Myalgia, other site: Secondary | ICD-10-CM | POA: Insufficient documentation

## 2024-10-20 DIAGNOSIS — R059 Cough, unspecified: Secondary | ICD-10-CM | POA: Insufficient documentation

## 2024-10-20 DIAGNOSIS — R519 Headache, unspecified: Secondary | ICD-10-CM | POA: Insufficient documentation

## 2024-10-20 DIAGNOSIS — J111 Influenza due to unidentified influenza virus with other respiratory manifestations: Secondary | ICD-10-CM

## 2024-10-20 DIAGNOSIS — R062 Wheezing: Secondary | ICD-10-CM | POA: Insufficient documentation

## 2024-10-20 MED ORDER — ALBUTEROL SULFATE HFA 108 (90 BASE) MCG/ACT IN AERS
2.0000 | INHALATION_SPRAY | Freq: Once | RESPIRATORY_TRACT | Status: AC
  Administered 2024-10-20: 2 via RESPIRATORY_TRACT
  Filled 2024-10-20: qty 6.7

## 2024-10-20 MED ORDER — DEXAMETHASONE 4 MG PO TABS
12.0000 mg | ORAL_TABLET | Freq: Once | ORAL | Status: AC
  Administered 2024-10-20: 12 mg via ORAL
  Filled 2024-10-20: qty 3

## 2024-10-20 NOTE — ED Provider Notes (Signed)
 " [place] EMERGENCY DEPARTMENT AT [facility] Provider Note   CSN: [id number] Arrival date & time: 10/20/24  9564     Patient presents with: Fever, Cough, and Shortness of Breath   Alexandra Shaffer is a 53 y.o. female.   The history is provided by the patient.  Patient with history of anxiety and ongoing tobacco use presents with flulike illness.  Patient reports over 3 days ago she started having fever, cough, body aches, headache and mild shortness of breath.  No chest pain.  No hemoptysis.  No recent travel. Patient denies any known history of COPD or asthma, but reports she will frequently have wheezing when she gets a viral illness Reports sick contacts at work who had influenza  She reports she was vaccinated for influenza Past Medical History:  Diagnosis Date   Anxiety    Depression    Hot flashes 11/26/2014   Mood swings 11/26/2014    Prior to Admission medications  Medication Sig Start Date End Date Taking? Authorizing Provider  acetaminophen  (TYLENOL ) 500 MG tablet Take 1,000 mg by mouth every 6 (six) hours as needed for mild pain or headache.    [provider]  ALPRAZolam SHEFFIELD) 1 MG tablet Take 1 mg by mouth 3 (three) times daily.    [provider]  DULoxetine (CYMBALTA) 60 MG capsule Take 60 mg by mouth daily.    [provider]  ibuprofen  (ADVIL ) 800 MG tablet Take 1 tablet (800 mg total) by mouth every 8 (eight) hours as needed. 02/02/22   Margrette Taft BRAVO, MD  meloxicam  (MOBIC ) 7.5 MG tablet Take 1 tablet (7.5 mg total) by mouth daily. 03/09/22   Margrette Taft BRAVO, MD  predniSONE  (DELTASONE ) 10 MG tablet Take 1 tablet (10 mg total) by mouth 3 (three) times daily. 12/11/22   Margrette Taft BRAVO, MD    Allergies: Codeine and Keflex [cephalexin]    Review of Systems  Constitutional:  Positive for chills and fatigue.  Respiratory:  Positive for cough.     Updated Vital Signs BP 105/84   Pulse 82   Temp 98.2 F (36.8 C)  (Oral)   Resp 17   Ht 1.778 m (5' 10)   Wt (!) 137 kg   SpO2 92%   BMI 43.33 kg/m   Physical Exam CONSTITUTIONAL: Well developed/well nourished HEAD: Normocephalic/atraumatic EYES: EOMI/PERRL ENMT: Mucous membranes moist, no stridor or drooling NECK: supple no meningeal signs CV: S1/S2 noted, no murmurs/rubs/gallops noted LUNGS: Scattered wheezing bilaterally, no crackles, no distress ABDOMEN: soft, nontender NEURO: Pt is awake/alert/appropriate, moves all extremitiesx4.  No facial droop.   SKIN: warm, color normal PSYCH: no abnormalities of mood noted, alert and oriented to situation  (all labs ordered are listed, but only abnormal results are displayed) Labs Reviewed - No data to display  EKG: None  Radiology: No results found.   Procedures   Medications Ordered in the ED  dexamethasone  (DECADRON ) tablet 12 mg (12 mg Oral Given 10/20/24 0508)  albuterol  (VENTOLIN  HFA) 108 (90 Base) MCG/ACT inhaler 2 puff (2 puffs Inhalation Given 10/20/24 0508)                                    Medical Decision Making Risk Prescription drug management.   This patient presents to the ED for concern of cough and fever, this involves an extensive number of treatment options, and is a complaint that carries  with it a high risk of complications and morbidity.  The differential diagnosis includes but is not limited to flu, RSV, pneumonia, COVID-19  Comorbidities that complicate the patient evaluation: Patients presentation is complicated by their history of anxiety  Social Determinants of Health: Patients ongoing tobacco use  increases the complexity of managing their presentation  Additional history obtained: Additional history obtained from spouse Records reviewed outpatient records reviewed  Medicines ordered and prescription drug management: I ordered medication including albuterol  for cough and wheeze Reevaluation of the patient after these medicines showed that the  patient    improved  Test Considered: Will defer viral testing due to patient's exposure to influenza, and overall symptoms.   Reevaluation: After the interventions noted above, I reevaluated the patient and found that they have :improved Wheezing is resolved after albuterol , and pulse ox is improved  Complexity of problems addressed: Patients presentation is most consistent with  acute, uncomplicated illness  Disposition: After consideration of the diagnostic results and the patients response to treatment,  I feel that the patent would benefit from discharge  .        Final diagnoses:  Influenza-like illness    ED Discharge Orders     None          Midge Golas, MD 10/20/24 0532  "

## 2024-10-20 NOTE — ED Triage Notes (Addendum)
 Patient come in POV for complaint of Fever, cough and shortness of breath that started 3 days ago.
